# Patient Record
Sex: Female | Born: 1950 | Race: White | Hispanic: No | Smoking: Former smoker
Health system: Southern US, Community
[De-identification: ages and names within clinical notes are randomized; demographics above are authoritative.]

## PROBLEM LIST (undated history)

## (undated) DIAGNOSIS — E119 Type 2 diabetes mellitus without complications: Secondary | ICD-10-CM

## (undated) DIAGNOSIS — F419 Anxiety disorder, unspecified: Secondary | ICD-10-CM

## (undated) DIAGNOSIS — C140 Malignant neoplasm of pharynx, unspecified: Secondary | ICD-10-CM

## (undated) DIAGNOSIS — E7800 Pure hypercholesterolemia, unspecified: Secondary | ICD-10-CM

## (undated) DIAGNOSIS — M1711 Unilateral primary osteoarthritis, right knee: Secondary | ICD-10-CM

## (undated) DIAGNOSIS — I10 Essential (primary) hypertension: Secondary | ICD-10-CM

## (undated) DIAGNOSIS — E785 Hyperlipidemia, unspecified: Secondary | ICD-10-CM

## (undated) DIAGNOSIS — N200 Calculus of kidney: Secondary | ICD-10-CM

## (undated) DIAGNOSIS — M79643 Pain in unspecified hand: Secondary | ICD-10-CM

## (undated) DIAGNOSIS — K589 Irritable bowel syndrome without diarrhea: Secondary | ICD-10-CM

## (undated) DIAGNOSIS — F329 Major depressive disorder, single episode, unspecified: Secondary | ICD-10-CM

## (undated) DIAGNOSIS — B029 Zoster without complications: Secondary | ICD-10-CM

## (undated) DIAGNOSIS — F32A Depression, unspecified: Secondary | ICD-10-CM

## (undated) DIAGNOSIS — K5790 Diverticulosis of intestine, part unspecified, without perforation or abscess without bleeding: Secondary | ICD-10-CM

## (undated) DIAGNOSIS — J449 Chronic obstructive pulmonary disease, unspecified: Secondary | ICD-10-CM

## (undated) HISTORY — DX: Unilateral primary osteoarthritis, right knee: M17.11

## (undated) HISTORY — PX: CARPAL TUNNEL RELEASE: SHX101

## (undated) HISTORY — DX: Hyperlipidemia, unspecified: E78.5

## (undated) HISTORY — DX: Pain in unspecified hand: M79.643

## (undated) HISTORY — DX: Malignant neoplasm of pharynx, unspecified: C14.0

## (undated) HISTORY — PX: THROAT SURGERY: SHX803

## (undated) HISTORY — DX: Depression, unspecified: F32.A

## (undated) HISTORY — PX: JEJUNOSTOMY FEEDING TUBE: SUR737

## (undated) HISTORY — PX: TOTAL ABDOMINAL HYSTERECTOMY: SHX209

## (undated) HISTORY — PX: CHOLECYSTECTOMY: SHX55

## (undated) HISTORY — DX: Major depressive disorder, single episode, unspecified: F32.9

## (undated) HISTORY — DX: Type 2 diabetes mellitus without complications: E11.9

---

## 2001-12-22 DIAGNOSIS — C140 Malignant neoplasm of pharynx, unspecified: Secondary | ICD-10-CM

## 2001-12-22 HISTORY — DX: Malignant neoplasm of pharynx, unspecified: C14.0

## 2004-07-25 ENCOUNTER — Ambulatory Visit (HOSPITAL_COMMUNITY): Admission: RE | Admit: 2004-07-25 | Discharge: 2004-07-25 | Payer: Self-pay | Admitting: Family Medicine

## 2004-11-05 ENCOUNTER — Ambulatory Visit: Payer: Self-pay | Admitting: Family Medicine

## 2004-11-18 ENCOUNTER — Ambulatory Visit: Payer: Self-pay | Admitting: Family Medicine

## 2004-11-19 ENCOUNTER — Ambulatory Visit (HOSPITAL_COMMUNITY): Admission: RE | Admit: 2004-11-19 | Discharge: 2004-11-19 | Payer: Self-pay | Admitting: Family Medicine

## 2004-12-09 ENCOUNTER — Ambulatory Visit: Payer: Self-pay | Admitting: Family Medicine

## 2005-01-28 ENCOUNTER — Ambulatory Visit: Payer: Self-pay | Admitting: Family Medicine

## 2005-03-11 ENCOUNTER — Ambulatory Visit: Payer: Self-pay | Admitting: Family Medicine

## 2005-04-07 ENCOUNTER — Ambulatory Visit: Payer: Self-pay | Admitting: Family Medicine

## 2005-04-24 ENCOUNTER — Ambulatory Visit: Payer: Self-pay | Admitting: Family Medicine

## 2005-05-28 ENCOUNTER — Ambulatory Visit: Payer: Self-pay | Admitting: Internal Medicine

## 2005-05-29 ENCOUNTER — Ambulatory Visit: Payer: Self-pay | Admitting: Family Medicine

## 2005-06-18 ENCOUNTER — Ambulatory Visit (HOSPITAL_COMMUNITY): Admission: RE | Admit: 2005-06-18 | Discharge: 2005-06-18 | Payer: Self-pay | Admitting: Internal Medicine

## 2005-06-18 ENCOUNTER — Ambulatory Visit: Payer: Self-pay | Admitting: Internal Medicine

## 2005-06-18 ENCOUNTER — Encounter (INDEPENDENT_AMBULATORY_CARE_PROVIDER_SITE_OTHER): Payer: Self-pay | Admitting: Internal Medicine

## 2005-08-26 ENCOUNTER — Ambulatory Visit: Payer: Self-pay | Admitting: Family Medicine

## 2005-10-06 ENCOUNTER — Ambulatory Visit: Payer: Self-pay | Admitting: Family Medicine

## 2006-01-20 ENCOUNTER — Ambulatory Visit: Payer: Self-pay | Admitting: Family Medicine

## 2006-04-29 ENCOUNTER — Ambulatory Visit: Payer: Self-pay | Admitting: Family Medicine

## 2006-05-06 ENCOUNTER — Ambulatory Visit: Payer: Self-pay | Admitting: Family Medicine

## 2006-06-18 ENCOUNTER — Ambulatory Visit: Payer: Self-pay | Admitting: Family Medicine

## 2006-09-24 ENCOUNTER — Ambulatory Visit: Payer: Self-pay | Admitting: Family Medicine

## 2007-01-18 ENCOUNTER — Encounter: Payer: Self-pay | Admitting: Family Medicine

## 2007-01-18 LAB — CONVERTED CEMR LAB
ALT: 14 units/L (ref 0–35)
AST: 19 units/L (ref 0–37)
Albumin: 4.3 g/dL (ref 3.5–5.2)
Alkaline Phosphatase: 48 units/L (ref 39–117)
BUN: 14 mg/dL (ref 6–23)
Basophils Absolute: 0.1 10*3/uL (ref 0.0–0.1)
Basophils Relative: 1 % (ref 0–1)
Bilirubin, Direct: 0.1 mg/dL (ref 0.0–0.3)
CO2: 29 meq/L (ref 19–32)
Calcium: 9.4 mg/dL (ref 8.4–10.5)
Chloride: 104 meq/L (ref 96–112)
Cholesterol: 159 mg/dL (ref 0–200)
Creatinine, Ser: 0.69 mg/dL (ref 0.40–1.20)
Eosinophils Absolute: 0.6 10*3/uL (ref 0.0–0.7)
Eosinophils Relative: 11 % — ABNORMAL HIGH (ref 0–5)
Glucose, Bld: 86 mg/dL (ref 70–99)
HCT: 45.5 % (ref 36.0–46.0)
HDL: 51 mg/dL (ref >39)
Hemoglobin: 14.9 g/dL (ref 12.0–15.0)
Hgb A1c MFr Bld: 5.5 % (ref 4.6–6.1)
Indirect Bilirubin: 0.3 mg/dL (ref 0.0–0.9)
LDL Cholesterol: 50 mg/dL (ref 0–99)
Lymphocytes Relative: 22 % (ref 12–46)
Lymphs Abs: 1.2 10*3/uL (ref 0.7–3.3)
MCHC: 32.7 g/dL (ref 30.0–36.0)
MCV: 87.8 fL (ref 78.0–100.0)
Monocytes Absolute: 0.5 10*3/uL (ref 0.2–0.7)
Monocytes Relative: 9 % (ref 3–11)
Neutro Abs: 3.1 10*3/uL (ref 1.7–7.7)
Neutrophils Relative %: 57 % (ref 43–77)
Platelets: 240 10*3/uL (ref 150–400)
Potassium: 4.6 meq/L (ref 3.5–5.3)
RBC: 5.18 M/uL — ABNORMAL HIGH (ref 3.87–5.11)
RDW: 14.7 % — ABNORMAL HIGH (ref 11.5–14.0)
Sodium: 142 meq/L (ref 135–145)
Total Bilirubin: 0.4 mg/dL (ref 0.3–1.2)
Total CHOL/HDL Ratio: 3.1
Total Protein: 6.6 g/dL (ref 6.0–8.3)
Triglycerides: 291 mg/dL — ABNORMAL HIGH (ref <150)
VLDL: 58 mg/dL — ABNORMAL HIGH (ref 0–40)
WBC: 5.5 10*3/uL (ref 4.0–10.5)

## 2007-01-26 ENCOUNTER — Ambulatory Visit: Payer: Self-pay | Admitting: Family Medicine

## 2007-01-26 LAB — CONVERTED CEMR LAB: Microalb, Ur: 0.2 mg/dL (ref 0.00–1.89)

## 2007-02-01 ENCOUNTER — Ambulatory Visit (HOSPITAL_COMMUNITY): Admission: RE | Admit: 2007-02-01 | Discharge: 2007-02-01 | Payer: Self-pay | Admitting: Family Medicine

## 2007-02-04 ENCOUNTER — Encounter: Payer: Self-pay | Admitting: Family Medicine

## 2007-02-04 LAB — CONVERTED CEMR LAB: Creatinine, Ser: 0.72 mg/dL (ref 0.40–1.20)

## 2007-03-18 ENCOUNTER — Ambulatory Visit: Payer: Self-pay | Admitting: Family Medicine

## 2007-04-06 ENCOUNTER — Ambulatory Visit (HOSPITAL_COMMUNITY): Admission: RE | Admit: 2007-04-06 | Discharge: 2007-04-06 | Payer: Self-pay | Admitting: Family Medicine

## 2007-04-26 ENCOUNTER — Encounter: Payer: Self-pay | Admitting: Family Medicine

## 2007-04-26 LAB — CONVERTED CEMR LAB: Hgb A1c MFr Bld: 6 % (ref 4.6–6.1)

## 2007-06-10 ENCOUNTER — Encounter: Payer: Self-pay | Admitting: Family Medicine

## 2007-06-10 ENCOUNTER — Other Ambulatory Visit: Admission: RE | Admit: 2007-06-10 | Discharge: 2007-06-10 | Payer: Self-pay | Admitting: Family Medicine

## 2007-06-10 ENCOUNTER — Ambulatory Visit: Payer: Self-pay | Admitting: Family Medicine

## 2007-06-10 LAB — CONVERTED CEMR LAB: Pap Smear: NORMAL

## 2007-07-26 ENCOUNTER — Ambulatory Visit (HOSPITAL_COMMUNITY): Admission: RE | Admit: 2007-07-26 | Discharge: 2007-07-26 | Payer: Self-pay | Admitting: Family Medicine

## 2007-08-16 ENCOUNTER — Encounter: Payer: Self-pay | Admitting: Family Medicine

## 2007-08-16 LAB — CONVERTED CEMR LAB
ALT: 15 units/L (ref 0–35)
AST: 17 units/L (ref 0–37)
Albumin: 4.2 g/dL (ref 3.5–5.2)
Alkaline Phosphatase: 40 units/L (ref 39–117)
BUN: 10 mg/dL (ref 6–23)
Bilirubin, Direct: 0.1 mg/dL (ref 0.0–0.3)
CO2: 30 meq/L (ref 19–32)
Calcium: 9.7 mg/dL (ref 8.4–10.5)
Chloride: 104 meq/L (ref 96–112)
Cholesterol: 155 mg/dL (ref 0–200)
Creatinine, Ser: 0.76 mg/dL (ref 0.40–1.20)
Glucose, Bld: 98 mg/dL (ref 70–99)
HDL: 54 mg/dL (ref >39)
Indirect Bilirubin: 0.2 mg/dL (ref 0.0–0.9)
LDL Cholesterol: 48 mg/dL (ref 0–99)
Potassium: 3.9 meq/L (ref 3.5–5.3)
Sodium: 143 meq/L (ref 135–145)
Total Bilirubin: 0.3 mg/dL (ref 0.3–1.2)
Total CHOL/HDL Ratio: 2.9
Total Protein: 6.4 g/dL (ref 6.0–8.3)
Triglycerides: 266 mg/dL — ABNORMAL HIGH (ref <150)
VLDL: 53 mg/dL — ABNORMAL HIGH (ref 0–40)

## 2007-09-29 ENCOUNTER — Ambulatory Visit: Payer: Self-pay | Admitting: Family Medicine

## 2007-11-16 ENCOUNTER — Ambulatory Visit: Payer: Self-pay | Admitting: Family Medicine

## 2007-12-23 ENCOUNTER — Encounter: Payer: Self-pay | Admitting: Family Medicine

## 2008-01-07 ENCOUNTER — Ambulatory Visit: Payer: Self-pay | Admitting: Family Medicine

## 2008-01-07 LAB — CONVERTED CEMR LAB
ALT: 17 units/L (ref 0–35)
AST: 20 units/L (ref 0–37)
Albumin: 4.4 g/dL (ref 3.5–5.2)
Alkaline Phosphatase: 40 units/L (ref 39–117)
BUN: 11 mg/dL (ref 6–23)
Basophils Absolute: 0.1 10*3/uL (ref 0.0–0.1)
Basophils Relative: 1 % (ref 0–1)
Bilirubin, Direct: 0.1 mg/dL (ref 0.0–0.3)
CO2: 28 meq/L (ref 19–32)
Calcium: 9.6 mg/dL (ref 8.4–10.5)
Chloride: 99 meq/L (ref 96–112)
Cholesterol: 161 mg/dL (ref 0–200)
Creatinine, Ser: 0.82 mg/dL (ref 0.40–1.20)
Eosinophils Absolute: 0.8 10*3/uL — ABNORMAL HIGH (ref 0.0–0.7)
Eosinophils Relative: 14 % — ABNORMAL HIGH (ref 0–5)
Glucose, Bld: 100 mg/dL — ABNORMAL HIGH (ref 70–99)
HCT: 44.4 % (ref 36.0–46.0)
HDL: 55 mg/dL (ref >39)
Hemoglobin: 15.2 g/dL — ABNORMAL HIGH (ref 12.0–15.0)
Indirect Bilirubin: 0.2 mg/dL (ref 0.0–0.9)
LDL Cholesterol: 46 mg/dL (ref 0–99)
Lymphocytes Relative: 27 % (ref 12–46)
Lymphs Abs: 1.5 10*3/uL (ref 0.7–4.0)
MCHC: 34.2 g/dL (ref 30.0–36.0)
MCV: 88.6 fL (ref 78.0–100.0)
Monocytes Absolute: 0.6 10*3/uL (ref 0.1–1.0)
Monocytes Relative: 11 % (ref 3–12)
Neutro Abs: 2.6 10*3/uL (ref 1.7–7.7)
Neutrophils Relative %: 47 % (ref 43–77)
Platelets: 209 10*3/uL (ref 150–400)
Potassium: 4 meq/L (ref 3.5–5.3)
RBC: 5.01 M/uL (ref 3.87–5.11)
RDW: 13.9 % (ref 11.5–15.5)
Sodium: 139 meq/L (ref 135–145)
TSH: 5.038 microintl units/mL (ref 0.350–5.50)
Total Bilirubin: 0.3 mg/dL (ref 0.3–1.2)
Total CHOL/HDL Ratio: 2.9
Total Protein: 6.8 g/dL (ref 6.0–8.3)
Triglycerides: 301 mg/dL — ABNORMAL HIGH (ref <150)
VLDL: 60 mg/dL — ABNORMAL HIGH (ref 0–40)
WBC: 5.6 10*3/uL (ref 4.0–10.5)

## 2008-02-24 ENCOUNTER — Ambulatory Visit: Payer: Self-pay | Admitting: Family Medicine

## 2008-04-06 ENCOUNTER — Ambulatory Visit: Payer: Self-pay | Admitting: Family Medicine

## 2008-04-06 DIAGNOSIS — IMO0002 Reserved for concepts with insufficient information to code with codable children: Secondary | ICD-10-CM

## 2008-04-06 DIAGNOSIS — M79609 Pain in unspecified limb: Secondary | ICD-10-CM

## 2008-04-06 DIAGNOSIS — E785 Hyperlipidemia, unspecified: Secondary | ICD-10-CM

## 2008-04-06 DIAGNOSIS — M1710 Unilateral primary osteoarthritis, unspecified knee: Secondary | ICD-10-CM | POA: Insufficient documentation

## 2008-04-06 DIAGNOSIS — H9190 Unspecified hearing loss, unspecified ear: Secondary | ICD-10-CM | POA: Insufficient documentation

## 2008-04-06 DIAGNOSIS — K5190 Ulcerative colitis, unspecified, without complications: Secondary | ICD-10-CM | POA: Insufficient documentation

## 2008-04-06 DIAGNOSIS — C140 Malignant neoplasm of pharynx, unspecified: Secondary | ICD-10-CM

## 2008-04-06 DIAGNOSIS — F329 Major depressive disorder, single episode, unspecified: Secondary | ICD-10-CM

## 2008-04-06 DIAGNOSIS — I10 Essential (primary) hypertension: Secondary | ICD-10-CM | POA: Insufficient documentation

## 2008-04-06 DIAGNOSIS — E119 Type 2 diabetes mellitus without complications: Secondary | ICD-10-CM

## 2008-04-06 LAB — CONVERTED CEMR LAB
ALT: 16 units/L (ref 0–35)
AST: 20 units/L (ref 0–37)
Albumin: 4.4 g/dL (ref 3.5–5.2)
Alkaline Phosphatase: 39 units/L (ref 39–117)
BUN: 11 mg/dL (ref 6–23)
Bilirubin, Direct: 0.1 mg/dL (ref 0.0–0.3)
CO2: 27 meq/L (ref 19–32)
Calcium: 9.6 mg/dL (ref 8.4–10.5)
Chloride: 103 meq/L (ref 96–112)
Cholesterol: 160 mg/dL (ref 0–200)
Creatinine, Ser: 0.9 mg/dL (ref 0.40–1.20)
Glucose, Bld: 95 mg/dL (ref 70–99)
HDL: 64 mg/dL (ref >39)
Indirect Bilirubin: 0.2 mg/dL (ref 0.0–0.9)
LDL Cholesterol: 55 mg/dL (ref 0–99)
Potassium: 4.2 meq/L (ref 3.5–5.3)
Sodium: 143 meq/L (ref 135–145)
Total Bilirubin: 0.3 mg/dL (ref 0.3–1.2)
Total CHOL/HDL Ratio: 2.5
Total Protein: 7 g/dL (ref 6.0–8.3)
Triglycerides: 205 mg/dL — ABNORMAL HIGH (ref <150)
VLDL: 41 mg/dL — ABNORMAL HIGH (ref 0–40)

## 2008-04-14 ENCOUNTER — Ambulatory Visit (HOSPITAL_COMMUNITY): Admission: RE | Admit: 2008-04-14 | Discharge: 2008-04-14 | Payer: Self-pay | Admitting: Family Medicine

## 2008-04-14 ENCOUNTER — Encounter: Payer: Self-pay | Admitting: Family Medicine

## 2008-04-14 ENCOUNTER — Ambulatory Visit: Payer: Self-pay | Admitting: Family Medicine

## 2008-04-20 ENCOUNTER — Ambulatory Visit (HOSPITAL_COMMUNITY): Admission: RE | Admit: 2008-04-20 | Discharge: 2008-04-20 | Payer: Self-pay | Admitting: Family Medicine

## 2008-04-27 ENCOUNTER — Ambulatory Visit (HOSPITAL_COMMUNITY): Admission: RE | Admit: 2008-04-27 | Discharge: 2008-04-27 | Payer: Self-pay | Admitting: Family Medicine

## 2008-07-06 ENCOUNTER — Ambulatory Visit: Payer: Self-pay | Admitting: Family Medicine

## 2008-07-06 LAB — CONVERTED CEMR LAB
ALT: 18 units/L (ref 0–35)
AST: 19 units/L (ref 0–37)
Albumin: 4.7 g/dL (ref 3.5–5.2)
Alkaline Phosphatase: 41 units/L (ref 39–117)
Bilirubin, Direct: 0.1 mg/dL (ref 0.0–0.3)
Cholesterol: 193 mg/dL (ref 0–200)
HDL: 60 mg/dL (ref >39)
Indirect Bilirubin: 0.3 mg/dL (ref 0.0–0.9)
LDL Cholesterol: 82 mg/dL (ref 0–99)
Total Bilirubin: 0.4 mg/dL (ref 0.3–1.2)
Total CHOL/HDL Ratio: 3.2
Total Protein: 7.1 g/dL (ref 6.0–8.3)
Triglycerides: 253 mg/dL — ABNORMAL HIGH (ref <150)
VLDL: 51 mg/dL — ABNORMAL HIGH (ref 0–40)

## 2008-07-27 ENCOUNTER — Ambulatory Visit (HOSPITAL_COMMUNITY): Admission: RE | Admit: 2008-07-27 | Discharge: 2008-07-27 | Payer: Self-pay | Admitting: Family Medicine

## 2008-08-22 ENCOUNTER — Encounter: Payer: Self-pay | Admitting: Family Medicine

## 2008-08-22 LAB — HM COLONOSCOPY: HM Colonoscopy: ABNORMAL

## 2008-09-01 ENCOUNTER — Encounter: Payer: Self-pay | Admitting: Family Medicine

## 2008-09-05 ENCOUNTER — Telehealth: Payer: Self-pay | Admitting: Family Medicine

## 2008-09-05 ENCOUNTER — Encounter: Payer: Self-pay | Admitting: Family Medicine

## 2008-09-13 ENCOUNTER — Encounter: Payer: Self-pay | Admitting: Family Medicine

## 2008-10-04 ENCOUNTER — Encounter: Payer: Self-pay | Admitting: Family Medicine

## 2008-10-09 ENCOUNTER — Ambulatory Visit: Payer: Self-pay | Admitting: Family Medicine

## 2008-10-09 DIAGNOSIS — R5381 Other malaise: Secondary | ICD-10-CM | POA: Insufficient documentation

## 2008-10-09 DIAGNOSIS — M25569 Pain in unspecified knee: Secondary | ICD-10-CM

## 2008-10-09 DIAGNOSIS — R5383 Other fatigue: Secondary | ICD-10-CM

## 2008-10-09 LAB — CONVERTED CEMR LAB
Glucose, Bld: 96 mg/dL
Hgb A1c MFr Bld: 5.7 %

## 2008-10-24 ENCOUNTER — Encounter: Payer: Self-pay | Admitting: Family Medicine

## 2008-12-11 ENCOUNTER — Telehealth: Payer: Self-pay | Admitting: Family Medicine

## 2009-02-07 ENCOUNTER — Encounter: Payer: Self-pay | Admitting: Family Medicine

## 2009-02-07 LAB — CONVERTED CEMR LAB
AST: 24 units/L (ref 0–37)
Albumin: 4.3 g/dL (ref 3.5–5.2)
Alkaline Phosphatase: 37 units/L — ABNORMAL LOW (ref 39–117)
BUN: 16 mg/dL (ref 6–23)
Bilirubin, Direct: 0.1 mg/dL (ref 0.0–0.3)
CO2: 24 meq/L (ref 19–32)
Calcium: 9.6 mg/dL (ref 8.4–10.5)
Chloride: 105 meq/L (ref 96–112)
Creatinine, Ser: 0.83 mg/dL (ref 0.40–1.20)
Glucose, Bld: 99 mg/dL (ref 70–99)
Indirect Bilirubin: 0.2 mg/dL (ref 0.0–0.9)
LDL Cholesterol: 61 mg/dL (ref 0–99)
Total Bilirubin: 0.3 mg/dL (ref 0.3–1.2)

## 2009-02-12 ENCOUNTER — Ambulatory Visit: Payer: Self-pay | Admitting: Family Medicine

## 2009-03-01 ENCOUNTER — Telehealth: Payer: Self-pay | Admitting: Family Medicine

## 2009-03-05 ENCOUNTER — Telehealth: Payer: Self-pay | Admitting: Family Medicine

## 2009-04-23 ENCOUNTER — Telehealth: Payer: Self-pay | Admitting: Family Medicine

## 2009-06-01 ENCOUNTER — Telehealth: Payer: Self-pay | Admitting: Family Medicine

## 2009-06-04 ENCOUNTER — Telehealth: Payer: Self-pay | Admitting: Family Medicine

## 2009-06-05 ENCOUNTER — Encounter: Payer: Self-pay | Admitting: Family Medicine

## 2009-06-06 ENCOUNTER — Telehealth: Payer: Self-pay | Admitting: Family Medicine

## 2009-06-12 ENCOUNTER — Ambulatory Visit: Payer: Self-pay | Admitting: Family Medicine

## 2009-06-12 LAB — CONVERTED CEMR LAB
Blood Glucose, Fasting: 124 mg/dL
Hgb A1c MFr Bld: 6 %

## 2009-06-13 ENCOUNTER — Telehealth: Payer: Self-pay | Admitting: Family Medicine

## 2009-06-13 LAB — CONVERTED CEMR LAB
ALT: 14 units/L (ref 0–35)
Basophils Absolute: 0.1 10*3/uL (ref 0.0–0.1)
Bilirubin, Direct: 0.1 mg/dL (ref 0.0–0.3)
Chloride: 103 meq/L (ref 96–112)
Cholesterol: 131 mg/dL (ref 0–200)
Glucose, Bld: 116 mg/dL — ABNORMAL HIGH (ref 70–99)
Hemoglobin: 13.5 g/dL (ref 12.0–15.0)
Lymphocytes Relative: 22 % (ref 12–46)
Lymphs Abs: 1.3 10*3/uL (ref 0.7–4.0)
Monocytes Absolute: 0.7 10*3/uL (ref 0.1–1.0)
Neutro Abs: 3.1 10*3/uL (ref 1.7–7.7)
Potassium: 4.3 meq/L (ref 3.5–5.3)
RBC: 4.62 M/uL (ref 3.87–5.11)
RDW: 14.2 % (ref 11.5–15.5)
Sodium: 142 meq/L (ref 135–145)
TSH: 4.894 microintl units/mL — ABNORMAL HIGH (ref 0.350–4.500)
Total CHOL/HDL Ratio: 2.5
Total Protein: 6.9 g/dL (ref 6.0–8.3)
Triglycerides: 197 mg/dL — ABNORMAL HIGH (ref <150)
VLDL: 39 mg/dL (ref 0–40)
WBC: 5.8 10*3/uL (ref 4.0–10.5)

## 2009-06-14 ENCOUNTER — Telehealth: Payer: Self-pay | Admitting: Family Medicine

## 2009-06-19 ENCOUNTER — Encounter (HOSPITAL_COMMUNITY): Admission: RE | Admit: 2009-06-19 | Discharge: 2009-07-19 | Payer: Self-pay | Admitting: Family Medicine

## 2009-06-20 ENCOUNTER — Telehealth: Payer: Self-pay | Admitting: Family Medicine

## 2009-07-04 ENCOUNTER — Telehealth: Payer: Self-pay | Admitting: Family Medicine

## 2009-07-13 ENCOUNTER — Ambulatory Visit: Payer: Self-pay | Admitting: Family Medicine

## 2009-07-13 DIAGNOSIS — R42 Dizziness and giddiness: Secondary | ICD-10-CM

## 2009-07-13 DIAGNOSIS — R259 Unspecified abnormal involuntary movements: Secondary | ICD-10-CM | POA: Insufficient documentation

## 2009-07-13 DIAGNOSIS — R0989 Other specified symptoms and signs involving the circulatory and respiratory systems: Secondary | ICD-10-CM

## 2009-07-26 ENCOUNTER — Telehealth: Payer: Self-pay | Admitting: Family Medicine

## 2009-08-20 ENCOUNTER — Ambulatory Visit (HOSPITAL_COMMUNITY): Admission: RE | Admit: 2009-08-20 | Discharge: 2009-08-20 | Payer: Self-pay | Admitting: Family Medicine

## 2009-08-22 ENCOUNTER — Encounter: Payer: Self-pay | Admitting: Family Medicine

## 2009-08-28 ENCOUNTER — Telehealth: Payer: Self-pay | Admitting: Family Medicine

## 2009-09-06 ENCOUNTER — Encounter: Payer: Self-pay | Admitting: Family Medicine

## 2009-10-04 ENCOUNTER — Ambulatory Visit: Payer: Self-pay | Admitting: Family Medicine

## 2009-10-04 DIAGNOSIS — H60339 Swimmer's ear, unspecified ear: Secondary | ICD-10-CM | POA: Insufficient documentation

## 2009-10-04 DIAGNOSIS — E049 Nontoxic goiter, unspecified: Secondary | ICD-10-CM | POA: Insufficient documentation

## 2009-10-04 LAB — CONVERTED CEMR LAB: Hgb A1c MFr Bld: 5.8 %

## 2009-10-05 ENCOUNTER — Telehealth: Payer: Self-pay | Admitting: Family Medicine

## 2009-10-05 LAB — CONVERTED CEMR LAB
ALT: 17 units/L (ref 0–35)
Alkaline Phosphatase: 37 units/L — ABNORMAL LOW (ref 39–117)
BUN: 10 mg/dL (ref 6–23)
Calcium: 9.5 mg/dL (ref 8.4–10.5)
Cholesterol: 148 mg/dL (ref 0–200)
Creatinine, Ser: 0.87 mg/dL (ref 0.40–1.20)
Indirect Bilirubin: 0.2 mg/dL (ref 0.0–0.9)
Total Protein: 6.5 g/dL (ref 6.0–8.3)
Triglycerides: 173 mg/dL — ABNORMAL HIGH (ref <150)

## 2009-10-10 ENCOUNTER — Ambulatory Visit (HOSPITAL_COMMUNITY): Admission: RE | Admit: 2009-10-10 | Discharge: 2009-10-10 | Payer: Self-pay | Admitting: Family Medicine

## 2009-11-02 ENCOUNTER — Encounter: Payer: Self-pay | Admitting: Family Medicine

## 2010-02-04 ENCOUNTER — Ambulatory Visit: Payer: Self-pay | Admitting: Family Medicine

## 2010-02-04 LAB — CONVERTED CEMR LAB: Blood Glucose, Fasting: 109 mg/dL

## 2010-02-05 ENCOUNTER — Encounter: Payer: Self-pay | Admitting: Family Medicine

## 2010-04-30 ENCOUNTER — Telehealth: Payer: Self-pay | Admitting: Physician Assistant

## 2010-05-28 ENCOUNTER — Encounter: Payer: Self-pay | Admitting: Family Medicine

## 2010-05-28 LAB — CONVERTED CEMR LAB
Albumin: 4.3 g/dL (ref 3.5–5.2)
BUN: 10 mg/dL (ref 6–23)
Chloride: 104 meq/L (ref 96–112)
Eosinophils Relative: 13 % — ABNORMAL HIGH (ref 0–5)
HCT: 44.5 % (ref 36.0–46.0)
HDL: 65 mg/dL (ref >39)
Hemoglobin: 14.3 g/dL (ref 12.0–15.0)
Hgb A1c MFr Bld: 6.1 % — ABNORMAL HIGH (ref <5.7)
Indirect Bilirubin: 0.2 mg/dL (ref 0.0–0.9)
LDL Cholesterol: 55 mg/dL (ref 0–99)
Lymphocytes Relative: 28 % (ref 12–46)
Lymphs Abs: 1.6 10*3/uL (ref 0.7–4.0)
Microalb Creat Ratio: 4.5 mg/g (ref 0.0–30.0)
Platelets: 224 10*3/uL (ref 150–400)
Potassium: 4.2 meq/L (ref 3.5–5.3)
Total CHOL/HDL Ratio: 2.3
Total Protein: 6.4 g/dL (ref 6.0–8.3)
Triglycerides: 160 mg/dL — ABNORMAL HIGH (ref <150)
VLDL: 32 mg/dL (ref 0–40)
WBC: 5.5 10*3/uL (ref 4.0–10.5)

## 2010-06-18 ENCOUNTER — Ambulatory Visit: Payer: Self-pay | Admitting: Family Medicine

## 2010-06-18 DIAGNOSIS — R209 Unspecified disturbances of skin sensation: Secondary | ICD-10-CM

## 2010-08-16 ENCOUNTER — Telehealth: Payer: Self-pay | Admitting: Family Medicine

## 2010-08-20 ENCOUNTER — Ambulatory Visit: Payer: Self-pay | Admitting: Family Medicine

## 2010-08-20 ENCOUNTER — Telehealth: Payer: Self-pay | Admitting: Family Medicine

## 2010-08-20 DIAGNOSIS — M62838 Other muscle spasm: Secondary | ICD-10-CM

## 2010-08-21 ENCOUNTER — Telehealth: Payer: Self-pay | Admitting: Family Medicine

## 2010-08-22 ENCOUNTER — Ambulatory Visit (HOSPITAL_COMMUNITY): Admission: RE | Admit: 2010-08-22 | Discharge: 2010-08-22 | Payer: Self-pay | Admitting: Family Medicine

## 2010-09-04 ENCOUNTER — Telehealth: Payer: Self-pay | Admitting: Family Medicine

## 2010-09-06 ENCOUNTER — Encounter: Payer: Self-pay | Admitting: Family Medicine

## 2010-09-10 ENCOUNTER — Encounter: Payer: Self-pay | Admitting: Family Medicine

## 2010-09-30 ENCOUNTER — Encounter: Payer: Self-pay | Admitting: Family Medicine

## 2010-10-01 LAB — CONVERTED CEMR LAB
Hgb A1c MFr Bld: 5.8 % — ABNORMAL HIGH (ref <5.7)
Potassium: 4.4 meq/L (ref 3.5–5.3)
Sodium: 142 meq/L (ref 135–145)

## 2010-10-14 ENCOUNTER — Ambulatory Visit: Payer: Self-pay | Admitting: Family Medicine

## 2010-10-14 DIAGNOSIS — R110 Nausea: Secondary | ICD-10-CM | POA: Insufficient documentation

## 2010-10-15 LAB — CONVERTED CEMR LAB
ALT: 19 units/L (ref 0–35)
AST: 21 units/L (ref 0–37)
Albumin: 4.5 g/dL (ref 3.5–5.2)
Alkaline Phosphatase: 41 units/L (ref 39–117)
Cholesterol: 169 mg/dL (ref 0–200)
HDL: 65 mg/dL (ref >39)
TSH: 4.05 microintl units/mL (ref 0.350–4.500)
Total CHOL/HDL Ratio: 2.6

## 2010-11-21 HISTORY — PX: OTHER SURGICAL HISTORY: SHX169

## 2010-12-18 ENCOUNTER — Telehealth: Payer: Self-pay | Admitting: Family Medicine

## 2010-12-19 ENCOUNTER — Telehealth: Payer: Self-pay | Admitting: Family Medicine

## 2011-01-02 LAB — CONVERTED CEMR LAB
BUN: 13 mg/dL (ref 6–23)
Creatinine, Ser: 0.86 mg/dL (ref 0.40–1.20)
Hgb A1c MFr Bld: 6 % — ABNORMAL HIGH (ref <5.7)

## 2011-01-09 ENCOUNTER — Ambulatory Visit
Admission: RE | Admit: 2011-01-09 | Discharge: 2011-01-09 | Payer: Self-pay | Source: Home / Self Care | Attending: Family Medicine | Admitting: Family Medicine

## 2011-01-21 NOTE — Miscellaneous (Signed)
  Clinical Lists Changes  Medications: Removed medication of METFORMIN HCL 500 MG  TABS (METFORMIN HCL) Take 1 tablet by mouth once a day Removed medication of SIMVASTATIN 80 MG  TABS (SIMVASTATIN) Take 1 tab by mouth at bedtime Added new medication of SIMVASTATIN 40 MG TABS (SIMVASTATIN) Take 1 tab by mouth at bedtime - Signed Rx of SIMVASTATIN 40 MG TABS (SIMVASTATIN) Take 1 tab by mouth at bedtime;  #30 x 3;  Signed;  Entered by: Syliva Overman MD;  Authorized by: Syliva Overman MD;  Method used: Historical    Prescriptions: SIMVASTATIN 40 MG TABS (SIMVASTATIN) Take 1 tab by mouth at bedtime  #30 x 3   Entered and Authorized by:   Syliva Overman MD   Signed by:   Syliva Overman MD on 09/30/2010   Method used:   Historical   RxID:   [id number]

## 2011-01-21 NOTE — Letter (Signed)
Summary: xray  xray   Imported By: Curtis Sites 06/12/2010 12:01:59  _____________________________________________________________________  External Attachment:    Type:   Image     Comment:   External Document

## 2011-01-21 NOTE — Letter (Signed)
Summary: phone notes  phone notes   Imported By: Curtis Sites 06/12/2010 12:01:18  _____________________________________________________________________  External Attachment:    Type:   Image     Comment:   External Document

## 2011-01-21 NOTE — Assessment & Plan Note (Signed)
Summary: office visit   Vital Signs:  Patient profile:   60 year old female Menstrual status:  hysterectomy Height:      58.5 inches Weight:      211.75 pounds BMI:     43.66 O2 Sat:      95 % on Room air Pulse rate:   80 / minute Pulse rhythm:   regular Resp:     16 per minute BP sitting:   140 / 90  (left arm)  Vitals Entered By: Adella Hare LPN (August 20, 2010 8:40 AM)  Nutrition Counseling: Patient's BMI is greater than 25 and therefore counseled on weight management options.  O2 Flow:  Room air CC: follow up  Is Patient Diabetic? Yes Pain Assessment Patient in pain? no        CC:  follow up .  History of Present Illness: Reports  that she has been dpoing fairly well, except for increased nocturnal leg cramps. Denies recent fever or chills. Denies sinus pressure, nasal congestion , ear pain or sore throat. Denies chest congestion, or cough productive of sputum. Denies chest pain, palpitations, PND, orthopnea or leg swelling. Denies abdominal pain, nausea, vomitting,  or constipation. Denies change in bowel movements she has chronic diarreah Denies dysuria , frequency, incontinence or hesitancy. Denies  joint pain, swelling, or reduced mobility. Denies headaches, vertigo, seizures. Denies depression, anxiety or insomnia.Controlled on meds Denies  rash, lesions, or itch.     Current Medications (verified): 1)  Clonidine Hcl 0.1 Mg  Tabs (Clonidine Hcl) .... Take 1 Tablet By Mouth Once A Day 2)  Lasix 20 Mg  Tabs (Furosemide) .... Take 1 Tablet By Mouth Once A Day 3)  Klor-Con M20 20 Meq  Tbcr (Potassium Chloride Crys Cr) .... Take 1 Tablet By Mouth Two Times A Day 4)  Trazodone Hcl 100 Mg  Tabs (Trazodone Hcl) .... Take Three Tablets By Mouth At Bedtime 5)  Simvastatin 80 Mg  Tabs (Simvastatin) .... Take 1 Tab By Mouth At Bedtime 6)  Metformin Hcl 500 Mg  Tabs (Metformin Hcl) .... Take 1 Tablet By Mouth Once A Day 7)  Multi-Vitamin   Tabs (Multiple Vitamin)  .... Take 1 Tablet By Mouth Once A Day 8)  Calcium 600/vitamin D 600-400 Mg-Unit  Chew (Calcium Carbonate-Vitamin D) .... Take 1 Tablet By Mouth Three Times A Day 9)  Aspirin 81 Mg  Tbec (Aspirin) .... Take 1 Tablet By Mouth Once A Day 10)  Bupropion Hcl 150 Mg  Tb12 (Bupropion Hcl) .... Two Tabs By Mouth Qd 11)  Fluoxetine Hcl 20 Mg Caps (Fluoxetine Hcl) .... Two Caps By Mouth Qd 12)  Vicodin Hp 10-660 Mg Tabs (Hydrocodone-Acetaminophen) .... Take 1 Tablet By Mouth Two Times A Day As Needed 13)  Fish Oil 1000 Mg Caps (Omega-3 Fatty Acids) .... Take 1 Tablet By Mouth Three Times A Day 14)  Vitamin B-12 1000 Mcg Tabs (Cyanocobalamin) .... Take 1 Tablet By Mouth Three Times A Day 15)  Lisinopril 20 Mg Tabs (Lisinopril) .... Take 1 Tablet By Mouth Once A Day 16)  Leg Cramps With Quinine .... Two Tabs By Mouth At Bedtime  Allergies (verified): 1)  ! Penicillin  Review of Systems      See HPI General:  Complains of fatigue. Eyes:  Denies discharge, eye pain, and red eye. MS:  Complains of joint pain, low back pain, mid back pain, and stiffness. Neuro:  Complains of numbness; bilateral carpal tunnel syndrome. Psych:  Complains of anxiety and  depression; denies easily tearful, irritability, mental problems, suicidal thoughts/plans, thoughts of violence, and unusual visions or sounds. Endo:  Denies excessive thirst and excessive urination; tests once daily, fastings seldom over 120. Heme:  Denies abnormal bruising and bleeding. Allergy:  Complains of seasonal allergies.  Physical Exam  General:  alert, well-hydrated, and overweight-appearing.  HEENT: No facial asymmetry,  EOMI, No sinus tenderness, TM's Clear, oropharynx  pink and moist.t   Chest: Clear to auscultation bilaterally.  CVS: S1, S2, No murmurs, No S3.   Abd: Soft, Nontender.  MS: decreased ROM spine,right hip and right  knee.  Ext: No edema.   CNS: CN 2-12 intact, power tone and sensation normal throughout.   Skin: Intact, no  visible lesions or rashes.  Psych: Good eye contact, normal affect.  Memory intact, not anxious or depressed appearing.    Diabetes Management Exam:    Foot Exam (with socks and/or shoes not present):       Sensory-Monofilament:          Left foot: normal          Right foot: normal       Inspection:          Left foot: normal          Right foot: normal       Nails:          Left foot: normal          Right foot: normal   Impression & Recommendations:  Problem # 1:  TREMOR (ICD-781.0) Assessment Deteriorated followed by neurology  Problem # 2:  DEPRESSION (ICD-311) Assessment: Improved  Her updated medication list for this problem includes:    Trazodone Hcl 100 Mg Tabs (Trazodone hcl) .Marland Kitchen... Take three tablets by mouth at bedtime    Bupropion Hcl 150 Mg Tb12 (Bupropion hcl) .Marland Kitchen..Marland Kitchen Two tabs by mouth qd    Fluoxetine Hcl 20 Mg Caps (Fluoxetine hcl) .Marland Kitchen..Marland Kitchen Two caps by mouth qd  Problem # 3:  HYPERTENSION (ICD-401.9) Assessment: Improved  The following medications were removed from the medication list:    Lisinopril 20 Mg Tabs (Lisinopril) .Marland Kitchen... Take 1 tablet by mouth once a day Her updated medication list for this problem includes:    Clonidine Hcl 0.1 Mg Tabs (Clonidine hcl) .Marland Kitchen... Take 1 tablet by mouth once a day    Lasix 20 Mg Tabs (Furosemide) .Marland Kitchen... Take 1 tablet by mouth once a day    Lotensin 40 Mg Tabs (Benazepril hcl) .Marland Kitchen... 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 Take 1 tablet by mouth once a day Her  updated medication list for this problem includes:    Metformin Hcl 500 Mg Tabs (Metformin hcl) .Marland Kitchen... Take 1 tablet by mouth once a day    Aspirin 81 Mg Tbec (Aspirin) .Marland Kitchen... Take 1 tablet by mouth once a day    Lotensin 40 Mg Tabs (Benazepril hcl) .Marland Kitchen... Take 1 tablet by mouth once a day  Orders: T- Hemoglobin A1C ([id number])  Labs Reviewed: Creat: 0.80 (05/23/2010)  Reviewed HgBA1c results: 6.1 (05/28/2010)  6.0 (02/04/2010)  Problem # 5:  SPASM OF MUSCLE (ICD-728.85) Assessment: Deteriorated trial of cyclobenzaprine  Complete Medication List: 1)  Clonidine Hcl 0.1 Mg Tabs (Clonidine hcl) .... Take 1 tablet by mouth once a day 2)  Lasix 20 Mg Tabs (Furosemide) .... Take 1 tablet by mouth once a day 3)  Klor-con M20 20 Meq Tbcr (Potassium chloride crys cr) .... Take 1 tablet by mouth two times a day 4)  Trazodone Hcl 100 Mg Tabs (Trazodone hcl) .... Take three tablets by mouth at bedtime 5)  Simvastatin 80 Mg Tabs (Simvastatin) .... Take 1 tab by mouth at bedtime 6)  Metformin Hcl 500 Mg Tabs (Metformin hcl) .... Take 1 tablet by mouth once a day 7)  Multi-vitamin Tabs (Multiple vitamin) .... Take 1 tablet by mouth once a day 8)  Calcium 600/vitamin D 600-400 Mg-unit Chew (Calcium carbonate-vitamin d) .... Take 1 tablet by mouth three times a day 9)  Aspirin 81 Mg Tbec (Aspirin) .... Take 1 tablet by mouth once a day 10)  Bupropion Hcl 150 Mg Tb12 (Bupropion hcl) .... Two tabs by mouth qd 11)  Fluoxetine Hcl 20 Mg Caps (Fluoxetine hcl) .... Two caps by mouth qd 12)  Vicodin Hp 10-660 Mg Tabs (Hydrocodone-acetaminophen) .... Take 1 tablet by mouth two times a day as needed 13)  Fish Oil 1000 Mg Caps (Omega-3 fatty acids) .... Take 1 tablet by mouth three times a day 14)  Vitamin B-12 1000 Mcg Tabs (Cyanocobalamin) .... Take 1 tablet by mouth three times a day 15)  Lotensin 40 Mg Tabs (Benazepril hcl) .... Take 1 tablet by mouth once a day 16)  Cyclobenzaprine Hcl 10 Mg Tabs  (Cyclobenzaprine hcl) .... Take 1 tab by mouth at bedtime  Patient Instructions: 1)  F/U early October 2)  HbgA1C prior to visit, ICD-9:  chem 7, day of next visit. 3)  Your bp is still high , dose inc to lotensin 40mg  , pls take TWO 20mg  tabs till done, new tab is 40mg . 4)  One new med at bedrtime for muscle cramps. 5)  Pls stop the quinine Prescriptions: VICODIN HP 10-660 MG TABS (HYDROCODONE-ACETAMINOPHEN) Take 1 tablet by mouth two times a day as needed  #60 x 3   Entered by:   Adella Hare LPN   Authorized by:   Syliva Overman MD   Signed by:   Adella Hare LPN on 29/56/2130   Method used:   Printed then faxed to ...       [facility] (retail)       [address]       [place], [place]  [address]       Ph: [phone]       Fax: [phone]   RxID:   [id number] BUPROPION HCL 150 MG  TB12 (BUPROPION HCL) two tabs by mouth qd  #60 Each x 3   Entered by:   Adella Hare LPN   Authorized by:   Syliva Overman MD   Signed by:   Adella Hare LPN on 63/87/5643   Method used:   Electronically to        [facility] (retail)       [address]       [place], [place]  [address]       Ph: [phone]  Fax: [phone]   RxID:   [id number] FLUOXETINE HCL 20 MG CAPS (FLUOXETINE HCL) two caps by mouth qd  #60 Each x 3   Entered by:   Adella Hare LPN   Authorized by:   Syliva Overman MD   Signed by:   Adella Hare LPN on 01/60/1093   Method used:   Electronically to        [facility] (retail)       [address]       [place], [place]  [address]       Ph: [phone]       Fax: [phone]   RxID:   [id number] TRAZODONE HCL 100 MG  TABS (TRAZODONE HCL) Take three tablets by mouth at bedtime  #90 Each x 3   Entered by:   Adella Hare LPN   Authorized by:   Syliva Overman MD   Signed by:   Adella Hare LPN on 85/46/2703   Method used:   Electronically to        [facility] (retail)       [address]       [place], [place]  [address]       Ph: [phone]       Fax: [phone]   RxID:   [id number] KLOR-CON M20 20 MEQ  TBCR (POTASSIUM CHLORIDE CRYS CR) Take 1 tablet by mouth two times a day  #60 Each x 3   Entered by:   Adella Hare LPN   Authorized by:   Syliva Overman MD   Signed by:   Adella Hare LPN on 53/61/4431   Method used:   Electronically to        [facility] (retail)       [address]       [place], [place]  [address]       Ph: [phone]       Fax: [phone]   RxID:   [id number] LASIX 20 MG  TABS (FUROSEMIDE) Take 1 tablet by mouth once a day  #30 Each x 3   Entered by:   Adella Hare LPN   Authorized by:   Syliva Overman MD   Signed by:   Adella Hare LPN on 93/79/0240   Method used:   Electronically to        [facility] (retail)       [address]       [place], [place]  [address]       Ph: [phone]       Fax: [phone]   RxID:   [id number] CLONIDINE HCL 0.1 MG  TABS (CLONIDINE HCL) Take 1 tablet by mouth once a day  #30 Each x 3   Entered by:   Adella Hare LPN   Authorized by:   Syliva Overman MD   Signed by:   Adella Hare LPN on 85/63/1497   Method used:   Electronically to        [facility] (retail)       [address]       [place], [place]  [address]       Ph: [phone]  Fax: [phone]   RxID:   [id number] CARISOPRODOL 350 MG TABS (CARISOPRODOL) Take 1 tab by mouth at bedtime  #30 x 3   Entered and Authorized by:   Syliva Overman MD   Signed by:   Syliva Overman MD on 08/20/2010   Method used:   Electronically to        [facility] (retail)       [address]       [place], [place]  [address]       Ph: [phone]       Fax: [phone]   RxID:   [id number] LOTENSIN 40 MG TABS (BENAZEPRIL HCL) Take 1 tablet by mouth  once a day  #30 x 3   Entered and Authorized by:   Syliva Overman MD   Signed by:   Syliva Overman MD on 08/20/2010   Method used:   Printed then faxed to ...       [facility] (retail)       [address]       [place], [place]  [address]       Ph: [phone]       Fax: [phone]   RxID:   [id number]

## 2011-01-21 NOTE — Letter (Signed)
Summary: history and physical  history and physical   Imported By: Curtis Sites 06/12/2010 12:00:28  _____________________________________________________________________  External Attachment:    Type:   Image     Comment:   External Document

## 2011-01-21 NOTE — Letter (Signed)
Summary: consult  consult   Imported By: Curtis Sites 06/12/2010 11:59:55  _____________________________________________________________________  External Attachment:    Type:   Image     Comment:   External Document

## 2011-01-21 NOTE — Letter (Signed)
Summary: lab  lab   Imported By: Curtis Sites 06/12/2010 12:00:44  _____________________________________________________________________  External Attachment:    Type:   Image     Comment:   External Document

## 2011-01-21 NOTE — Letter (Signed)
Summary: progress notes  progress notes   Imported By: Curtis Sites 06/12/2010 12:01:43  _____________________________________________________________________  External Attachment:    Type:   Image     Comment:   External Document

## 2011-01-21 NOTE — Progress Notes (Signed)
Summary: speak with nurse  Phone Note Call from Patient   Summary of Call: needs to speak with nurse about dibetic supplies. [phone] Initial call taken by: Rudene Anda,  September 04, 2010 10:09 AM  Follow-up for Phone Call        advised patient form will be completed once recieved Follow-up by: Adella Hare LPN,  September 05, 2010 4:15 PM

## 2011-01-21 NOTE — Letter (Signed)
Summary: demographic  demographic   Imported By: Curtis Sites 06/12/2010 12:00:10  _____________________________________________________________________  External Attachment:    Type:   Image     Comment:   External Document

## 2011-01-21 NOTE — Letter (Signed)
Summary: handicap card  handicap card   Imported By: Lind Guest 02/05/2010 14:13:45  _____________________________________________________________________  External Attachment:    Type:   Image     Comment:   External Document

## 2011-01-21 NOTE — Assessment & Plan Note (Signed)
Summary: office visit   Vital Signs:  Patient profile:   60 year old female Menstrual status:  hysterectomy Height:      58.5 inches Weight:      228 pounds BMI:     47.01 O2 Sat:      99 % Pulse rate:   91 / minute Pulse rhythm:   regular Resp:     16 per minute BP sitting:   130 / 90  (right arm)  Vitals Entered By: Everitt Amber (February 04, 2010 8:07 AM)  Nutrition Counseling: Patient's BMI is greater than 25 and therefore counseled on weight management options. CC: Follow up chronic problems   CC:  Follow up chronic problems.  History of Present Illness: Reports  that they are doing well. Denies recent fever or chills. Denies sinus pressure, nasal congestion ,  or sore throat.She does c/o right ear pain with abnormal whooshing sound for months Denies chest congestion, or cough productive of sputum. Denies chest pain, palpitations, PND, orthopnea or leg swelling.  Denies change in bowel movements or bloody stool. Denies dysuria , frequency, incontinence or hesitancy.  Denies headaches, vertigo, seizures. Denies depression, anxiety or insomnia. Denies  rash, lesions, or itch.     Current Medications (verified): 1)  Clonidine Hcl 0.1 Mg  Tabs (Clonidine Hcl) .... Take 1 Tablet By Mouth Once A Day 2)  Lasix 20 Mg  Tabs (Furosemide) .... Take 1 Tablet By Mouth Once A Day 3)  Klor-Con M20 20 Meq  Tbcr (Potassium Chloride Crys Cr) .... Take 1 Tablet By Mouth Two Times A Day 4)  Trazodone Hcl 100 Mg  Tabs (Trazodone Hcl) .... Take Three Tablets By Mouth At Bedtime 5)  Simvastatin 80 Mg  Tabs (Simvastatin) .... Take 1 Tab By Mouth At Bedtime 6)  Metformin Hcl 500 Mg  Tabs (Metformin Hcl) .... Take 1 Tablet By Mouth Once A Day 7)  Multi-Vitamin   Tabs (Multiple Vitamin) .... Take 1 Tablet By Mouth Once A Day 8)  Calcium 600/vitamin D 600-400 Mg-Unit  Chew (Calcium Carbonate-Vitamin D) .... Take 1 Tablet By Mouth Three Times A Day 9)  Aspirin 81 Mg  Tbec (Aspirin) .... Take  1 Tablet By Mouth Once A Day 10)  Bupropion Hcl 150 Mg  Tb12 (Bupropion Hcl) .... Two Tabs By Mouth Qd 11)  Fluoxetine Hcl 20 Mg Caps (Fluoxetine Hcl) .... Two Caps By Mouth Qd 12)  Vicodin Hp 10-660 Mg Tabs (Hydrocodone-Acetaminophen) .... Take 1 Tablet By Mouth Two Times A Day As Needed 13)  Fish Oil 1000 Mg Caps (Omega-3 Fatty Acids) .... Take 1 Tablet By Mouth Three Times A Day 14)  Lisinopril 10 Mg Tabs (Lisinopril) .... One Tab By Mouth Qd  Allergies (verified): 1)  ! Penicillin  Review of Systems      See HPI ENT:  Complains of earache; whooshing sound in the right esar, getting worse duration approx 3 months, also intolerant of right hearing aid, the canal gets wet. GI:  Complains of abdominal pain, bloody stools, and diarrhea; denies loss of appetite, nausea, and vomiting. MS:  Complains of joint pain, low back pain, mid back pain, and stiffness; right knee pain with instability, bilateral hip pain, back pain. Neuro:  Complains of tremors; upper ext and upper body tremor progressing over the past yr, saw neuro and had to come off the  topamax she had been put on. Endo:  Denies cold intolerance, excessive hunger, excessive thirst, excessive urination, heat intolerance, polyuria, and weight  change; in december blood sugars were elevated, had been placed on topamax which she did not tolerate for her tremors . Heme:  Denies abnormal bruising and bleeding. Allergy:  Denies hives or rash and itching eyes.  Physical Exam  General:  alert, well-hydrated, and overweight-appearing.  HEENT: No facial asymmetry,  EOMI, No sinus tenderness, TM's Clear, oropharynx  pink and moist.t   Chest: Clear to auscultation bilaterally.  CVS: S1, S2, No murmurs, No S3.   Abd: Soft, Nontender.  MS: decreased ROM spine, hips, shoulders and knees.  Ext: No edema.   CNS: CN 2-12 intact, power tone and sensation normal throughout.   Skin: Intact, no visible lesions or rashes.  Psych: Good eye contact,  normal affect.  Memory intact, not anxious or depressed appearing.     Impression & Recommendations:  Problem # 1:  HEARING LOSS (ICD-389.9) Assessment Unchanged reportedly intolerant of the hearing aid, will have eNT look at it  Problem # 2:  TREMOR (ICD-781.0) Assessment: Unchanged reportedly intolerNT OF TOPAMAX WHICH NEURO HAD PRESCRIBED, CURRENTLY ON NO MEDS FOR THIS  Problem # 3:  OBESITY (ICD-278.00) Assessment: Deteriorated  Ht: 58.5 (02/04/2010)   Wt: 228 (02/04/2010)   BMI: 47.01 (02/04/2010)  Problem # 4:  DIABETES MELLITUS, TYPE II, WITHOUT COMPLICATIONS (ICD-250.00) Assessment: Unchanged  Her updated medication list for this problem includes:    Metformin Hcl 500 Mg Tabs (Metformin hcl) .Marland Kitchen... Take 1 tablet by mouth once a day    Aspirin 81 Mg Tbec (Aspirin) .Marland Kitchen... Take 1 tablet by mouth once a day    Lisinopril 10 Mg Tabs (Lisinopril) ..... One tab by mouth qd  Orders: T-Urine Microalbumin w/creat. ratio [phone]) Glucose, (CBG) (82962) Hemoglobin A1C (83036)  Labs Reviewed: Creat: 0.87 (10/04/2009)    Reviewed HgBA1c results: 6.0 (02/04/2010)  5.8 (10/04/2009)  Problem # 5:  HYPERTENSION (ICD-401.9) Assessment: Deteriorated  Her updated medication list for this problem includes:    Clonidine Hcl 0.1 Mg Tabs (Clonidine hcl) .Marland Kitchen... Take 1 tablet by mouth once a day    Lasix 20 Mg Tabs (Furosemide) .Marland Kitchen... Take 1 tablet by mouth once a day    Lisinopril 10 Mg Tabs (Lisinopril) ..... One tab by mouth qd  Orders: T-Basic Metabolic Panel [phone])  BP today: 130/90 Prior BP: 130/80 (10/04/2009)  Labs Reviewed: K+: 4.5 (10/04/2009) Creat: : 0.87 (10/04/2009)   Chol: 148 (10/04/2009)   HDL: 69 (10/04/2009)   LDL: 44 (10/04/2009)   TG: 173 (10/04/2009)  Complete Medication List: 1)  Clonidine Hcl 0.1 Mg Tabs (Clonidine hcl) .... Take 1 tablet by mouth once a day 2)  Lasix 20 Mg Tabs (Furosemide) .... Take 1 tablet by mouth once a day 3)   Klor-con M20 20 Meq Tbcr (Potassium chloride crys cr) .... Take 1 tablet by mouth two times a day 4)  Trazodone Hcl 100 Mg Tabs (Trazodone hcl) .... Take three tablets by mouth at bedtime 5)  Simvastatin 80 Mg Tabs (Simvastatin) .... Take 1 tab by mouth at bedtime 6)  Metformin Hcl 500 Mg Tabs (Metformin hcl) .... Take 1 tablet by mouth once a day 7)  Multi-vitamin Tabs (Multiple vitamin) .... Take 1 tablet by mouth once a day 8)  Calcium 600/vitamin D 600-400 Mg-unit Chew (Calcium carbonate-vitamin d) .... Take 1 tablet by mouth three times a day 9)  Aspirin 81 Mg Tbec (Aspirin) .... Take 1 tablet by mouth once a day 10)  Bupropion Hcl 150 Mg Tb12 (Bupropion hcl) .... Two tabs by mouth  qd 11)  Fluoxetine Hcl 20 Mg Caps (Fluoxetine hcl) .... Two caps by mouth qd 12)  Vicodin Hp 10-660 Mg Tabs (Hydrocodone-acetaminophen) .... Take 1 tablet by mouth two times a day as needed 13)  Fish Oil 1000 Mg Caps (Omega-3 fatty acids) .... Take 1 tablet by mouth three times a day 14)  Lisinopril 10 Mg Tabs (Lisinopril) .... One tab by mouth qd  Other Orders: T-Hepatic Function [phone]) T-Lipid Profile [phone]) T-CBC w/Diff [phone]) Ketorolac-Toradol 15mg  [phone]) Admin of Therapeutic Inj  intramuscular or subcutaneous (96295)  Patient Instructions: 1)  Please schedule a follow-up appointment  2)  end june or early July. 3)  It is important that you exercise regularly at least 20 minutes 5 times a week. If you develop chest pain, have severe difficulty breathing, or feel very tired , stop exercising immediately and seek medical attention. 4)  You need to lose weight. Consider a lower calorie diet and regular exercise.  5)  Check your blood sugars regularly. If your readings are usually above : or below 70 you should contact our office. 6)  pls call the  eNT doc for an earlier appt about your ear symptoms 7)  BMP prior to visit, ICD-9: 8)  Hepatic Panel prior to visit, ICD-9: 9)  Lipid  Panel prior to visit, ICD-9:   fasying in 4 months 10)  CBC w/ Diff prior to visit, ICD-9: 11)  Urine Microalbumin prior to visit, ICD-9: Prescriptions: VICODIN HP 10-660 MG TABS (HYDROCODONE-ACETAMINOPHEN) Take 1 tablet by mouth two times a day as needed  #60 x 2   Entered by:   Everitt Amber   Authorized by:   Syliva Overman MD   Signed by:   Everitt Amber on 02/04/2010   Method used:   Printed then faxed to ...       [facility] (retail)       [address]       [place], [place]  [address]       Ph: [phone]       Fax: [phone]   RxID:   [id number] LISINOPRIL 10 MG TABS (LISINOPRIL) one tab by mouth qd  #30 x 4   Entered by:   Everitt Amber   Authorized by:   Syliva Overman MD   Signed by:   Everitt Amber on 02/04/2010   Method used:   Electronically to        [facility] (retail)       [address]       [place], [place]  [address]       Ph: [phone]       Fax: [phone]   RxID:   [id number] KLOR-CON M20 20 MEQ  TBCR (POTASSIUM CHLORIDE CRYS CR) Take 1 tablet by mouth two times a day  #60 Each x 4   Entered by:   Everitt Amber   Authorized by:   Syliva Overman MD   Signed by:   Everitt Amber on 02/04/2010   Method used:   Electronically to        [facility] (retail)       [address]       [place], [place]  [address]       Ph: [phone]       Fax: [phone]  RxID:   [id number] CLONIDINE HCL 0.1 MG  TABS (CLONIDINE HCL) Take 1 tablet by mouth once a day  #30 Each x 4   Entered by:   Everitt Amber   Authorized by:   Syliva Overman MD   Signed by:   Everitt Amber on 02/04/2010   Method used:   Electronically to        [facility] (retail)       [address]       [place], [place]  [address]       Ph: [phone]       Fax: [phone]   RxID:   [id number]   Laboratory Results   Blood Tests   Date/Time Received:  February 04, 2010  Date/Time Reported: February 04, 2010   Glucose (fasting): 109 mg/dL   (Normal Range: 88-416) HGBA1C: 6.0%   (Normal Range: Non-Diabetic - 3-6%   Control Diabetic - 6-8%)       Medication Administration  Injection # 1:    Medication: Ketorolac-Toradol 15mg     Diagnosis: KNEE PAIN (ICD-719.46)    Route: IM    Site: RUOQ gluteus    Exp Date: 07/2011    Lot #: [id number]    Mfr: novaplus    Comments: 60 mg given     Patient tolerated injection without complications    Given by: Everitt Amber (February 04, 2010 9:08 AM)  Orders Added: 1)  Est. Patient Level IV [99214] 2)  T-Basic Metabolic Panel [80048-22910] 3)  T-Hepatic Function [80076-22960] 4)  T-Lipid Profile [80061-22930] 5)  T-CBC w/Diff [60630-16010] 6)  T-Urine Microalbumin w/creat. ratio [82043-[address]] 7)  Glucose, (CBG) [82962] 8)  Hemoglobin A1C [83036] 9)  Ketorolac-Toradol 15mg  [J1885] 10)  Admin of Therapeutic Inj  intramuscular or subcutaneous [93235]

## 2011-01-21 NOTE — Consult Note (Signed)
Summary: [facility]   Imported By: Lind Guest 09/06/2010 14:14:56  _____________________________________________________________________  External Attachment:    Type:   Image     Comment:   External Document

## 2011-01-21 NOTE — Progress Notes (Signed)
  Phone Note Call from Patient   Summary of Call: Patient's lisinopril 20mg  was supposed to be increased to 40mg  but instead lotensin 40 was sent in. Patient didn't recognize the drug so she didn't get it. Dr. Lodema Hong wants it changed back to Lisinopril 40mg . WIll fax to pharmacy and make them aware Initial call taken by: Everitt Amber LPN,  August 21, 2010 8:34 AM    New/Updated Medications: LISINOPRIL 40 MG TABS (LISINOPRIL) Take 1 tablet by mouth once a day Prescriptions: LISINOPRIL 40 MG TABS (LISINOPRIL) Take 1 tablet by mouth once a day  #30 x 3   Entered by:   Everitt Amber LPN   Authorized by:   Syliva Overman MD   Signed by:   Everitt Amber LPN on 16/09/9603   Method used:   Printed then faxed to ...       [facility] (retail)       [address]       [place], [place]  [address]       Ph: [phone]       Fax: [phone]   RxID:   [id number]

## 2011-01-21 NOTE — Letter (Signed)
Summary: misc  misc   Imported By: Curtis Sites 06/12/2010 12:01:03  _____________________________________________________________________  External Attachment:    Type:   Image     Comment:   External Document

## 2011-01-21 NOTE — Progress Notes (Signed)
  Phone Note Call from Patient   Summary of Call: [facility] in [place] called and said that insurance was denying the [facility] for this patient because they were saying that it wasn't indicated for elderly patients. She wanted you to know this incase you wanted to change it. If you don't she can run it without insurance and its only 13 dollars.  Initial call taken by: Everitt Amber LPN,  August 20, 2010 10:15 AM  Follow-up for Phone Call        let pt know ichanged to cylcobenz which i hoe is ciovered, but is cheaper, pls fax and write the change Follow-up by: Syliva Overman MD,  August 20, 2010 12:34 PM  Additional Follow-up for Phone Call Additional follow up Details #1::        patient aware Additional Follow-up by: Adella Hare LPN,  August 20, 2010 2:21 PM    New/Updated Medications: CYCLOBENZAPRINE HCL 10 MG TABS (CYCLOBENZAPRINE HCL) Take 1 tab by mouth at bedtime Prescriptions: CYCLOBENZAPRINE HCL 10 MG TABS (CYCLOBENZAPRINE HCL) Take 1 tab by mouth at bedtime  #30 x 4   Entered and Authorized by:   Syliva Overman MD   Signed by:   Syliva Overman MD on 08/20/2010   Method used:   Printed then faxed to ...       [facility] (retail)       [address]       [place], [place]  [address]       Ph: [phone]       Fax: [phone]   RxID:   [id number]

## 2011-01-21 NOTE — Assessment & Plan Note (Signed)
Summary: OFFICE VISIT   Vital Signs:  Patient profile:   60 year old female Menstrual status:  hysterectomy Height:      58.5 inches Weight:      213.75 pounds BMI:     44.07 O2 Sat:      92 % on Room air Pulse rate:   91 / minute Pulse rhythm:   regular Resp:     16 per minute BP sitting:   148 / 102  (left arm)  Vitals Entered By: Mauricia Area CMA (October 14, 2010 9:12 AM)  Nutrition Counseling: Patient's BMI is greater than 25 and therefore counseled on weight management options.  O2 Flow:  Room air 148CC: follow up   CC:  follow up.  History of Present Illness: Reports  that she is doing well. Denies recent fever or chills. Denies sinus pressure, nasal congestion , ear pain or sore throat. Denies chest congestion, or cough productive of sputum. Denies chest pain, palpitations, PND, orthopnea or leg swelling. Denies abdominal pain, nausea, vomitting, diarrhea or constipation.  Denies change in bowel movements or bloody stool. Denies dysuria , frequency, incontinence or hesitancy. Denies  joint pain, swelling, or reduced mobility. Denies headaches, vertigo, seizures. Denies depression, anxiety or insomnia. Denies  rash, lesions, or itch.     Current Medications (verified): 1)  Clonidine Hcl 0.1 Mg  Tabs (Clonidine Hcl) .... Take 1 Tablet By Mouth Once A Day 2)  Lasix 20 Mg  Tabs (Furosemide) .... Take 1 Tablet By Mouth Once A Day 3)  Klor-Con M20 20 Meq  Tbcr (Potassium Chloride Crys Cr) .... Take 1 Tablet By Mouth Two Times A Day 4)  Trazodone Hcl 100 Mg  Tabs (Trazodone Hcl) .... Take Three Tablets By Mouth At Bedtime 5)  Multi-Vitamin   Tabs (Multiple Vitamin) .... Take 1 Tablet By Mouth Once A Day 6)  Calcium 600/vitamin D 600-400 Mg-Unit  Chew (Calcium Carbonate-Vitamin D) .... Take 1 Tablet By Mouth Three Times A Day 7)  Aspirin 81 Mg  Tbec (Aspirin) .... Take 1 Tablet By Mouth Once A Day 8)  Bupropion Hcl 150 Mg  Tb12 (Bupropion Hcl) .... Two Tabs By  Mouth Once Daily. 9)  Fluoxetine Hcl 20 Mg Caps (Fluoxetine Hcl) .... Two Caps By Mouth Once Daily. 10)  Vicodin Hp 10-660 Mg Tabs (Hydrocodone-Acetaminophen) .... Take 1 Tablet By Mouth Two Times A Day As Needed 11)  Lisinopril 40 Mg Tabs (Lisinopril) .... Take 1 Tablet By Mouth Once A Day 12)  Cyclobenzaprine Hcl 10 Mg Tabs (Cyclobenzaprine Hcl) .... Take 1 Tab By Mouth At Bedtime 13)  Simvastatin 40 Mg Tabs (Simvastatin) .... Take 1 Tab By Mouth At Bedtime  Allergies (verified): 1)  ! Penicillin  Review of Systems      See HPI General:  Complains of fatigue. Eyes:  Denies blurring and eye pain. GI:  Complains of diarrhea and nausea; iBS diarreah predominanat. MS:  Complains of joint pain and stiffness; right hip and knee. Endo:  Denies excessive thirst and excessive urination. Heme:  Denies abnormal bruising and bleeding. Allergy:  Denies hives or rash and itching eyes.  Physical Exam  General:  Well-developed,well-nourished,in no acute distress; alert,appropriate and cooperative throughout examination HEENT: No facial asymmetry,  EOMI, No sinus tenderness, TM's Clear, oropharynx  pink and moist.   Chest: Clear to auscultation bilaterally.  CVS: S1, S2, No murmurs, No S3.   Abd: Soft, Nontender.  MS: decreased ROM spine, hips, shoulders and knees.  Ext: No edema.  CNS: CN 2-12 intact, power tone and sensation normal throughout.   Skin: Intact, no visible lesions or rashes.  Psych: Good eye contact, normal affect.  Memory intact, not anxious or depressed appearing.    Impression & Recommendations:  Problem # 1:  NAUSEA (ICD-787.02) Assessment Deteriorated  Orders: Medicare Electronic Prescription [phone])  Problem # 2:  DEPRESSION (ICD-311) Assessment: Unchanged  Her updated medication list for this problem includes:    Trazodone Hcl 100 Mg Tabs (Trazodone hcl) .Marland Kitchen... Take three tablets by mouth at bedtime    Bupropion Hcl 150 Mg Tb12 (Bupropion hcl) .Marland Kitchen..Marland Kitchen Two tabs  by mouth once daily.    Fluoxetine Hcl 20 Mg Caps (Fluoxetine hcl) .Marland Kitchen..Marland Kitchen Two caps by mouth once daily.  Problem # 3:  DIABETES MELLITUS, TYPE II, WITHOUT COMPLICATIONS (ICD-250.00) Assessment: Improved  Her updated medication list for this problem includes:    Aspirin 81 Mg Tbec (Aspirin) .Marland Kitchen... Take 1 tablet by mouth once a day    Lisinopril 40 Mg Tabs (Lisinopril) .Marland Kitchen... Take 1 tablet by mouth once a day  Orders: T- Hemoglobin A1C ([id number])  Labs Reviewed: Creat: 0.80 (09/30/2010)    Reviewed HgBA1c results: 5.8 (09/30/2010)  6.1 (05/28/2010)  Problem # 4:  HYPERTENSION (ICD-401.9) Assessment: Deteriorated  The following medications were removed from the medication list:    Clonidine Hcl 0.1 Mg Tabs (Clonidine hcl) .Marland Kitchen... Take 1 tablet by mouth once a day Her updated medication list for this problem includes:    Lasix 20 Mg Tabs (Furosemide) .Marland Kitchen... Take 1 tablet by mouth once a day    Lisinopril 40 Mg Tabs (Lisinopril) .Marland Kitchen... Take 1 tablet by mouth once a day    Clonidine Hcl 0.2 Mg Tabs (Clonidine hcl) .Marland Kitchen... Take 1 tab by mouth at bedtime  Orders: Medicare Electronic Prescription [phone]) T-Basic Metabolic Panel [phone])  BP today: 148/102 Prior BP: 140/90 (08/20/2010)  Labs Reviewed: K+: 4.4 (09/30/2010) Creat: : 0.80 (09/30/2010)   Chol: 152 (05/23/2010)   HDL: 65 (05/23/2010)   LDL: 55 (05/23/2010)   TG: 160 (05/23/2010)  Complete Medication List: 1)  Lasix 20 Mg Tabs (Furosemide) .... Take 1 tablet by mouth once a day 2)  Klor-con M20 20 Meq Tbcr (Potassium chloride crys cr) .... Take 1 tablet by mouth two times a day 3)  Trazodone Hcl 100 Mg Tabs (Trazodone hcl) .... Take three tablets by mouth at bedtime 4)  Multi-vitamin Tabs (Multiple vitamin) .... Take 1 tablet by mouth once a day 5)  Calcium 600/vitamin D 600-400 Mg-unit Chew (Calcium carbonate-vitamin d) .... Take 1 tablet by mouth three times a day 6)  Aspirin 81 Mg Tbec (Aspirin) .... Take 1 tablet by  mouth once a day 7)  Bupropion Hcl 150 Mg Tb12 (Bupropion hcl) .... Two tabs by mouth once daily. 8)  Fluoxetine Hcl 20 Mg Caps (Fluoxetine hcl) .... Two caps by mouth once daily. 9)  Vicodin Hp 10-660 Mg Tabs (Hydrocodone-acetaminophen) .... Take 1 tablet by mouth two times a day as needed 10)  Lisinopril 40 Mg Tabs (Lisinopril) .... Take 1 tablet by mouth once a day 11)  Cyclobenzaprine Hcl 10 Mg Tabs (Cyclobenzaprine hcl) .... Take 1 tab by mouth at bedtime 12)  Simvastatin 40 Mg Tabs (Simvastatin) .... Take 1 tab by mouth at bedtime 13)  Clonidine Hcl 0.2 Mg Tabs (Clonidine hcl) .... Take 1 tab by mouth at bedtime 14)  Promethazine Hcl 12.5 Mg Tabs (Promethazine hcl) .... Take 1 tablet by mouth two times a day as  needed for uncontrolled nausea  Other Orders: T-Lipid Profile [phone]) T-Hepatic Function [id number]) T-TSH [phone]) T-Vitamin D (25-Hydroxy) [phone])  Patient Instructions: 1)  F/u in mid to end January. 2)  TSH   today 3)  Hepatic Panel prior to visit, ICD-9: 4)  Lipid Panel prior to visit, ICD-9: and Vit D 5)  It is important that you exercise regularly at least 20 minutes 5 times a week. If you develop chest pain, have severe difficulty breathing, or feel very tired , stop exercising immediately and seek medical attention. 6)  You need to lose weight. Consider a lower calorie diet and regular exercise.  7)  Your BP is too high. 8)  DOSE INCREASE on clonidine to 0.2mg  ONE daily. 9)  You may take TWO 0.1mg  clonidine tablets every day at the same time until you finish those that you have 10)  New med for nausea , use only if needed 11)  BMP prior to visit, ICD-9:  non fasting 12)  HbgA1C prior to visit, ICD-9: Prescriptions: PROMETHAZINE HCL 12.5 MG TABS (PROMETHAZINE HCL) Take 1 tablet by mouth two times a day as needed for uncontrolled nausea  #60 x 3   Entered and Authorized by:   Syliva Overman MD   Signed by:   Syliva Overman MD on 10/14/2010    Method used:   Electronically to        [facility] ([facility])       [address]       [place], [place]  [address]       Ph: [phone]       Fax: [phone]   RxID:   [id number] CLONIDINE HCL 0.2 MG TABS (CLONIDINE HCL) Take 1 tab by mouth at bedtime  #30 x 3   Entered and Authorized by:   Syliva Overman MD   Signed by:   Syliva Overman MD on 10/14/2010   Method used:   Printed then faxed to ...       [facility] ([facility]ail)       [address]       [place], [place]  [address]       Ph: [phone]       Fax: [phone]   RxID:   [id number]    Orders Added: 1)  Est. Patient Level IV [50093] 2)  Medicare Electronic Prescription [G8553] 3)  T-Lipid Profile [80061-22930] 4)  T-Hepatic Function [80076-22960] 5)  T-TSH [81829-93716] 6)  T-Vitamin D (25-Hydroxy) [phone] 7)  T-Basic Metabolic Panel [80048-22910] 8)  T- Hemoglobin A1C [83036-23375]

## 2011-01-21 NOTE — Letter (Signed)
Summary: DIABETIC TESTING SUPPLIES  DIABETIC TESTING SUPPLIES   Imported By: Lind Guest 09/10/2010 11:29:01  _____________________________________________________________________  External Attachment:    Type:   Image     Comment:   External Document

## 2011-01-21 NOTE — Progress Notes (Signed)
Summary: ?  Phone Note Call from Patient   Summary of Call: WANTS YOU TO CALL HER SHE HAS A ? ABOUT HER HANDS AND BEING DIABETIC [phone] Initial call taken by: Lind Guest,  Apr 30, 2010 1:16 PM  Follow-up for Phone Call        2 months ago at 4:30am her thumb on right hand went completely numb and been numb ever since. The ortho said it could be the carpal tunnel returning but she has had surgery for that. She has appointment in June with dr. Lodema Hong but she's scared its related to the diabetes. Wants to know if it can wait until her next OV in June or if she needs to be seen earlier? Follow-up by: Everitt Amber LPN,  Apr 30, 2010 2:11 PM  Additional Follow-up for Phone Call Additional follow up Details #1::        Diabetic nerve problems don't come on suddenly like that, and usually affect the feet first before the hands.  If it's really bothering her & she doesn't want to wait til June we could see her sooner.  Otherwise it can wait til then. Additional Follow-up by: Esperanza Sheets PA,  Apr 30, 2010 3:09 PM    Additional Follow-up for Phone Call Additional follow up Details #2::    Patient aware Follow-up by: Everitt Amber LPN,  Apr 30, 2010 3:13 PM

## 2011-01-21 NOTE — Assessment & Plan Note (Signed)
Summary: office visit   Vital Signs:  Patient profile:   60 year old female Menstrual status:  hysterectomy Height:      58.5 inches Weight:      216.75 pounds BMI:     44.69 O2 Sat:      92 % Pulse rate:   84 / minute Pulse rhythm:   regular Resp:     16 per minute BP sitting:   150 / 94 Cuff size:   large  Vitals Entered By: Everitt Amber LPN (June 18, 2010 8:01 AM)  Nutrition Counseling: Patient's BMI is greater than 25 and therefore counseled on weight management options. CC: Follow up chronic problems   CC:  Follow up chronic problems.  History of Present Illness: Reports  that she has been doing fairly well.  Denies recent fever or chills. Denies sinus pressure, nasal congestion , ear pain or sore throat. Denies chest congestion, or cough productive of sputum. Denies chest pain, palpitations, PND, orthopnea or leg swelling. chronic abdominal pain and diarreah due to IBS  Denies dysuria , frequency, incontinence or hesitancy.  Denies headaches, vertigo, seizures. c/o depression and  anxiety, controlled on meds Denies  rash, lesions, or itch.     Current Medications (verified): 1)  Clonidine Hcl 0.1 Mg  Tabs (Clonidine Hcl) .... Take 1 Tablet By Mouth Once A Day 2)  Lasix 20 Mg  Tabs (Furosemide) .... Take 1 Tablet By Mouth Once A Day 3)  Klor-Con M20 20 Meq  Tbcr (Potassium Chloride Crys Cr) .... Take 1 Tablet By Mouth Two Times A Day 4)  Trazodone Hcl 100 Mg  Tabs (Trazodone Hcl) .... Take Three Tablets By Mouth At Bedtime 5)  Simvastatin 80 Mg  Tabs (Simvastatin) .... Take 1 Tab By Mouth At Bedtime 6)  Metformin Hcl 500 Mg  Tabs (Metformin Hcl) .... Take 1 Tablet By Mouth Once A Day 7)  Multi-Vitamin   Tabs (Multiple Vitamin) .... Take 1 Tablet By Mouth Once A Day 8)  Calcium 600/vitamin D 600-400 Mg-Unit  Chew (Calcium Carbonate-Vitamin D) .... Take 1 Tablet By Mouth Three Times A Day 9)  Aspirin 81 Mg  Tbec (Aspirin) .... Take 1 Tablet By Mouth Once A Day 10)   Bupropion Hcl 150 Mg  Tb12 (Bupropion Hcl) .... Two Tabs By Mouth Qd 11)  Fluoxetine Hcl 20 Mg Caps (Fluoxetine Hcl) .... Two Caps By Mouth Qd 12)  Vicodin Hp 10-660 Mg Tabs (Hydrocodone-Acetaminophen) .... Take 1 Tablet By Mouth Two Times A Day As Needed 13)  Fish Oil 1000 Mg Caps (Omega-3 Fatty Acids) .... Take 1 Tablet By Mouth Three Times A Day 14)  Lisinopril 10 Mg Tabs (Lisinopril) .... One Tab By Mouth Qd 15)  Vitamin B-12 1000 Mcg Tabs (Cyanocobalamin) .... Take 1 Tablet By Mouth Three Times A Day  Allergies (verified): 1)  ! Penicillin  Review of Systems      See HPI General:  Complains of fatigue, sleep disorder, and sweats; drenched in sweat all the time from ?? menopausal symptoms affecting every aspect of life. Eyes:  Denies discharge and red eye. GI:  Complains of abdominal pain; central abdomminal pain from chronic diarreah. MS:  Complains of joint pain and stiffness; right hip, right knee, right hand pain uncontrolled, and mid back, . Neuro:  Complains of numbness; right thumb numb for 3.5 monthsds every day and all day, ortho has injected, taken x rays and alluded to carpal tunnel , she has upcoming appt withneurology re tremor  miod July  . Psych:  Complains of anxiety and depression; denies suicidal thoughts/plans, thoughts of violence, and unusual visions or sounds. Endo:  Denies excessive thirst and excessive urination. Heme:  Denies abnormal bruising and bleeding. Allergy:  Denies hives or rash and itching eyes.  Physical Exam  General:  alert, well-hydrated, and overweight-appearing.  HEENT: No facial asymmetry,  EOMI, No sinus tenderness, TM's Clear, oropharynx  pink and moist.t   Chest: Clear to auscultation bilaterally.  CVS: S1, S2, No murmurs, No S3.   Abd: Soft, Nontender.  MS: decreased ROM spine,right hip and right  knee.  Ext: No edema.   CNS: CN 2-12 intact, power tone and sensation normal throughout.   Skin: Intact, no visible lesions or rashes.    Psych: Good eye contact, normal affect.  Memory intact, not anxious or depressed appearing.     Impression & Recommendations:  Problem # 1:  NUMBNESS (ICD-782.0) Assessment New  Orders: Neurology Referral (Neuro)  Problem # 2:  OBESITY (ICD-278.00) Assessment: Improved  Ht: 58.5 (06/18/2010)   Wt: 216.75 (06/18/2010)   BMI: 44.69 (06/18/2010)  Problem # 3:  HYPERTENSION (ICD-401.9) Assessment: Deteriorated  The following medications were removed from the medication list:    Lisinopril 10 Mg Tabs (Lisinopril) ..... One tab by mouth qd Her updated medication list for this problem includes:    Clonidine Hcl 0.1 Mg Tabs (Clonidine hcl) .Marland Kitchen... Take 1 tablet by mouth once a day    Lasix 20 Mg Tabs (Furosemide) .Marland Kitchen... Take 1 tablet by mouth once a day    Lisinopril 20 Mg Tabs (Lisinopril) .Marland Kitchen... 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 One tab by mouth qd Her updated medication list for this problem includes:    Metformin Hcl 500 Mg Tabs (Metformin hcl) .Marland Kitchen... Take 1 tablet by mouth once a day    Aspirin 81 Mg Tbec (Aspirin) .Marland Kitchen... Take 1 tablet by mouth once a day    Lisinopril 20 Mg Tabs (Lisinopril) .Marland Kitchen... Take 1 tablet by mouth once a day  Labs Reviewed: Creat: 0.80 (05/23/2010)    Reviewed HgBA1c results: 6.1 (05/28/2010)  6.0 (02/04/2010)  Problem # 5:  HYPERLIPIDEMIA (ICD-272.4) Assessment: Comment Only  Labs Reviewed: SGOT: 22 (05/23/2010)   SGPT: 21 (05/23/2010)   HDL:65 (05/23/2010), 69 (10/04/2009)  LDL:55 (05/23/2010), 44 (10/04/2009)  Chol:152 (05/23/2010),  148 (10/04/2009)  Trig:160 (05/23/2010), 173 (10/04/2009)  Problem # 6:  DEPRESSION (ICD-311) Assessment: Improved  Her updated medication list for this problem includes:    Trazodone Hcl 100 Mg Tabs (Trazodone hcl) .Marland Kitchen... Take three tablets by mouth at bedtime    Bupropion Hcl 150 Mg Tb12 (Bupropion hcl) .Marland Kitchen..Marland Kitchen Two tabs by mouth qd    Fluoxetine Hcl 20 Mg Caps (Fluoxetine hcl) .Marland Kitchen..Marland Kitchen Two caps by mouth qd  Problem # 7:  KNEE PAIN (MWN-027.25) Assessment: Deteriorated  Her updated medication list for this problem includes:    Aspirin 81 Mg Tbec (Aspirin) .Marland Kitchen... Take 1 tablet by mouth once a day    Vicodin Hp 10-660 Mg Tabs (Hydrocodone-acetaminophen) .Marland Kitchen... Take 1 tablet by mouth two  times a day as needed  Complete Medication List: 1)  Clonidine Hcl 0.1 Mg Tabs (Clonidine hcl) .... Take 1 tablet by mouth once a day 2)  Lasix 20 Mg Tabs (Furosemide) .... Take 1 tablet by mouth once a day 3)  Klor-con M20 20 Meq Tbcr (Potassium chloride crys cr) .... Take 1 tablet by mouth two times a day 4)  Trazodone Hcl 100 Mg Tabs (Trazodone hcl) .... Take three tablets by mouth at bedtime 5)  Simvastatin 80 Mg Tabs (Simvastatin) .... Take 1 tab by mouth at bedtime 6)  Metformin Hcl 500 Mg Tabs (Metformin hcl) .... Take 1 tablet by mouth once a day 7)  Multi-vitamin Tabs (Multiple vitamin) .... Take 1 tablet by mouth once a day 8)  Calcium 600/vitamin D 600-400 Mg-unit Chew (Calcium carbonate-vitamin d) .... Take 1 tablet by mouth three times a day 9)  Aspirin 81 Mg Tbec (Aspirin) .... Take 1 tablet by mouth once a day 10)  Bupropion Hcl 150 Mg Tb12 (Bupropion hcl) .... Two tabs by mouth qd 11)  Fluoxetine Hcl 20 Mg Caps (Fluoxetine hcl) .... Two caps by mouth qd 12)  Vicodin Hp 10-660 Mg Tabs (Hydrocodone-acetaminophen) .... Take 1 tablet by mouth two times a day as needed 13)  Fish Oil 1000 Mg Caps (Omega-3 fatty acids) .... Take 1 tablet by mouth three times a day 14)  Vitamin B-12 1000 Mcg Tabs  (Cyanocobalamin) .... Take 1 tablet by mouth three times a day 15)  Gabapentin 100 Mg Caps (Gabapentin) .... 3 capsules at bedtime, increase every 3 days as tolerated, starting at 1 capsule 16)  Lisinopril 20 Mg Tabs (Lisinopril) .... Take 1 tablet by mouth once a day  Other Orders: Radiology Referral (Radiology)  Patient Instructions: 1)  Please schedule a follow-up appointment in 2 months. 2)  It is important that you exercise regularly at least 20 minutes 5 times a week. If you develop chest pain, have severe difficulty breathing, or feel very tired , stop exercising immediately and seek medical attention. 3)  You need to lose weight. Consider a lower calorie diet and regular exercise. CONGRATS on your 12 pound weight loss. 4)  Your BP is too high , new med dose. 5)  New med for pain Prescriptions: LISINOPRIL 20 MG TABS (LISINOPRIL) Take 1 tablet by mouth once a day  #30 x 1   Entered and Authorized by:   Syliva Overman MD   Signed by:   Syliva Overman MD on 06/18/2010   Method used:   Printed then faxed to ...       [facility] (retail)       [address]       [place], [place]  [address]       Ph: [phone]       Fax: [phone]   RxID:   [id number] GABAPENTIN 100 MG CAPS (GABAPENTIN) 3 capsules at bedtime, increase every 3 days as tolerated, starting at 1 capsule  #90 x 3   Entered and Authorized by:   Syliva Overman MD   Signed by:   Syliva Overman MD on 06/18/2010   Method used:   Electronically to        [facility] (retail)       [address]       [place], [place]  [address]  Ph: [phone]       Fax: [phone]   RxID:   [id number]

## 2011-01-21 NOTE — Progress Notes (Signed)
Summary: [facility]  Phone Note Call from Patient   Summary of Call: she does use [facility] so please fill out the paper and send it back Initial call taken by: Lind Guest,  August 16, 2010 10:27 AM  Follow-up for Phone Call        form to be completed and faxed today Follow-up by: Adella Hare LPN,  August 19, 2010 10:15 AM

## 2011-01-23 NOTE — Progress Notes (Signed)
Summary: MEDICINE  Phone Note Call from Patient   Summary of Call: [facility] HAS NOT RECEIVED HER LISINOPRIL PLEASE SEND AGAIN Initial call taken by: Lind Guest,  December 19, 2010 9:28 AM    Prescriptions: LISINOPRIL 40 MG TABS (LISINOPRIL) Take 1 tablet by mouth once a day  #30 Each x 0   Entered by:   Adella Hare LPN   Authorized by:   Syliva Overman MD   Signed by:   Adella Hare LPN on 16/09/9603   Method used:   Electronically to        [facility] (retail)       [address]       [place], [place]  [address]       Ph: [phone]       Fax: [phone]   RxID:   [id number]

## 2011-01-23 NOTE — Progress Notes (Signed)
Summary: MEDICINE  Phone Note Call from Patient   Summary of Call: [facility] DID NOT RECIEVE HER MEDICINES THAT WAS SENT HER WILL U PLEASE SEND AGAIN BECAUSE SOMEONE FILLS HER TRAY ON THURSDAY Initial call taken by: Lind Guest,  December 18, 2010 11:16 AM    Prescriptions: TRAZODONE HCL 100 MG  TABS (TRAZODONE HCL) Take three tablets by mouth at bedtime  #90 Each x 0   Entered by:   Adella Hare LPN   Authorized by:   Syliva Overman MD   Signed by:   Adella Hare LPN on 16/09/9603   Method used:   Electronically to        [facility] (retail)       [address]       [place], [place]  [address]       Ph: [phone]       Fax: [phone]   RxID:   [id number] KLOR-CON M20 20 MEQ  TBCR (POTASSIUM CHLORIDE CRYS CR) Take 1 tablet by mouth two times a day  #60 Each x 0   Entered by:   Adella Hare LPN   Authorized by:   Syliva Overman MD   Signed by:   Adella Hare LPN on 10/18/2535   Method used:   Electronically to        [facility] (retail)       [address]       [place], [place]  [address]       Ph: [phone]       Fax: [phone]   RxID:   [id number] LASIX 20 MG  TABS (FUROSEMIDE) Take 1 tablet by mouth once a day  #30 Each x 0   Entered by:   Adella Hare LPN   Authorized by:   Syliva Overman MD   Signed by:   Adella Hare LPN on 32/35/5732   Method used:   Electronically to        [facility] (retail)       [address]       [place], [place]  [address]       Ph: [phone]       Fax: [phone]   RxID:   [id number]

## 2011-02-06 NOTE — Assessment & Plan Note (Signed)
Summary: follow up from ov 10-11   Vital Signs:  Patient profile:   60 year old female Menstrual status:  hysterectomy Height:      58.5 inches Weight:      213.75 pounds BMI:     44.07 O2 Sat:      95 % Pulse rate:   76 / minute Pulse rhythm:   regular Resp:     16 per minute BP sitting:   140 / 92  (left arm) Cuff size:   large  Vitals Entered By: Everitt Amber LPN (January 09, 2011 8:04 AM)  Nutrition Counseling: Patient's BMI is greater than 25 and therefore counseled on weight management options. CC: Follow up chronic problems   CC:  Follow up chronic problems.  History of Present Illness: Reports  that she has not been doing veryb well. Denies recent fever or chills. Denies sinus pressure, nasal congestion , ear pain or sore throat. Denies chest congestion, or cough productive of sputum. Denies chest pain, palpitations, PND, orthopnea or leg swelling. chronic  abdominal pain,with  diarrhea Denies change in bowel movements or bloody stool. Denies dysuria , frequency, incontinence or hesitancy.  Denies headaches, vertigo, seizures. Denies depression, anxiety or insomnia. Denies  rash, lesions, or itch.     Current Medications (verified): 1)  Lasix 20 Mg  Tabs (Furosemide) .... Take 1 Tablet By Mouth Once A Day 2)  Klor-Con M20 20 Meq  Tbcr (Potassium Chloride Crys Cr) .... Take 1 Tablet By Mouth Two Times A Day 3)  Trazodone Hcl 100 Mg  Tabs (Trazodone Hcl) .... Take Three Tablets By Mouth At Bedtime 4)  Multi-Vitamin   Tabs (Multiple Vitamin) .... Take 1 Tablet By Mouth Once A Day 5)  Calcium 600/vitamin D 600-400 Mg-Unit  Chew (Calcium Carbonate-Vitamin D) .... Take 1 Tablet By Mouth Three Times A Day 6)  Aspirin 81 Mg  Tbec (Aspirin) .... Take 1 Tablet By Mouth Once A Day 7)  Bupropion Hcl 150 Mg  Tb12 (Bupropion Hcl) .... Take 1 Tablet By Mouth Two Times A Day 8)  Fluoxetine Hcl 20 Mg Caps (Fluoxetine Hcl) .... Two Caps By Mouth Once Daily. 9)  Vicodin Hp  10-660 Mg Tabs (Hydrocodone-Acetaminophen) .... Take 1 Tablet By Mouth Two Times A Day As Needed 10)  Lisinopril 40 Mg Tabs (Lisinopril) .... Take 1 Tablet By Mouth Once A Day 11)  Cyclobenzaprine Hcl 10 Mg Tabs (Cyclobenzaprine Hcl) .... Take 1 Tab By Mouth At Bedtime 12)  Simvastatin 40 Mg Tabs (Simvastatin) .... Take 1 Tab By Mouth At Bedtime 13)  Clonidine Hcl 0.2 Mg Tabs (Clonidine Hcl) .... Take 1 Tab By Mouth At Bedtime 14)  Promethazine Hcl 12.5 Mg Tabs (Promethazine Hcl) .... Take 1 Tablet By Mouth Two Times A Day As Needed For Uncontrolled Nausea  Allergies (verified): 1)  ! Penicillin  Past History:  Past Surgical History: Caesarean section x2 Carpal tunnel release bilateral Cholecystectomy Hysterectomy total Left trigger finger surgery-Dec 2011  Review of Systems      See HPI General:  Complains of fatigue. Eyes:  Denies blurring, discharge, double vision, and eye pain. ENT:  Complains of decreased hearing. MS:  Complains of joint pain, low back pain, mid back pain, and stiffness; increased bilaterla hand pain, chronic back and hip pain and stiffness. Endo:  Denies excessive hunger, excessive thirst, excessive urination, and heat intolerance. Heme:  Denies abnormal bruising and enlarge lymph nodes. Allergy:  Denies hives or rash and itching eyes.  Physical Exam  General:  Well-developed,well-nourished,in no acute distress; alert,appropriate and cooperative throughout examination HEENT: No facial asymmetry,  EOMI, No sinus tenderness, TM's Clear, oropharynx  pink and moist.   Chest: Clear to auscultation bilaterally.  CVS: S1, S2, No murmurs, No S3.   Abd: Soft, Nontender.  MS: decreased ROM spine, hips, shoulders and knees.  Ext: No edema.   CNS: CN 2-12 intact, power tone and sensation normal throughout.   Skin: Intact, no visible lesions or rashes.  Psych: Good eye contact, normal affect.  Memory intact, not anxious or depressed appearing.    Impression &  Recommendations:  Problem # 1:  TREMOR (ICD-781.0) Assessment Improved  Problem # 2:  DIABETES MELLITUS, TYPE II, WITHOUT COMPLICATIONS (ICD-250.00) Assessment: Unchanged  Her updated medication list for this problem includes:    Aspirin 81 Mg Tbec (Aspirin) .Marland Kitchen... Take 1 tablet by mouth once a day    Lisinopril 40 Mg Tabs (Lisinopril) .Marland Kitchen... Take 1 tablet by mouth once a day    Metformin Hcl 500 Mg Tabs (Metformin hcl) .Marland Kitchen... Take 1 tablet by mouth once a day for fasting blood sugars over 120  Orders: T- Hemoglobin A1C ([address]093-23557)  Labs Reviewed: Creat: 0.86 (01/02/2011)    Reviewed HgBA1c results: 6.0 (01/02/2011)  5.8 (09/30/2010)  Problem # 3:  HYPERTENSION (ICD-401.9) Assessment: Improved  Her updated medication list for this problem includes:    Lasix 20 Mg Tabs (Furosemide) .Marland Kitchen... Take 1 tablet by mouth once a day    Lisinopril 40 Mg Tabs (Lisinopril) .Marland Kitchen... Take 1 tablet by mouth once a day    Clonidine Hcl 0.2 Mg Tabs (Clonidine hcl) .Marland Kitchen... Take 1 tab by mouth at bedtime  Orders: T-CMP with estimated GFR ([address])  BP today: 140/92 Prior BP: 148/102 (10/14/2010)  Labs Reviewed: K+: 4.3 (01/02/2011) Creat: : 0.86 (01/02/2011)   Chol: 169 (10/14/2010)   HDL: 65 (10/14/2010)   LDL: 58 (10/14/2010)   TG: 228 (10/14/2010)  Problem # 4:  HYPERLIPIDEMIA (ICD-272.4) Assessment: Comment Only  Her updated medication list for this problem includes:    Simvastatin 40 Mg Tabs (Simvastatin) .Marland Kitchen... Take 1 tab by mouth at bedtime Low fat dietdiscussed and encouraged  Orders: T-Lipid Profile [id number])  Labs Reviewed: SGOT: 21 (10/14/2010)   SGPT: 19 (10/14/2010)   HDL:65 (10/14/2010), 65 (05/23/2010)  LDL:58 (10/14/2010), 55 (05/23/2010)  Chol:169 (10/14/2010), 152 (05/23/2010)  Trig:228 (10/14/2010), 160 (05/23/2010)  Problem # 5:  HAND PAIN, RIGHT (ICD-729.5) Assessment: Deteriorated  Orders: Medicare Electronic Prescription (D1761) Depo- Medrol 80mg   (J1040)  Complete Medication List: 1)  Lasix 20 Mg Tabs (Furosemide) .... Take 1 tablet by mouth once a day 2)  Klor-con M20 20 Meq Tbcr (Potassium chloride crys cr) .... Take 1 tablet by mouth two times a day 3)  Trazodone Hcl 100 Mg Tabs (Trazodone hcl) .... Take three tablets by mouth at bedtime 4)  Multi-vitamin Tabs (Multiple vitamin) .... Take 1 tablet by mouth once a day 5)  Calcium 600/vitamin D 600-400 Mg-unit Chew (Calcium carbonate-vitamin d) .... Take 1 tablet by mouth three times a day 6)  Aspirin 81 Mg Tbec (Aspirin) .... Take 1 tablet by mouth once a day 7)  Bupropion Hcl 150 Mg Tb12 (Bupropion hcl) .... Take 1 tablet by mouth two times a day 8)  Fluoxetine Hcl 20 Mg Caps (Fluoxetine hcl) .... Two caps by mouth once daily. 9)  Vicodin Hp 10-660 Mg Tabs (Hydrocodone-acetaminophen) .... Take 1 tablet by mouth two times a day as needed 10)  Lisinopril 40  Mg Tabs (Lisinopril) .... Take 1 tablet by mouth once a day 11)  Cyclobenzaprine Hcl 10 Mg Tabs (Cyclobenzaprine hcl) .... Take 1 tab by mouth at bedtime 12)  Simvastatin 40 Mg Tabs (Simvastatin) .... Take 1 tab by mouth at bedtime 13)  Clonidine Hcl 0.2 Mg Tabs (Clonidine hcl) .... Take 1 tab by mouth at bedtime 14)  Promethazine Hcl 12.5 Mg Tabs (Promethazine hcl) .... Take 1 tablet by mouth two times a day as needed for uncontrolled nausea 15)  Mobic 15 Mg Tabs (Meloxicam) .... Take 1 tablet by mouth once a day 16)  Pantoprazole Sodium 40 Mg Tbec (Pantoprazole sodium) .... Take 1 tablet by mouth once a day 17)  Metformin Hcl 500 Mg Tabs (Metformin hcl) .... Take 1 tablet by mouth once a day for fasting blood sugars over 120  Other Orders: Ketorolac-Toradol 15mg  (Z6109) Admin of Therapeutic Inj  intramuscular or subcutaneous (60454)  Patient Instructions: 1)  Please schedule a follow-up appointment in 3 months. 2)  It is important that you exercise regularly at least 20 minutes 5 times a week. If you develop chest pain, have  severe difficulty breathing, or feel very tired , stop exercising immediately and seek medical attention. 3)  You need to lose weight. Consider a lower calorie diet and regular exercise.  4)  Check your blood sugars regularly. If your readings are usually above 250 or below 70 you should contact our office. 5)  You will be prescribed med for the severe pain in your hands, I trust that it helps. 6)  You will use metformin only if your  fasting sugar is over 120. 7)  New med to protect your stomach , since you are  on chronic anti-inflammatories. 8)  Two injections in the office today, Yoradol 60mg  , and depomedrol 80mg  IM 9)  We will provide number to call re weight loss surgery. 10)  Lipid Panel prior to visit, ICD-9: 11)  HbgA1C prior to visit, ICD-9: 12)  CMP and egfr   in 3 months Prescriptions: LISINOPRIL 40 MG TABS (LISINOPRIL) Take 1 tablet by mouth once a day  #30 Each x 3   Entered by:   Adella Hare LPN   Authorized by:   Syliva Overman MD   Signed by:   Adella Hare LPN on 09/81/1914   Method used:   Electronically to        [facility] (retail)       [address]       [place], [place]  [address]       Ph: [phone]       Fax: [phone]   RxID:   [id number] TRAZODONE HCL 100 MG  TABS (TRAZODONE HCL) Take three tablets by mouth at bedtime  #90 Each x 3   Entered by:   Adella Hare LPN   Authorized by:   Syliva Overman MD   Signed by:   Adella Hare LPN on 40/34/7425   Method used:   Electronically to        [facility] (retail)       [address]       [place], [place]  [address]       Ph: [phone]       Fax: [phone]   RxID:   [id number] KLOR-CON M20 20 MEQ  TBCR (POTASSIUM CHLORIDE CRYS CR) Take  1 tablet by mouth two times a day  #60 Each x 3   Entered by:   Adella Hare LPN   Authorized by:   Syliva Overman MD   Signed by:   Adella Hare LPN on 04/54/0981   Method used:    Electronically to        [facility] (retail)       [address]       [place], [place]  [address]       Ph: [phone]       Fax: [phone]   RxID:   [id number] METFORMIN HCL 500 MG TABS (METFORMIN HCL) Take 1 tablet by mouth once a day for fasting blood sugars over 120  #30 x 0   Entered and Authorized by:   Syliva Overman MD   Signed by:   Syliva Overman MD on 01/09/2011   Method used:   Electronically to        [facility] (retail)       [address]       [place], [place]  [address]       Ph: [phone]       Fax: [phone]   RxID:   [id number] PANTOPRAZOLE SODIUM 40 MG TBEC (PANTOPRAZOLE SODIUM) Take 1 tablet by mouth once a day  #30 x 3   Entered and Authorized by:   Syliva Overman MD   Signed by:   Syliva Overman MD on 01/09/2011   Method used:   Electronically to        [facility] (retail)       [address]       [place], [place]  [address]       Ph: [phone]       Fax: [phone]   RxID:   [id number] MOBIC 15 MG TABS (MELOXICAM) Take 1 tablet by mouth once a day  #30 x 3   Entered and Authorized by:   Syliva Overman MD   Signed by:   Syliva Overman MD on 01/09/2011   Method used:   Electronically to        [facility] (retail)       [address]       [place], [place]  [address]       Ph: [phone]       Fax: [phone]   RxID:   [id number] PREDNISONE (PAK) 5 MG TABS (PREDNISONE) Use as directed  #21 x 0   Entered and Authorized by:   Syliva Overman MD   Signed by:   Syliva Overman MD on 01/09/2011   Method used:   Electronically to        [facility] (retail)       [address]       [place], [place]  [address]       Ph: [phone]       Fax: [phone]   RxID:   [id number]    Medication Administration  Injection # 1:    Medication:  Depo- Medrol 80mg     Diagnosis: HAND PAIN, BILATERAL (ICD-729.5)    Route: IM    Site: RUOQ gluteus    Exp  Date: 07/12    Lot #: Gunnar [id number]    Mfr: Pharmacia    Patient tolerated injection without complications    Given by: Adella Hare LPN (January 09, 2011 9:44 AM)  Injection # 2:    Medication: Ketorolac-Toradol 15mg     Diagnosis: HAND PAIN, BILATERAL (ICD-729.5)    Route: IM    Site: LUOQ gluteus    Exp Date: 05/22/2012    Lot #: [id number]    Mfr: NOVAPLUS    Comments: TORADOL 60MG  GIVEN    Patient tolerated injection without complications    Given by: Adella Hare LPN (January 09, 2011 9:45 AM)  Orders Added: 1)  Est. Patient Level IV [45409] 2)  Medicare Electronic Prescription [G8553] 3)  T-CMP with estimated GFR [[address]] 4)  T-Lipid Profile [80061-22930] 5)  T- Hemoglobin A1C [83036-23375] 6)  Depo- Medrol 80mg  [J1040] 7)  Ketorolac-Toradol 15mg  [J1885] 8)  Admin of Therapeutic Inj  intramuscular or subcutaneous [96372]     Medication Administration  Injection # 1:    Medication: Depo- Medrol 80mg     Diagnosis: HAND PAIN, BILATERAL (ICD-729.5)    Route: IM    Site: RUOQ gluteus    Exp Date: 07/12    Lot #: Gunnar [id number]    Mfr: Pharmacia    Patient tolerated injection without complications    Given by: Adella Hare LPN (January 09, 2011 9:44 AM)  Injection # 2:    Medication: Ketorolac-Toradol 15mg     Diagnosis: HAND PAIN, BILATERAL (ICD-729.5)    Route: IM    Site: LUOQ gluteus    Exp Date: 05/22/2012    Lot #: [id number]    Mfr: NOVAPLUS    Comments: TORADOL 60MG  GIVEN    Patient tolerated injection without complications    Given by: Adella Hare LPN (January 09, 2011 9:45 AM)  Orders Added: 1)  Est. Patient Level IV [82956] 2)  Medicare Electronic Prescription [G8553] 3)  T-CMP with estimated GFR [[address]] 4)  T-Lipid Profile [80061-22930] 5)  T- Hemoglobin A1C [83036-23375] 6)  Depo- Medrol 80mg  [J1040] 7)  Ketorolac-Toradol 15mg  [J1885] 8)  Admin  of Therapeutic Inj  intramuscular or subcutaneous [21308]

## 2011-03-25 ENCOUNTER — Other Ambulatory Visit: Payer: Self-pay | Admitting: Family Medicine

## 2011-03-26 ENCOUNTER — Other Ambulatory Visit: Payer: Self-pay

## 2011-03-26 MED ORDER — HYDROCODONE-ACETAMINOPHEN 10-660 MG PO TABS: 1.0000 | ORAL_TABLET | Freq: Two times a day (BID) | ORAL | Status: AC | PRN

## 2011-03-27 ENCOUNTER — Other Ambulatory Visit: Payer: Self-pay | Admitting: Family Medicine

## 2011-03-27 LAB — LIPID PANEL
HDL: 62 mg/dL (ref >39)
Total CHOL/HDL Ratio: 3 Ratio

## 2011-03-27 LAB — HEMOGLOBIN A1C: Hgb A1c MFr Bld: 5.8 % — ABNORMAL HIGH (ref <5.7)

## 2011-03-28 LAB — COMPLETE METABOLIC PANEL WITH GFR
ALT: 17 U/L (ref 0–35)
Alkaline Phosphatase: 43 U/L (ref 39–117)
GFR, Est Non African American: >60 mL/min (ref >60)
Glucose, Bld: 97 mg/dL (ref 70–99)
Sodium: 139 mEq/L (ref 135–145)
Total Bilirubin: 0.2 mg/dL — ABNORMAL LOW (ref 0.3–1.2)
Total Protein: 5.9 g/dL — ABNORMAL LOW (ref 6.0–8.3)

## 2011-04-14 ENCOUNTER — Encounter: Payer: Self-pay | Admitting: Family Medicine

## 2011-04-15 ENCOUNTER — Encounter: Payer: Self-pay | Admitting: Family Medicine

## 2011-04-16 ENCOUNTER — Encounter: Payer: Self-pay | Admitting: Family Medicine

## 2011-04-17 ENCOUNTER — Encounter: Payer: Self-pay | Admitting: Family Medicine

## 2011-04-17 ENCOUNTER — Ambulatory Visit (INDEPENDENT_AMBULATORY_CARE_PROVIDER_SITE_OTHER): Payer: MEDICARE | Admitting: Family Medicine

## 2011-04-17 VITALS — BP 154/94 | HR 74 | Resp 16 | Ht 58.25 in | Wt 217.4 lb

## 2011-04-17 DIAGNOSIS — F3289 Other specified depressive episodes: Secondary | ICD-10-CM

## 2011-04-17 DIAGNOSIS — K219 Gastro-esophageal reflux disease without esophagitis: Secondary | ICD-10-CM

## 2011-04-17 DIAGNOSIS — E785 Hyperlipidemia, unspecified: Secondary | ICD-10-CM

## 2011-04-17 DIAGNOSIS — R5381 Other malaise: Secondary | ICD-10-CM

## 2011-04-17 DIAGNOSIS — I10 Essential (primary) hypertension: Secondary | ICD-10-CM

## 2011-04-17 DIAGNOSIS — R5383 Other fatigue: Secondary | ICD-10-CM

## 2011-04-17 DIAGNOSIS — F329 Major depressive disorder, single episode, unspecified: Secondary | ICD-10-CM

## 2011-04-17 DIAGNOSIS — E119 Type 2 diabetes mellitus without complications: Secondary | ICD-10-CM

## 2011-04-17 MED ORDER — MELOXICAM 15 MG PO TABS: 15.0000 mg | ORAL_TABLET | Freq: Every day | ORAL | Status: DC

## 2011-04-17 MED ORDER — PANTOPRAZOLE SODIUM 40 MG PO TBEC: 40.0000 mg | DELAYED_RELEASE_TABLET | Freq: Every day | ORAL | Status: DC

## 2011-04-17 MED ORDER — CLONIDINE HCL 0.2 MG PO TABS: 0.2000 mg | ORAL_TABLET | Freq: Every day | ORAL | Status: DC

## 2011-04-17 MED ORDER — POTASSIUM CHLORIDE CRYS ER 20 MEQ PO TBCR: 20.0000 meq | EXTENDED_RELEASE_TABLET | Freq: Two times a day (BID) | ORAL | Status: DC

## 2011-04-17 MED ORDER — TRAZODONE HCL 100 MG PO TABS: 100.0000 mg | ORAL_TABLET | Freq: Every day | ORAL | Status: DC

## 2011-04-17 MED ORDER — METFORMIN HCL 500 MG PO TABS: 500.0000 mg | ORAL_TABLET | Freq: Every day | ORAL | Status: DC

## 2011-04-17 MED ORDER — SIMVASTATIN 40 MG PO TABS: 40.0000 mg | ORAL_TABLET | Freq: Every day | ORAL | Status: DC

## 2011-04-17 MED ORDER — FLUOXETINE HCL 20 MG PO CAPS: 20.0000 mg | ORAL_CAPSULE | Freq: Two times a day (BID) | ORAL | Status: DC

## 2011-04-17 MED ORDER — LISINOPRIL 40 MG PO TABS: 40.0000 mg | ORAL_TABLET | Freq: Every day | ORAL | Status: DC

## 2011-04-17 MED ORDER — FUROSEMIDE 20 MG PO TABS: 20.0000 mg | ORAL_TABLET | Freq: Every day | ORAL | Status: DC

## 2011-04-17 MED ORDER — BUPROPION HCL ER (SR) 150 MG PO TB12: 150.0000 mg | ORAL_TABLET | Freq: Two times a day (BID) | ORAL | Status: DC

## 2011-04-17 NOTE — Progress Notes (Signed)
Addended by: Everitt Amber on: 04/17/2011 08:55 AM   Modules accepted: Orders

## 2011-04-17 NOTE — Patient Instructions (Addendum)
F/u in 5 months.  It is important that you exercise regularly at least 30 minutes 5 times a week. If you develop chest pain, have severe difficulty breathing, or feel very tired, stop exercising immediately and seek medical attention   No changes in medication, your labs have all improved. HBA1C in 5 months and fasting lipid hepatic , chem 7, CBC in 5 months.  All the best with your colonoscopy.  Pls schedule  an eye exam and your mammogram

## 2011-04-17 NOTE — Progress Notes (Signed)
  Subjective:    Patient ID: Adrienne Nelson, female    DOB: 25-Jun-1951, 60 y.o.   MRN: [id number]  HPI The PT is here for follow up and re-evaluation of chronic medical conditions, medication management and review of recent lab and radiology data.  Preventive health is updated, specifically  Cancer screening, Osteoporosis screening and Immunization.   Questions or concerns regarding consultations or procedures which the PT has had in the interim are  addressed. The PT denies any adverse reactions to current medications since the last visit.  There are no new concerns. States the meloxicam is a miracle drug for her hand pain There are no specific complaints She tests her sugars on avg 4 times per week, seldom over 104, often not taking metformin as a result       Review of Systems Denies recent fever or chills. Denies sinus pressure, nasal congestion, ear pain or sore throat. Denies chest congestion, productive cough or wheezing. Denies chest pains, palpitations, paroxysmal nocturnal dyspnea, orthopnea and leg swelling Denies abdominal pain, nausea, vomiting, constipation.  Denies rectal bleeding or change in bowel movement.Her colitis is much worse, diareah all the time, a possible s/e of her med for tremor Denies dysuria, frequency, hesitancy or incontinence. Chronic  joint pain, swelling and limitation in mobility. Denies headaches, seizure, numbness, or tingling. Denies depression, anxiety or insomnia. Denies skin break down or rash.        Objective:   Physical Exam    Patient alert and oriented and in no Cardiopulmonary distress.  HEENT: No facial asymmetry, EOMI, no sinus tenderness, TM's clear, Oropharynx pink and moist.  Neck supple no adenopathy.  Chest: Clear to auscultation bilaterally.  CVS: S1, S2 no murmurs, no S3.  ABD: Soft non tender. Bowel sounds normal.  Ext: No edema  MS: Decreased  ROM spine, shoulders, hips and knees.  Skin: Intact, no ulcerations  or rash noted.  Psych: Good eye contact, normal affect. Memory intact not anxious or depressed appearing.  CNS: CN 2-12 intact Severe hearing losss, power, tone and sensation normal throughout.  Diabetic Foot Check:  Appearance - no lesions, ulcers or calluses Skin - no unusual pallor or redness Sensation - grossly intact to light touch Monofilament testing -  Right - Great toe, medial, central, lateral ball and posterior foot  normal Left - Great toe, medial, central, lateral ball and posterior foot normal Pulses Left - Dorsalis Pedis and Posterior Tibia normal Right - Dorsalis Pedis and Posterior Tibia normal    Assessment & Plan:  1.Hypertension:Controlled, no changes in medication.  2. Diabetes: improved , pt maintains her blood sugars off medication 3.Obesity: Unchanged, encouraged reduce intake and increased activity 4.Nicotine : smokes about 3/week, encouraged to quit 5.Depression and anxiety: stable. Hyperlipidemia: improved

## 2011-04-29 ENCOUNTER — Telehealth: Payer: Self-pay | Admitting: Family Medicine

## 2011-04-29 NOTE — Telephone Encounter (Signed)
Advised urgent care, patient agrees

## 2011-05-05 ENCOUNTER — Telehealth: Payer: Self-pay | Admitting: Family Medicine

## 2011-05-05 ENCOUNTER — Other Ambulatory Visit (INDEPENDENT_AMBULATORY_CARE_PROVIDER_SITE_OTHER): Payer: MEDICARE | Admitting: *Deleted

## 2011-05-05 DIAGNOSIS — F519 Sleep disorder not due to a substance or known physiological condition, unspecified: Secondary | ICD-10-CM

## 2011-05-05 MED ORDER — TRAZODONE HCL 100 MG PO TABS: ORAL_TABLET | ORAL | Status: DC

## 2011-05-05 NOTE — Telephone Encounter (Signed)
Med resent 

## 2011-05-09 NOTE — Consult Note (Signed)
NAMEWAVA, KILDOW               ACCOUNT NO.:  [id number]   MEDICAL RECORD NO.:  [id number]            PATIENT TYPE:   LOCATION:                                 FACILITY:   PHYSICIAN:  Lionel December, M.D.    DATE OF BIRTH:  [birth year]   DATE OF CONSULTATION:  05/28/2005  DATE OF DISCHARGE:                                   CONSULTATION   REQUESTING PHYSICIAN:  Milus Mallick. Lodema Hong, M.D.   REASON FOR CONSULTATION:  Bloody diarrhea, history of  ulcerative colitis.   HISTORY OF PRESENT ILLNESS:  Adrienne Nelson is a 60 year old Caucasian female  patient of Dr. Lodema Hong.  She reports interesting history.  About 27 years  ago, she was diagnosed with spastic colon.  She states in the 1990s she  was seen at [facility] and felt to have ulcerative colitis.  She has never been on  any maintenance therapy.  She continues to have daily diarrhea.  About two  and one-half months ago she had significant bloody diarrhea in which she  noted bright blood on the toilet paper and in the water and stool.  She  usually has 30+ bloody stools per day per her report.  Some of the bleeding  has decreased frequency recently.  She also complains of crampy bilateral  lower quadrant abdominal pain, left greater than right, as well as some  nausea.  Denies any vomiting, heartburn, indigestion, dysphagia,  odynophagia.  Last colonoscopy was in 1998.  She notes she had polyps,  unsure if these were adenomatous.  She had a father diagnosed with colon  cancer in his late 68s, deceased at age 59.   PAST MEDICAL HISTORY:  1.  Chronic diarrhea, possible ulcerative colitis. See HPI.  2.  Right arm tendinitis.  3.  Depression.  4.  Hypertension.  5.  Hyperlipidemia.  6.  Squamous cell throat cancer diagnosed in 2003, stage I, treated with      radiation, treated in [place], [place].  She is followed by Dr. Katrinka Blazing      in [place] for this.  7.  Bilateral carpal tunnel repair.  8.  C section x 2.  9.  Renal lithiasis removal.  10. Cholecystectomy in 1980 secondary to cholelithiasis.   CURRENT MEDICATIONS:  1.  Effexor XR 75 mg daily.  2.  Lisinopril 20 mg 2 tablets daily.  3.  Hydrochlorothiazide 25 mg daily.  4.  Lasix 25 mg daily.  5.  Aspirin 325 mg daily.  6.  Hydrocodone/APAP 10/500 mg daily.  7.  Zocor 40 mg daily.  8.  Trazodone 100 mg 2 tablets daily.  9.  Nifediac 30 mg daily.  10. Vitorin 10/20 mg daily.  11. Klor-Con 20 mEq 2 tablets daily.  12. Multivitamin daily.  13. Calcium 1300 mg daily.   ALLERGIES:  PENICILLIN.   FAMILY HISTORY:  Positive for father diagnosed with colon cancer in late  50s, deceased at age 68.  Mother deceased at age 65 secondary to lung  carcinoma.  She has one sister with hypertension and now one brother in  generally poor  health.   SOCIAL HISTORY:  [name] is currently a widow.  She lives alone.  She has  two grown and healthy children.  She is currently disabled.  She reports a  five-year tobacco use, history of about two packs a day; however, she has  recently quit.  Denies alcohol or drug use.   REVIEW OF SYSTEMS:  CONSTITUTIONAL:  Weight: She has gained 60 pounds in the  last two years.  She is complaining of some fatigue.  Denies anorexia or  early satiety.  CARDIOVASCULAR;  Denies chest pain or palpitations.  PULMONARY:  Denies shortness of breath, dyspnea, cough, hemoptysis. GI: See  HPI.  GU: Denies dysuria, hematuria, urgency, frequency.   PHYSICAL EXAMINATION:  VITAL SIGNS:  Weight 240 pounds.  Height 59 inches.  Temperature 98, blood pressure 136/70, pulse 74.  GENERAL:  Adrienne Nelson is a 60 year old, obese, Caucasian female who is alert,  pleasant, cognitive, no acute distress.  HEENT:  Sclerae clear.  Conjunctivae pink.  Oropharynx pink and moist.  Upper and lower dentures intact without lesions.  NECK:  Supple without mass or thyromegaly.  HEART:  Regular rate and rhythm with normal S1 and S2. Without murmurs,  rubs, or gallops.  LUNGS:  Clear  to auscultation bilaterally.  ABDOMEN:  Obese.  Positive bowel sounds x 4, no bruits auscultated.  Nontender, nondistended.  No mass or hepatosplenomegaly.  No rebound  tenderness or guarding.  RECTAL:  Declined by patient preference.  EXTREMITIES:  1+ bilateral lower extremity edema.   LABORATORY DATA:  Apparently she has had a recent CBC, CMP, LFTs, and some  other studies done through Dr. Anthony Sar office. We will request these  records.   CT of abdomen and pelvis without contrast from August 2005 was normal.   IMPRESSION:  Adrienne Nelson is a 60 year old Caucasian female with chronic large-  volume bloody diarrhea on a daily basis.  She describes history of  ulcerative colitis diagnosed in the 1990s.  She has never been on therapy,  however, for ulcerative colitis other than antidiarrheals or antispasmodics.  She notes she has tried multiple regimens in the past to control diarrhea  including Imodium and antispasmodics with minimal relief.  She notes recent  laboratory studies through Dr. Anthony Sar office have been normal.  She has  had a multitude of stools studies in the past.  Ultimately, this lady is  going to need a colonoscopy, given her family history of colon cancer,  personal history of polyps, and possible history of ulcerative colitis, for  further evaluation and biopsies to determine the next step in the best  treatment for Ms.  Goyal's chronic bloody diarrhea.   RECOMMENDATIONS:  1.  Will schedule colonoscopy with Dr. Karilyn Cota in the near future.  I have      discussed procedure including risks and benefits to include but not      limited to bleeding, infection, perforation, drug reaction.  She appears      to understand.  2.  I have asked her to add Imodium 2 mg each morning.  3.  She is given 12 NuLev samples to be taken p.r.n.  4.  Further recommendations pending colonoscopy.  5.  We have requested CBC, LFTs, metabolic panel from Dr. Anthony Sar office.  We would like  to thank Dr. Lodema Hong for allowing [place] to participate in the  care of [name].       KC/MEDQ  D:  05/28/2005  T:  05/28/2005  Job:  [id number]   cc:   Milus Mallick. Lodema Hong, M.D.  [address]  [place], [place] [address]  Fax: [phone]

## 2011-05-09 NOTE — Op Note (Signed)
NAMERAYETTE, MOGG               ACCOUNT NO.:  [id number]   MEDICAL RECORD NO.:  [id number]          PATIENT TYPE:  AMB   LOCATION:  DAY                           FACILITY:  [facility]   PHYSICIAN:  Lionel December, M.D.    DATE OF BIRTH:  Apr 12, 1951   DATE OF PROCEDURE:  06/18/2005  DATE OF DISCHARGE:                                 OPERATIVE REPORT   PROCEDURE:  Colonoscopy with multiple biopsies.   INDICATIONS:  Adrienne Nelson is a 60 year old Caucasian female with a several-year  history of ulcerative colitis, who is not on any maintenance, whose last  colonoscopy was at [facility] eight or 10 years ago.  She has chronic diarrhea  with intermittent bleeding.  She is undergoing diagnostic/surveillance  colonoscopy.  She reminds me that she had polyps her last exam and,  furthermore, she states that her father died of colon carcinoma in his early  78s but it was diagnosed in his 11s.  The procedure risks were reviewed the  patient, informed consent was obtained.   PREMEDICATION:  Demerol 25 mg IV, Versed 6 mg IV.   FINDINGS:  Procedure performed in endoscopy suite.  The patient's vital  signs and O2 saturation were monitored during procedure and remained stable.  The patient was placed in the left lateral recumbent position and a rectal  examination performed.  No abnormality noted on external or digital exam.  The Olympus video scope was placed in the rectum and advanced under vision  into sigmoid colon and beyond.  Preparation was satisfactory.  She had  liquid stool in some places that had to be washed away to examine the  underlying mucosa.  The scope was passed to the cecum, which was identified  by ileocecal valve and appendiceal orifice.  Pictures taken for the record.  As the scope was withdrawn, colonic mucosa was carefully examined and the  mucosal pattern was normal throughout.  There was a single small polyp of  the rectum, which was ablated via cold biopsy.  Random biopsies taken from  right colon, transverse colon, descending and sigmoid colon and finally from  the rectum.  While in the rectum, the scope was retroflexed to examine  anorectal junction and small hemorrhoids were noted below the dentate line.  Endoscope was straightened and withdrawn.  The patient tolerated the  procedure well.   FINAL DIAGNOSES:  1.  Normal colonoscopy.  Random biopsies taken from different areas looking      for dysplasia, given history of ulcerative colitis of greater than 10      years.  2.  Tiny rectal polyp, which was ablated via cold biopsy.  3.  Small external hemorrhoids.  4.  As far the as the chronic diarrhea is concerned, she could also have      irritable bowel syndrome.  She may also have a collagenous colitis, and      histology should help.   RECOMMENDATIONS:  1.  She will resume her usual medications.  2.  Dicyclomine 10 mg before each meal and Fiber Choice two tablets to be  chewed q.d.  3.  I will be contacting the patient with biopsy results and further      recommendations.       NR/MEDQ  D:  06/18/2005  T:  06/18/2005  Job:  [id number]   cc:   Milus Mallick. Lodema Hong, M.D.  [address]  [place], [place] [address]  Fax: [phone]

## 2011-05-20 ENCOUNTER — Telehealth: Payer: Self-pay | Admitting: Family Medicine

## 2011-05-20 NOTE — Telephone Encounter (Signed)
Will complete as requested.

## 2011-07-09 ENCOUNTER — Other Ambulatory Visit: Payer: Self-pay | Admitting: Family Medicine

## 2011-07-09 DIAGNOSIS — Z139 Encounter for screening, unspecified: Secondary | ICD-10-CM

## 2011-08-07 ENCOUNTER — Other Ambulatory Visit: Payer: Self-pay | Admitting: Family Medicine

## 2011-08-17 ENCOUNTER — Other Ambulatory Visit: Payer: Self-pay | Admitting: Family Medicine

## 2011-08-19 ENCOUNTER — Telehealth: Payer: Self-pay | Admitting: Family Medicine

## 2011-08-19 MED ORDER — TRAZODONE HCL 100 MG PO TABS: ORAL_TABLET | ORAL | Status: DC

## 2011-08-19 NOTE — Telephone Encounter (Signed)
Sent in per request 

## 2011-08-25 ENCOUNTER — Other Ambulatory Visit: Payer: Self-pay | Admitting: Family Medicine

## 2011-08-26 ENCOUNTER — Ambulatory Visit (HOSPITAL_COMMUNITY)
Admission: RE | Admit: 2011-08-26 | Discharge: 2011-08-26 | Disposition: A | Discharge status: Home / Self Care | Payer: MEDICARE | Source: Ambulatory Visit | Attending: Family Medicine | Admitting: Family Medicine

## 2011-08-26 DIAGNOSIS — Z139 Encounter for screening, unspecified: Secondary | ICD-10-CM

## 2011-08-26 DIAGNOSIS — Z1231 Encounter for screening mammogram for malignant neoplasm of breast: Secondary | ICD-10-CM | POA: Insufficient documentation

## 2011-08-27 LAB — HEPATIC FUNCTION PANEL
ALT: 13 U/L (ref 0–35)
AST: 17 U/L (ref 0–37)
Alkaline Phosphatase: 43 U/L (ref 39–117)
Bilirubin, Direct: <0.1 mg/dL (ref 0.0–0.3)

## 2011-08-27 LAB — CBC WITH DIFFERENTIAL/PLATELET
Basophils Absolute: 0.1 10*3/uL (ref 0.0–0.1)
Lymphocytes Relative: 23 % (ref 12–46)
Neutro Abs: 2.6 10*3/uL (ref 1.7–7.7)
Neutrophils Relative %: 50 % (ref 43–77)
Platelets: 247 10*3/uL (ref 150–400)
RDW: 14.5 % (ref 11.5–15.5)
WBC: 5.2 10*3/uL (ref 4.0–10.5)

## 2011-08-27 LAB — LIPID PANEL
Cholesterol: 185 mg/dL (ref 0–200)
LDL Cholesterol: 79 mg/dL (ref 0–99)
VLDL: 50 mg/dL — ABNORMAL HIGH (ref 0–40)

## 2011-08-27 LAB — HEMOGLOBIN A1C
Hgb A1c MFr Bld: 6 % — ABNORMAL HIGH (ref <5.7)
Mean Plasma Glucose: 126 mg/dL — ABNORMAL HIGH (ref <117)

## 2011-08-27 LAB — BASIC METABOLIC PANEL
BUN: 10 mg/dL (ref 6–23)
Chloride: 102 mEq/L (ref 96–112)
Potassium: 4.6 mEq/L (ref 3.5–5.3)

## 2011-08-28 ENCOUNTER — Telehealth: Payer: Self-pay | Admitting: Family Medicine

## 2011-08-28 NOTE — Telephone Encounter (Signed)
Primidone 50mg  two tabs at bedtime, was taking for essential tremor, med is giving terrible nausea for last three weeks, states it is not helping tremor either. Wants to stop med but wants to know if it is ok. Dr Ninetta Lights prescribed and is no longer in the area. Wants to know if it is safe to stop med.

## 2011-08-28 NOTE — Telephone Encounter (Signed)
I would advise a wean off of the med start one at night for 2 weeks, then one evry other night , for 2 weeks then stop

## 2011-08-29 NOTE — Telephone Encounter (Signed)
Patient aware.

## 2011-09-16 ENCOUNTER — Encounter: Payer: Self-pay | Admitting: Family Medicine

## 2011-09-17 ENCOUNTER — Ambulatory Visit (INDEPENDENT_AMBULATORY_CARE_PROVIDER_SITE_OTHER): Payer: MEDICARE | Admitting: Family Medicine

## 2011-09-17 ENCOUNTER — Encounter: Payer: Self-pay | Admitting: Family Medicine

## 2011-09-17 VITALS — BP 130/84 | HR 83 | Resp 16 | Ht 59.0 in | Wt 210.0 lb

## 2011-09-17 DIAGNOSIS — E049 Nontoxic goiter, unspecified: Secondary | ICD-10-CM

## 2011-09-17 DIAGNOSIS — E785 Hyperlipidemia, unspecified: Secondary | ICD-10-CM

## 2011-09-17 DIAGNOSIS — F3289 Other specified depressive episodes: Secondary | ICD-10-CM

## 2011-09-17 DIAGNOSIS — E119 Type 2 diabetes mellitus without complications: Secondary | ICD-10-CM

## 2011-09-17 DIAGNOSIS — F329 Major depressive disorder, single episode, unspecified: Secondary | ICD-10-CM

## 2011-09-17 DIAGNOSIS — M1710 Unilateral primary osteoarthritis, unspecified knee: Secondary | ICD-10-CM

## 2011-09-17 DIAGNOSIS — E669 Obesity, unspecified: Secondary | ICD-10-CM

## 2011-09-17 DIAGNOSIS — I10 Essential (primary) hypertension: Secondary | ICD-10-CM

## 2011-09-17 MED ORDER — LISINOPRIL 40 MG PO TABS: 40.0000 mg | ORAL_TABLET | Freq: Every day | ORAL | Status: DC

## 2011-09-17 MED ORDER — SIMVASTATIN 40 MG PO TABS: 40.0000 mg | ORAL_TABLET | Freq: Every day | ORAL | Status: DC

## 2011-09-17 MED ORDER — HYDROCODONE-ACETAMINOPHEN 10-660 MG PO TABS: 1.0000 | ORAL_TABLET | Freq: Two times a day (BID) | ORAL | Status: DC | PRN

## 2011-09-17 MED ORDER — FUROSEMIDE 20 MG PO TABS: 20.0000 mg | ORAL_TABLET | Freq: Every day | ORAL | Status: DC

## 2011-09-17 MED ORDER — FLUOXETINE HCL 20 MG PO CAPS: 20.0000 mg | ORAL_CAPSULE | Freq: Two times a day (BID) | ORAL | Status: DC

## 2011-09-17 MED ORDER — BUPROPION HCL ER (SR) 150 MG PO TB12: 150.0000 mg | ORAL_TABLET | Freq: Two times a day (BID) | ORAL | Status: DC

## 2011-09-17 MED ORDER — CLONIDINE HCL 0.2 MG PO TABS: 0.2000 mg | ORAL_TABLET | Freq: Every day | ORAL | Status: DC

## 2011-09-17 NOTE — Assessment & Plan Note (Signed)
Triglycerides elevated, labs are otherwise excellent, no med change , just dietary change

## 2011-09-17 NOTE — Assessment & Plan Note (Signed)
Deteriorating with instabillty, mangaged by ortho, needs surgery reportedly

## 2011-09-17 NOTE — Assessment & Plan Note (Signed)
Controlled, no change in medication  

## 2011-09-17 NOTE — Assessment & Plan Note (Signed)
Improved. Pt applauded on succesful weight loss through lifestyle change, and encouraged to continue same. Weight loss goal set for the next several months.  

## 2011-09-17 NOTE — Progress Notes (Signed)
  Subjective:    Patient ID: Adrienne Nelson, female    DOB: 1951-11-24, 60 y.o.   MRN: [id number]  HPI  The PT is here for follow up and re-evaluation of chronic medical conditions, medication management and review of any available recent lab and radiology data.  Preventive health is updated, specifically  Cancer screening and Immunization.   Questions or concerns regarding consultations or procedures which the PT has had in the interim are  addressed. Meloxicam no longer helps with hand pain, has had injections in her right knee to no avail as far as pain  is concerned ,has instability, uses a cane most of the time, has been told surgery is the next option.states she has trigger finger issues but no interest in pursuing this now. Protonix does not seem benefecial, deny gERD symptoms, but states that nausea has worsened, no interest in gI eval Tests once daily and fasting sugars are between 90 to 110. No podiatry visit.  Eye exam past due , last was 18 months ago     Review of Systems See HPI Denies recent fever or chills. Denies sinus pressure, nasal congestion, ear pain or sore throat. Denies chest congestion, productive cough or wheezing. Denies chest pains, palpitations and leg swelling Denies abdominal pain, vomiting, or constipation.   Denies dysuria, frequency, hesitancy or incontinence.  Denies headaches, seizures, numbness, or tingling. Denies depression, anxiety or insomnia. Denies skin break down or rash.        Objective:   Physical Exam Patient alert and oriented and in no cardiopulmonary distress.  HEENT: No facial asymmetry, EOMI, no sinus tenderness,  oropharynx pink and moist.  Neck supple no adenopathy.  Chest: Clear to auscultation bilaterally.  CVS: S1, S2 no murmurs, no S3.  ABD: Soft non tender. Bowel sounds normal.  Ext: No edema  MS: Adequate ROM spine, shoulders, hips and reduced in right  knee.  Skin: Intact, no ulcerations or rash  noted.  Psych: Good eye contact, normal affect. Memory intact not anxious or depressed appearing.  CNS: CN 2-12 intact,marked hearing loss, power, tone and sensation normal throughout.  Diabetic Foot Check:  Appearance - no lesions, ulcers or calluses Skin - no unusual pallor or redness Sensation - grossly intact to light touch Monofilament testing -  Right - Great toe, medial, central, lateral ball and posterior foot intact Left - Great toe, medial, central, lateral ball and posterior foot intact Pulses Left - Dorsalis Pedis and Posterior Tibia normal Right - Dorsalis Pedis and Posterior Tibia normal       Assessment & Plan:

## 2011-09-17 NOTE — Patient Instructions (Addendum)
F/u in 4 months   Stop meloxicam and pantoprazole as discussed.  HBA1c, cmp and eGFR, tSH and lipids fasting  LABWORK  NEEDS TO BE DONE BETWEEN 3 TO 7 DAYS BEFORE YOUR NEXT SCEDULED  VISIT.  THIS WILL IMPROVE THE QUALITY OF YOUR CARE.   pls schedule your eye exam before the end of the year  pls reduce fried and fatty foods

## 2011-09-18 LAB — MICROALBUMIN / CREATININE URINE RATIO

## 2011-10-07 NOTE — Progress Notes (Signed)
Attempted to call patient today. No answer and no recording machine

## 2011-10-14 ENCOUNTER — Telehealth: Payer: Self-pay | Admitting: *Deleted

## 2011-10-14 NOTE — Telephone Encounter (Signed)
Patient states she will come by office and submit urine when she is in the area again

## 2011-10-14 NOTE — Telephone Encounter (Signed)
Message copied by Diamantina Monks on Tue Oct 14, 2011  3:06 PM ------      Message from: Syliva Overman MD E      Created: Sun Oct 05, 2011 10:35 PM       Advise pt of need to resubmit urine , specimen leaked in transit, test is microalb

## 2011-10-21 ENCOUNTER — Other Ambulatory Visit: Payer: Self-pay | Admitting: Family Medicine

## 2011-10-22 ENCOUNTER — Telehealth: Payer: Self-pay | Admitting: Family Medicine

## 2011-10-22 MED ORDER — PROMETHAZINE HCL 12.5 MG PO TABS: 25.0000 mg | ORAL_TABLET | Freq: Two times a day (BID) | ORAL | Status: DC | PRN

## 2011-10-22 NOTE — Telephone Encounter (Signed)
Med change made and patient aware

## 2011-10-22 NOTE — Telephone Encounter (Signed)
Increase dose to promethazine 25mg  one twice daily as neeeded #60 refill 2 let her know and trhe pharmacy and send in pls. If that does not work she should see GI she has bad IBS

## 2011-10-22 NOTE — Telephone Encounter (Signed)
Patient is still having nausea, promethazine 12.5mg  bid was prescribed but does not help and nausea is constant

## 2012-01-06 ENCOUNTER — Other Ambulatory Visit: Payer: Self-pay | Admitting: Family Medicine

## 2012-01-13 DIAGNOSIS — E785 Hyperlipidemia, unspecified: Secondary | ICD-10-CM | POA: Diagnosis not present

## 2012-01-13 DIAGNOSIS — E119 Type 2 diabetes mellitus without complications: Secondary | ICD-10-CM | POA: Diagnosis not present

## 2012-01-13 DIAGNOSIS — E049 Nontoxic goiter, unspecified: Secondary | ICD-10-CM | POA: Diagnosis not present

## 2012-01-14 ENCOUNTER — Encounter: Payer: Self-pay | Admitting: Family Medicine

## 2012-01-14 LAB — HEMOGLOBIN A1C: Mean Plasma Glucose: 120 mg/dL — ABNORMAL HIGH (ref <117)

## 2012-01-14 LAB — COMPLETE METABOLIC PANEL WITH GFR
Alkaline Phosphatase: 38 U/L — ABNORMAL LOW (ref 39–117)
GFR, Est Non African American: 87 mL/min (ref >60)
Glucose, Bld: 105 mg/dL — ABNORMAL HIGH (ref 70–99)
Sodium: 140 mEq/L (ref 135–145)
Total Bilirubin: 0.3 mg/dL (ref 0.3–1.2)
Total Protein: 6.1 g/dL (ref 6.0–8.3)

## 2012-01-14 LAB — LIPID PANEL
Cholesterol: 160 mg/dL (ref 0–200)
Triglycerides: 212 mg/dL — ABNORMAL HIGH (ref <150)
VLDL: 42 mg/dL — ABNORMAL HIGH (ref 0–40)

## 2012-01-16 ENCOUNTER — Ambulatory Visit (INDEPENDENT_AMBULATORY_CARE_PROVIDER_SITE_OTHER): Payer: MEDICARE | Admitting: Family Medicine

## 2012-01-16 ENCOUNTER — Encounter: Payer: Self-pay | Admitting: Family Medicine

## 2012-01-16 VITALS — BP 132/76 | HR 76 | Resp 18 | Ht 59.0 in | Wt 201.0 lb

## 2012-01-16 DIAGNOSIS — H9190 Unspecified hearing loss, unspecified ear: Secondary | ICD-10-CM

## 2012-01-16 DIAGNOSIS — K5190 Ulcerative colitis, unspecified, without complications: Secondary | ICD-10-CM

## 2012-01-16 DIAGNOSIS — I10 Essential (primary) hypertension: Secondary | ICD-10-CM

## 2012-01-16 DIAGNOSIS — E785 Hyperlipidemia, unspecified: Secondary | ICD-10-CM | POA: Diagnosis not present

## 2012-01-16 DIAGNOSIS — R5381 Other malaise: Secondary | ICD-10-CM

## 2012-01-16 DIAGNOSIS — R7309 Other abnormal glucose: Secondary | ICD-10-CM

## 2012-01-16 DIAGNOSIS — R7303 Prediabetes: Secondary | ICD-10-CM

## 2012-01-16 DIAGNOSIS — E1140 Type 2 diabetes mellitus with diabetic neuropathy, unspecified: Secondary | ICD-10-CM | POA: Insufficient documentation

## 2012-01-16 DIAGNOSIS — E669 Obesity, unspecified: Secondary | ICD-10-CM

## 2012-01-16 DIAGNOSIS — M6530 Trigger finger, unspecified finger: Secondary | ICD-10-CM

## 2012-01-16 NOTE — Assessment & Plan Note (Signed)
Improved. Pt applauded on succesful weight loss through lifestyle change, and encouraged to continue same. Weight loss goal set for the next several months.  

## 2012-01-16 NOTE — Assessment & Plan Note (Signed)
Chronic diiarrheah

## 2012-01-16 NOTE — Assessment & Plan Note (Signed)
Pt planning to obtain new hearing aids in the near  future
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
## 2012-01-16 NOTE — Assessment & Plan Note (Signed)
Improved, pt to reduce cheese intake

## 2012-01-16 NOTE — Assessment & Plan Note (Signed)
Bilateral triggers , left middle and right ring, for upcoming surgery

## 2012-01-16 NOTE — Patient Instructions (Signed)
F/U in 5.5 month.  Call if you need to see me before I am very proud and happy about your weight loss. Keep it up.   You are no longer diabetic.  All the best with your operations on hands and right knee  Stop metformin, and wellbutrin  Fasting lipid , chem 7 and hepatic in 5.5 month

## 2012-01-16 NOTE — Assessment & Plan Note (Signed)
Derteriorated, for surgery

## 2012-01-16 NOTE — Assessment & Plan Note (Addendum)
Improved with lifestyle change and weight loss, d/c metformin

## 2012-01-16 NOTE — Assessment & Plan Note (Signed)
Controlled, no change in medication  

## 2012-02-02 ENCOUNTER — Other Ambulatory Visit: Payer: Self-pay | Admitting: Family Medicine

## 2012-02-24 ENCOUNTER — Other Ambulatory Visit: Payer: Self-pay | Admitting: Family Medicine

## 2012-03-07 ENCOUNTER — Other Ambulatory Visit: Payer: Self-pay | Admitting: Family Medicine

## 2012-03-23 ENCOUNTER — Other Ambulatory Visit: Payer: Self-pay | Admitting: Family Medicine

## 2012-03-26 ENCOUNTER — Telehealth: Payer: Self-pay | Admitting: Family Medicine

## 2012-03-26 DIAGNOSIS — I10 Essential (primary) hypertension: Secondary | ICD-10-CM

## 2012-03-26 MED ORDER — FUROSEMIDE 20 MG PO TABS: 20.0000 mg | ORAL_TABLET | Freq: Every day | ORAL | Status: DC

## 2012-03-26 NOTE — Telephone Encounter (Signed)
Med sent.

## 2012-04-13 ENCOUNTER — Ambulatory Visit (INDEPENDENT_AMBULATORY_CARE_PROVIDER_SITE_OTHER): Payer: MEDICARE | Admitting: Family Medicine

## 2012-04-13 ENCOUNTER — Encounter: Payer: Self-pay | Admitting: Family Medicine

## 2012-04-13 VITALS — BP 148/96 | HR 91 | Resp 18 | Ht 59.0 in | Wt 195.1 lb

## 2012-04-13 DIAGNOSIS — L0291 Cutaneous abscess, unspecified: Secondary | ICD-10-CM

## 2012-04-13 DIAGNOSIS — W19XXXA Unspecified fall, initial encounter: Secondary | ICD-10-CM | POA: Diagnosis not present

## 2012-04-13 DIAGNOSIS — L0390 Cellulitis, unspecified: Secondary | ICD-10-CM

## 2012-04-13 MED ORDER — CEPHALEXIN 500 MG PO CAPS: 500.0000 mg | ORAL_CAPSULE | Freq: Two times a day (BID) | ORAL | Status: AC

## 2012-04-13 NOTE — Assessment & Plan Note (Signed)
Concern for cellulitis based on exam though no entry wound seen, will cover with keflex, I will call pt on Friday to recheck, no swelling to suggest DVT or superficial phleblitis, no evidence of fracture of bone as more superficial erythema and pain and pt able to ambulate. She did diabetic but well controlled

## 2012-04-13 NOTE — Patient Instructions (Signed)
Continue to watch the area on your leg Take the antibiotics as prescribed  I will call you on Friday to see how things are going If you have increased swelling or redness please call.

## 2012-04-13 NOTE — Progress Notes (Signed)
  Subjective:    Patient ID: Adrienne Nelson, female    DOB: May 10, 1951, 61 y.o.   MRN: [id number]  HPI  Fall - s/p fall 2 weeks ago, she stumbled and fell hitting a rail and her leg and knee on the ground. She had bruising over her face but this has now resolved. The fall was 2 weeks ago, she is concerned about warmth and redness to her left shin that has not resolved.+Pain in shin with touch or when her clothes rub against it.No pain with walking, does not feel like she broke anything.    Review of Systems  GEN- denies fatigue, fever, weight loss,weakness, recent illness HEENT- denies eye drainage, change in vision, nasal discharge, CVS- denies chest pain, palpitations RESP- denies SOB, cough, wheeze MSK- + joint pain, muscle aches, injury Neuro- no HA, no dizziness, no syncopy       Objective:   Physical Exam   GEN-NAD, alert and oriented x 3  HEENT-EOMI, non injected sclera, no battle sign, no raccoon eye  Skin- small healing abrasion beneath right eye, moderate area of erythema extending below left knee over shin, +warmth, TTP, no induration,no open lesion,no pustules  Left knee- normal ROM, no effusion noted,  Ankle- left- normal ROM  Pulse- DP 2+  Ext- trace pedal edema       Assessment & Plan:

## 2012-04-13 NOTE — Assessment & Plan Note (Signed)
Accidental fall, other bruising noted healing well, pt has no concerns.

## 2012-04-16 ENCOUNTER — Telehealth: Payer: Self-pay | Admitting: Family Medicine

## 2012-04-16 DIAGNOSIS — M7989 Other specified soft tissue disorders: Secondary | ICD-10-CM

## 2012-04-16 NOTE — Telephone Encounter (Signed)
Patient is aware 

## 2012-04-16 NOTE — Telephone Encounter (Signed)
Should I advise her to complete abt.

## 2012-04-16 NOTE — Telephone Encounter (Signed)
Please have her complete antibiotics I will also set her up for an ultrasound of her leg

## 2012-04-16 NOTE — Telephone Encounter (Signed)
Pt aware.

## 2012-04-19 ENCOUNTER — Ambulatory Visit (HOSPITAL_COMMUNITY)
Admission: RE | Admit: 2012-04-19 | Discharge: 2012-04-19 | Disposition: A | Discharge status: Home / Self Care | Payer: MEDICARE | Source: Ambulatory Visit | Attending: Family Medicine | Admitting: Family Medicine

## 2012-04-19 DIAGNOSIS — M7989 Other specified soft tissue disorders: Secondary | ICD-10-CM

## 2012-04-19 DIAGNOSIS — M79609 Pain in unspecified limb: Secondary | ICD-10-CM | POA: Insufficient documentation

## 2012-04-20 ENCOUNTER — Other Ambulatory Visit: Payer: Self-pay | Admitting: Family Medicine

## 2012-04-26 ENCOUNTER — Other Ambulatory Visit: Payer: Self-pay | Admitting: Family Medicine

## 2012-06-11 DIAGNOSIS — R499 Unspecified voice and resonance disorder: Secondary | ICD-10-CM | POA: Diagnosis not present

## 2012-06-11 DIAGNOSIS — J309 Allergic rhinitis, unspecified: Secondary | ICD-10-CM | POA: Diagnosis not present

## 2012-06-11 DIAGNOSIS — J383 Other diseases of vocal cords: Secondary | ICD-10-CM | POA: Diagnosis not present

## 2012-06-11 DIAGNOSIS — Z85819 Personal history of malignant neoplasm of unspecified site of lip, oral cavity, and pharynx: Secondary | ICD-10-CM | POA: Diagnosis not present

## 2012-06-14 DIAGNOSIS — J309 Allergic rhinitis, unspecified: Secondary | ICD-10-CM | POA: Diagnosis not present

## 2012-06-23 ENCOUNTER — Other Ambulatory Visit: Payer: Self-pay | Admitting: Family Medicine

## 2012-07-09 ENCOUNTER — Encounter: Payer: Self-pay | Admitting: Family Medicine

## 2012-07-09 ENCOUNTER — Ambulatory Visit (INDEPENDENT_AMBULATORY_CARE_PROVIDER_SITE_OTHER): Payer: MEDICARE | Admitting: Family Medicine

## 2012-07-09 VITALS — BP 124/84 | HR 72 | Resp 16 | Ht 59.0 in | Wt 189.0 lb

## 2012-07-09 DIAGNOSIS — E785 Hyperlipidemia, unspecified: Secondary | ICD-10-CM

## 2012-07-09 DIAGNOSIS — R7309 Other abnormal glucose: Secondary | ICD-10-CM

## 2012-07-09 DIAGNOSIS — K5190 Ulcerative colitis, unspecified, without complications: Secondary | ICD-10-CM

## 2012-07-09 DIAGNOSIS — I10 Essential (primary) hypertension: Secondary | ICD-10-CM | POA: Diagnosis not present

## 2012-07-09 DIAGNOSIS — M6530 Trigger finger, unspecified finger: Secondary | ICD-10-CM

## 2012-07-09 DIAGNOSIS — M79609 Pain in unspecified limb: Secondary | ICD-10-CM | POA: Diagnosis not present

## 2012-07-09 DIAGNOSIS — R7303 Prediabetes: Secondary | ICD-10-CM

## 2012-07-09 DIAGNOSIS — E669 Obesity, unspecified: Secondary | ICD-10-CM

## 2012-07-09 DIAGNOSIS — M79643 Pain in unspecified hand: Secondary | ICD-10-CM

## 2012-07-09 MED ORDER — PREDNISONE (PAK) 5 MG PO TABS: 5.0000 mg | ORAL_TABLET | ORAL | Status: DC

## 2012-07-09 MED ORDER — METHYLPREDNISOLONE ACETATE 80 MG/ML IJ SUSP
80.0000 mg | Freq: Once | INTRAMUSCULAR | Status: AC
  Administered 2012-07-09: 80 mg via INTRAMUSCULAR

## 2012-07-09 MED ORDER — FUROSEMIDE 20 MG PO TABS: 20.0000 mg | ORAL_TABLET | Freq: Every day | ORAL | Status: DC

## 2012-07-09 MED ORDER — KETOROLAC TROMETHAMINE 60 MG/2ML IJ SOLN
60.0000 mg | Freq: Once | INTRAMUSCULAR | Status: AC
  Administered 2012-07-09: 60 mg via INTRAMUSCULAR

## 2012-07-09 MED ORDER — SIMVASTATIN 40 MG PO TABS: 40.0000 mg | ORAL_TABLET | Freq: Every day | ORAL | Status: DC

## 2012-07-09 MED ORDER — HYDROCODONE-ACETAMINOPHEN 10-660 MG PO TABS: 1.0000 | ORAL_TABLET | Freq: Every day | ORAL | Status: DC

## 2012-07-09 MED ORDER — LISINOPRIL 40 MG PO TABS: 40.0000 mg | ORAL_TABLET | Freq: Every day | ORAL | Status: DC

## 2012-07-09 NOTE — Progress Notes (Signed)
  Subjective:    Patient ID: Adrienne Nelson, female    DOB: 11/17/1951, 61 y.o.   MRN: [id number]  HPI The PT is here for follow up and re-evaluation of chronic medical conditions, medication management and review of any available recent lab and radiology data.  Preventive health is updated, specifically  Cancer screening and Immunization.   Questions or concerns regarding consultations or procedures which the PT has had in the interim are  addressed. The PT denies any adverse reactions to current medications since the last visit.   Bilateral hand pain with weakness, has had injections in the hand in the past which have helped, no interest in this at this time due to finances  Recently had allergy testing done, no specific culprit noted , states she has severe allergic reaction to reading glasses   Review of Systems See HPI Denies recent fever or chills. Denies sinus pressure, nasal congestion, ear pain or sore throat. Denies chest congestion, productive cough or wheezing. Denies chest pains, palpitations and leg swelling Denies abdominal pain, nausea, vomiting,diarrhea or constipation.   Denies dysuria, frequency, hesitancy or incontinence.   Denies headaches, seizures, numbness, or tingling. Denies unncontrolled  depression, anxiety or insomnia.Feels better now that her sister is back in town, goes out more often Denies skin break down or rash.        Objective:   Physical Exam  Patient alert and oriented and in no cardiopulmonary distress.  HEENT: No facial asymmetry, EOMI, no sinus tenderness,  oropharynx pink and moist.  Neck supple no adenopathy.  Chest: Clear to auscultation bilaterally.  CVS: S1, S2 no murmurs, no S3.  ABD: Soft non tender. Bowel sounds normal.  Ext: No edema  MS: Adequate ROM spine, shoulders, hips and knees.  Skin: Intact, no ulcerations or rash noted.  Psych: Good eye contact, normal affect. Memory intact not anxious or depressed  appearing.  CNS: CN 2-12 intact, tone and sensation normal throughout.Grad 2 to 3 power in hands, grip is weak       Assessment & Plan:

## 2012-07-09 NOTE — Patient Instructions (Addendum)
Annual wellness in 4 month, please call if you need me before  Toradol 60mg  and depo medrol 80mg  iM today for hamd pain and prednisone dose pack prescribed also.  congrats on weight loss.   Labs today microalb, hBA1C, cmp and EGFR, lipid   Reduced dose on pain med to one at bedtime  Please plan to stop smoking, you need to , so that your reduce your risk of cancer , heart disease and stroke  Please think about quitting smoking.  This is very important for your health.  Consider setting a quit date, then cutting back or switching brands to prepare to stop.  Also think of the money you will save every day by not smoking.  Quick Tips to Quit Smoking: Fix a date i.e. keep a date in mind from when you would not touch a tobacco product to smoke  Keep yourself busy and block your mind with work loads or reading books or watching movies in malls where smoking is not allowed  Vanish off the things which reminds you about smoking for example match box, or your favorite lighter, or the pipe you used for smoking, or your favorite jeans and shirt with which you used to enjoy smoking, or the club where you used to do smoking  Try to avoid certain people places and incidences where and with whom smoking is a common factor to add on  Praise yourself with some token gifts from the money you saved by stopping smoking  Anti Smoking teams are there to help you. Join their programs  Anti-smoking Gums are there in many medical shops. Try them to quit smoking   Side-effects of Smoking: Disease caused by smoking cigarettes are emphysema, bronchitis, heart failures  Premature death  Cancer is the major side effect of smoking  Heart attacks and strokes are the quick effects of smoking causing sudden death  Some smokers lives end up with limbs amputated  Breathing problem or fast breathing is another side effect of smoking  Due to more intakes of smokes, carbon mono-oxide goes into your brain and other muscles  of the body which leads to swelling of the veins and blockage to the air passage to lungs  Carbon monoxide blocks blood vessels which leads to blockage in the flow of blood to different major body organs like heart lungs and thus leads to attacks and deaths  During pregnancy smoking is very harmful and leads to premature birth of the infant, spontaneous abortions, low weight of the infant during birth  Fat depositions to narrow and blocked blood vessels causing heart attacks  In many cases cigarette smoking caused infertility in men

## 2012-07-10 LAB — MICROALBUMIN / CREATININE URINE RATIO
Creatinine, Urine: 189.8 mg/dL
Microalb, Ur: 1.05 mg/dL (ref 0.00–1.89)

## 2012-07-10 NOTE — Assessment & Plan Note (Signed)
Excellent control with dietary management only

## 2012-07-10 NOTE — Assessment & Plan Note (Addendum)
Uncontrolled and disabling, weak grip, toradol and depo medrol administered in office and prednisone dose pack prescribed

## 2012-07-10 NOTE — Assessment & Plan Note (Signed)
Increasingly disabling, wants to hold on intervention

## 2012-07-10 NOTE — Assessment & Plan Note (Signed)
Improved. Pt applauded on succesful weight loss through lifestyle change, and encouraged to continue same. Weight loss goal set for the next several months.  

## 2012-07-10 NOTE — Assessment & Plan Note (Signed)
2 month h/o recent flare, ended 1 month ago

## 2012-07-10 NOTE — Assessment & Plan Note (Signed)
Controlled, no change in medication  

## 2012-07-10 NOTE — Assessment & Plan Note (Signed)
Hyperlipidemia:Low fat diet discussed and encouraged.  Updated labs today 

## 2012-07-12 DIAGNOSIS — R7309 Other abnormal glucose: Secondary | ICD-10-CM | POA: Diagnosis not present

## 2012-07-12 DIAGNOSIS — E785 Hyperlipidemia, unspecified: Secondary | ICD-10-CM | POA: Diagnosis not present

## 2012-07-12 LAB — HEMOGLOBIN A1C
Hgb A1c MFr Bld: 5.8 % — ABNORMAL HIGH (ref <5.7)
Mean Plasma Glucose: 120 mg/dL — ABNORMAL HIGH (ref <117)

## 2012-07-13 LAB — COMPLETE METABOLIC PANEL WITH GFR
AST: 16 U/L (ref 0–37)
BUN: 10 mg/dL (ref 6–23)
CO2: 30 mEq/L (ref 19–32)
Calcium: 9.9 mg/dL (ref 8.4–10.5)
Chloride: 107 mEq/L (ref 96–112)
Creat: 0.68 mg/dL (ref 0.50–1.10)
GFR, Est African American: >89 mL/min
Total Bilirubin: 0.3 mg/dL (ref 0.3–1.2)

## 2012-07-13 LAB — LIPID PANEL: Cholesterol: 159 mg/dL (ref 0–200)

## 2012-07-23 ENCOUNTER — Other Ambulatory Visit: Payer: Self-pay | Admitting: Family Medicine

## 2012-07-23 DIAGNOSIS — Z139 Encounter for screening, unspecified: Secondary | ICD-10-CM

## 2012-08-17 DIAGNOSIS — M76899 Other specified enthesopathies of unspecified lower limb, excluding foot: Secondary | ICD-10-CM | POA: Diagnosis not present

## 2012-08-24 ENCOUNTER — Encounter (HOSPITAL_COMMUNITY): Payer: Self-pay | Admitting: *Deleted

## 2012-08-24 ENCOUNTER — Emergency Department (HOSPITAL_COMMUNITY): Payer: MEDICARE

## 2012-08-24 ENCOUNTER — Ambulatory Visit (INDEPENDENT_AMBULATORY_CARE_PROVIDER_SITE_OTHER): Payer: MEDICARE | Admitting: Family Medicine

## 2012-08-24 ENCOUNTER — Encounter: Payer: Self-pay | Admitting: Family Medicine

## 2012-08-24 ENCOUNTER — Emergency Department (HOSPITAL_COMMUNITY)
Admission: EM | Admit: 2012-08-24 | Discharge: 2012-08-24 | Disposition: A | Discharge status: Home / Self Care | Payer: MEDICARE | Attending: Physician Assistant | Admitting: Physician Assistant

## 2012-08-24 VITALS — BP 136/80 | HR 72 | Resp 18 | Ht 59.0 in | Wt 179.0 lb

## 2012-08-24 DIAGNOSIS — F17200 Nicotine dependence, unspecified, uncomplicated: Secondary | ICD-10-CM | POA: Insufficient documentation

## 2012-08-24 DIAGNOSIS — K5190 Ulcerative colitis, unspecified, without complications: Secondary | ICD-10-CM | POA: Diagnosis not present

## 2012-08-24 DIAGNOSIS — B029 Zoster without complications: Secondary | ICD-10-CM | POA: Diagnosis not present

## 2012-08-24 DIAGNOSIS — E119 Type 2 diabetes mellitus without complications: Secondary | ICD-10-CM | POA: Insufficient documentation

## 2012-08-24 DIAGNOSIS — M79609 Pain in unspecified limb: Secondary | ICD-10-CM

## 2012-08-24 DIAGNOSIS — M5030 Other cervical disc degeneration, unspecified cervical region: Secondary | ICD-10-CM | POA: Diagnosis not present

## 2012-08-24 DIAGNOSIS — M79643 Pain in unspecified hand: Secondary | ICD-10-CM

## 2012-08-24 DIAGNOSIS — Z85819 Personal history of malignant neoplasm of unspecified site of lip, oral cavity, and pharynx: Secondary | ICD-10-CM | POA: Diagnosis not present

## 2012-08-24 DIAGNOSIS — E785 Hyperlipidemia, unspecified: Secondary | ICD-10-CM | POA: Insufficient documentation

## 2012-08-24 MED ORDER — FENTANYL CITRATE 0.05 MG/ML IJ SOLN
50.0000 ug | Freq: Once | INTRAMUSCULAR | Status: AC
  Administered 2012-08-24: 50 ug via INTRAMUSCULAR
  Filled 2012-08-24: qty 2

## 2012-08-24 MED ORDER — ACYCLOVIR 800 MG PO TABS
800.0000 mg | ORAL_TABLET | Freq: Once | ORAL | Status: AC
  Administered 2012-08-24: 800 mg via ORAL
  Filled 2012-08-24: qty 1

## 2012-08-24 MED ORDER — ACYCLOVIR 400 MG PO TABS: 400.0000 mg | ORAL_TABLET | Freq: Three times a day (TID) | ORAL | Status: AC

## 2012-08-24 MED ORDER — HYDROCODONE-ACETAMINOPHEN 7.5-500 MG PO TABS: 1.0000 | ORAL_TABLET | Freq: Four times a day (QID) | ORAL | Status: AC | PRN

## 2012-08-24 MED ORDER — PROMETHAZINE HCL 12.5 MG PO TABS
12.5000 mg | ORAL_TABLET | Freq: Once | ORAL | Status: AC
  Administered 2012-08-24: 12.5 mg via ORAL
  Filled 2012-08-24: qty 1

## 2012-08-24 NOTE — Progress Notes (Signed)
  Subjective:    Patient ID: Adrienne Nelson, female    DOB: [birth year], 61 y.o.   MRN: [id number]  HPI Last week Thursday pt developed acute right arm pain which has priogressively worsened, tried doubling upon pain meds on Sunday, no relief. Two days ago developed painful blisters like fire ,in the palm of her right hand otherwise no sensation in the right hand, no h/o trauma to hand   Review of Systems See HPI Denies recent fever or chills. Denies sinus pressure, nasal congestion, ear pain or sore throat. Denies chest congestion, productive cough or wheezing.  Denies headaches, seizures, numbness, or tingling. Denies depression,has increased  anxiety and  insomnia with onset of current problem.       Objective:   Physical Exam Patient alert and oriented and in no cardiopulmonary distress.Pt in severe pain and anxious  HEENT: No facial asymmetry, EOMI, no sinus tenderness,  oropharynx pink and moist.  Neck decreased ROM, no adenopathy.  Chest: Clear to auscultation bilaterally.  CVS: S1, S2 no murmurs, no S3.  ABD: Soft non tender. Bowel sounds normal.  Ext: No edema  Skin: vesicular erythematous blisters in crops on palm of righthand Psych: Good eye contact, normal affect. Memory intact extremely  anxious and tearful  CNS: CN 2-12 intact,decreased  sensation in right hand        Assessment & Plan:

## 2012-08-24 NOTE — ED Provider Notes (Signed)
Medical screening examination/treatment/procedure(s) were performed by non-physician practitioner and as supervising physician I was immediately available for consultation/collaboration.  Hero Kulish L Messina Kosinski, MD 08/24/12 2103 

## 2012-08-24 NOTE — Patient Instructions (Addendum)
F/u in 3 weeks.  You need to be evaluated in the ED based on severity of symptoms , and I have spoken to the ED doctor  No new medications from this office today

## 2012-08-24 NOTE — ED Provider Notes (Signed)
History     CSN: [id number]  Arrival date & time 08/24/12  1103   None     Chief Complaint  Patient presents with  . Blister  . Hand Pain  . Shoulder Pain    (Consider location/radiation/quality/duration/timing/severity/associated sxs/prior treatment) HPI Comments: Patient is a 61 year old patient of Dr. Lodema Hong who presents to the emergency department at Dr. Anthony Sar request because of shoulder arm and right hand pain. The patient states that this problem started on August 29 when she noted a shooting electric shock type pain accompanied by a burning from the shoe right shoulder to the right hand and in particular the right index and third finger. On Sunday, September 1 the patient developed blisters at the palm of the hand and at the base of the right index finger. The patient also noticed a sore red area on the right shoulder. The patient was seen by her primary care physician and was sent to the emergency department for additional evaluation. The patient also describes an occasional weakness in the grip of the right arm or when lifting certain objects. There's been no recent injury or trauma.   Patient is a 61 y.o. female presenting with hand pain and shoulder pain. The history is provided by the patient. History Limited By: The patient has a problem with hearing particularly in the right year. History was able to be obtained however.  Hand Pain Associated symptoms include abdominal pain, arthralgias and a rash. Pertinent negatives include no chest pain, coughing or neck pain.  Shoulder Pain Associated symptoms include abdominal pain, arthralgias and a rash. Pertinent negatives include no chest pain, coughing or neck pain.    Past Medical History  Diagnosis Date  . Hand pain     bilateral    . Arthritis of knee, right   . Throat cancer   . Depression   . Hearing loss   . Ulcerative colitis   . Hyperlipidemia   . Type 2 diabetes mellitus without complications     Past  Surgical History  Procedure Date  . Cesarean section   . Cesarean section   . Carpal tunnel release   . Cholecystectomy   . Total abdominal hysterectomy   . Left trigger finger surgery Dec. 2011    Family History  Problem Relation Age of Onset  . Lung cancer Mother   . Colon cancer Father     History  Substance Use Topics  . Smoking status: Current Some Day Smoker -- 0.2 packs/day  . Smokeless tobacco: Not on file  . Alcohol Use: No    OB History    Grav Para Term Preterm Abortions TAB SAB Ect Mult Living                  Review of Systems  Constitutional: Negative for activity change.       All ROS Neg except as noted in HPI  HENT: Negative for nosebleeds and neck pain.   Eyes: Negative for photophobia and discharge.  Respiratory: Negative for cough, shortness of breath and wheezing.   Cardiovascular: Negative for chest pain and palpitations.  Gastrointestinal: Positive for abdominal pain. Negative for blood in stool.  Genitourinary: Negative for dysuria, frequency and hematuria.  Musculoskeletal: Positive for arthralgias. Negative for back pain.  Skin: Positive for rash.  Neurological: Negative for dizziness, seizures and speech difficulty.  Psychiatric/Behavioral: Negative for hallucinations and confusion.       Depression    Allergies  Penicillins  Home Medications  Current Outpatient Rx  Name Route Sig Dispense Refill  . ACYCLOVIR 400 MG PO TABS Oral Take 1 tablet (400 mg total) by mouth 3 (three) times daily. 30 tablet 0  . ASPIRIN 81 MG PO TBEC Oral Take 81 mg by mouth daily.      Marland Kitchen CALCIUM CARBONATE-VITAMIN D 600-400 MG-UNIT PO TABS Oral Take 1 tablet by mouth 3 (three) times daily with meals.      Marland Kitchen CLONIDINE HCL 0.2 MG PO TABS Oral Take 1 tablet (0.2 mg total) by mouth at bedtime. 30 tablet 4  . CLONIDINE HCL 0.2 MG PO TABS  TAKE ONE TABLET BY MOUTH AT BEDTIME 30 tablet 2  . FLUOXETINE HCL 20 MG PO CAPS  TAKE TWO CAPSULES BY MOUTH EVERY DAY 60  capsule 2  . FUROSEMIDE 20 MG PO TABS Oral Take 1 tablet (20 mg total) by mouth daily. 30 tablet 3  . HYDROCODONE-ACETAMINOPHEN 7.5-500 MG PO TABS Oral Take 1 tablet by mouth every 6 (six) hours as needed for pain. 30 tablet 0  . HYDROCODONE-ACETAMINOPHEN 10-660 MG PO TABS Oral Take 1 tablet by mouth at bedtime. 30 each 3    Dose reduction effective 07/09/2012. Please fill o ...  . KLOR-CON M20 20 MEQ PO TBCR  TAKE ONE TABLET BY MOUTH TWICE DAILY 60 each 3  . LISINOPRIL 40 MG PO TABS Oral Take 1 tablet (40 mg total) by mouth daily. 30 tablet 4  . MELOXICAM 15 MG PO TABS Oral Take 1 tablet (15 mg total) by mouth daily. 30 tablet 0  . ONE-DAILY MULTI VITAMINS PO TABS Oral Take 1 tablet by mouth daily.      Marland Kitchen PREDNISONE (PAK) 5 MG PO TABS Oral Take 1 tablet (5 mg total) by mouth as directed. Use as directed 21 tablet 0  . PROMETHAZINE HCL 12.5 MG PO TABS Oral Take 2 tablets (25 mg total) by mouth 2 (two) times daily as needed for nausea. for uncontrolled nausea This is a dose increase 120 tablet 1  . SIMVASTATIN 40 MG PO TABS Oral Take 1 tablet (40 mg total) by mouth at bedtime. 30 tablet 4  . TRAZODONE HCL 100 MG PO TABS  TAKE THREE TABLETS BY MOUTH AT BEDTIME 90 tablet 6    BP 123/85  Pulse 77  Temp 98.2 F (36.8 C) (Oral)  Resp 20  Ht 4\' 11"  (1.499 m)  Wt 179 lb (81.194 kg)  BMI 36.15 kg/m2  SpO2 96%  Physical Exam  Nursing note and vitals reviewed. Constitutional: She is oriented to person, place, and time. She appears well-developed and well-nourished.  Non-toxic appearance.  HENT:  Head: Normocephalic.  Right Ear: Tympanic membrane normal.  Left Ear: Tympanic membrane normal.       Patient has difficulty hearing, particularly in the right year.  Eyes: EOM and lids are normal. Pupils are equal, round, and reactive to light.  Neck: Normal range of motion. Neck supple. Carotid bruit is not present.  Cardiovascular: Normal rate, regular rhythm, normal heart sounds, intact distal  pulses and normal pulses.   Pulmonary/Chest: Breath sounds normal. No respiratory distress.  Abdominal: Soft. Bowel sounds are normal. There is no tenderness. There is no guarding.  Musculoskeletal: Normal range of motion.       There is a cluster of red lesions a few of them with blisters at the right shoulder area. Tender to palpation. There are minor scratches and abrasions of the forearm. There is a blister of the palm and  a blister at the base of the right second finger. There is pain to palpation or movement involving the fingers of the right hand. There is good capillary refill present.  Lymphadenopathy:       Head (right side): No submandibular adenopathy present.       Head (left side): No submandibular adenopathy present.    She has no cervical adenopathy.  Neurological: She is alert and oriented to person, place, and time. She has normal strength. No cranial nerve deficit or sensory deficit.  Skin: Skin is warm and dry.  Psychiatric: She has a normal mood and affect. Her speech is normal.    ED Course  Procedures (including critical care time)  Labs Reviewed - No data to display Dg Cervical Spine Complete  08/24/2012  *RADIOLOGY REPORT*  Clinical Data: Hand pain and shoulder pain.  Pain shooting down the right arm.  CERVICAL SPINE - COMPLETE 4+ VIEW  Comparison: No priors.  Findings: No definite acute displaced cervical spine fracture. Alignment is anatomic.  Prevertebral soft tissues are normal. Multilevel degenerative disc disease is noted, most severe at C5-C6 and C6-C7.  IMPRESSION: 1.  No acute radiographic abnormality of the cervical spine. 2.  Multilevel degenerative disc disease and cervical spondylosis, as above.  If there is clinical concern for cervical radiculopathy, this could be better evaluated with MRI of the cervical spine if indicated.   Original Report Authenticated By: Florencia Reasons, M.D.      1. DDD (degenerative disc disease), cervical   2. Shingles        MDM  I have reviewed nursing notes, vital signs, and all appropriate lab and imaging results for this patient. The x-ray of the cervical spine reveals no acute abnormality. The x-ray does show multilevel degenerative disc disease particularly at C5-C6 and C6-C7. It is suspected that the patient has shingles and degenerative disc disease. The patient is treated with acyclovir ear 3 times daily, and Lortab 7.5 every 4-6 hours. The patient is already on a try cyclic medication and this will not be changed at this time. Patient advised to see her primary physician for additional evaluation, and possible MRI of the neck.       Kathie Dike, [place] 08/24/12 1421

## 2012-08-24 NOTE — ED Notes (Signed)
Pt began to experience shoulder pain that radiates down right arm to right hand area on Thursday, pt describes that pain as "shooting electric shocks", pt began to develop blisters to palm of right hand with burning sensation on Sunday am, denies any drainage from blister area. Was seen in Dr. Lodema Hong office who was unsure of diagnosis and sent pt to er for evaluation of ?shingles.

## 2012-08-24 NOTE — ED Notes (Signed)
Pain  To rt shoulder since Thursday,  Began to have blisters pt palm of hand .  Has mult scratches to both forearms that pt says is from a puppy.   Sent from Dr Luther Parody office for evaluation. Alert.

## 2012-08-25 ENCOUNTER — Other Ambulatory Visit: Payer: Self-pay | Admitting: Family Medicine

## 2012-08-25 ENCOUNTER — Telehealth: Payer: Self-pay | Admitting: Family Medicine

## 2012-08-25 DIAGNOSIS — B029 Zoster without complications: Secondary | ICD-10-CM

## 2012-08-25 MED ORDER — LIDOCAINE 5 % EX PTCH: 1.0000 | MEDICATED_PATCH | Freq: Two times a day (BID) | CUTANEOUS | Status: DC

## 2012-08-25 NOTE — Telephone Encounter (Signed)
Advise she has arthritis in her neck, if the pain and numbness and tinglundg in her arm and hand remain as severe next week , call back , I will order an MRI of the neck

## 2012-08-25 NOTE — Telephone Encounter (Signed)
Yes that's the same thingf, Advise her to give medication espescially for the shingles to work, I am prescribing lidoderm patch also

## 2012-08-25 NOTE — Telephone Encounter (Signed)
Wants to know the results of her xray and if she needs to have any additional tests done

## 2012-08-25 NOTE — Telephone Encounter (Signed)
States they told her at the hospital that the test showed that there was something wrong with several of her discs. Wants to know if that's the same thing. Said the Lortabs are not doing any good. She was taking one tab every 6 hours. She was awake all night again lastnight.

## 2012-08-26 NOTE — Telephone Encounter (Signed)
Patient aware.

## 2012-08-26 NOTE — Telephone Encounter (Signed)
Patient aware and med sent in  

## 2012-08-30 ENCOUNTER — Ambulatory Visit (HOSPITAL_COMMUNITY): Payer: MEDICARE

## 2012-09-01 ENCOUNTER — Other Ambulatory Visit: Payer: Self-pay | Admitting: Family Medicine

## 2012-09-04 NOTE — Assessment & Plan Note (Signed)
Acute severe right hand pain with blisters, possible zoster, however on exam pt reports poor sensation in the hand also and pulses appear normal , will refer to Ed based on severity of symptoms, and  Uncertainty of dx, I suspect shingles

## 2012-09-04 NOTE — Assessment & Plan Note (Signed)
.  con1

## 2012-09-06 ENCOUNTER — Telehealth: Payer: Self-pay | Admitting: Family Medicine

## 2012-09-07 ENCOUNTER — Other Ambulatory Visit: Payer: Self-pay

## 2012-09-07 ENCOUNTER — Other Ambulatory Visit: Payer: Self-pay | Admitting: Family Medicine

## 2012-09-07 ENCOUNTER — Telehealth: Payer: Self-pay | Admitting: Family Medicine

## 2012-09-07 MED ORDER — GABAPENTIN 100 MG PO CAPS: 100.0000 mg | ORAL_CAPSULE | Freq: Three times a day (TID) | ORAL | Status: DC

## 2012-09-07 NOTE — Telephone Encounter (Signed)
I have entered gabapentin 100mg  three times daily, this is the drug indicated for nerve pain ferom shingles, explain , then send in please

## 2012-09-07 NOTE — Telephone Encounter (Signed)
See previous message

## 2012-09-07 NOTE — Telephone Encounter (Signed)
Was unable to tolerate the Lidoderm patches and wants to know if her pain tab (hydrocodone 10-660) can be temporarily increased to a couple times daily while she is having the shingles pain. When do you think the pain might receed? It is taking over her life hurting 24/7. All she does is hurt all day long

## 2012-09-07 NOTE — Telephone Encounter (Signed)
Patient aware and info on shingles was sent

## 2012-09-13 ENCOUNTER — Telehealth: Payer: Self-pay | Admitting: Family Medicine

## 2012-09-14 ENCOUNTER — Ambulatory Visit (INDEPENDENT_AMBULATORY_CARE_PROVIDER_SITE_OTHER): Payer: MEDICARE | Admitting: Family Medicine

## 2012-09-14 ENCOUNTER — Encounter: Payer: Self-pay | Admitting: Family Medicine

## 2012-09-14 VITALS — BP 120/90 | HR 77 | Resp 16 | Ht 59.0 in | Wt 179.0 lb

## 2012-09-14 DIAGNOSIS — R7309 Other abnormal glucose: Secondary | ICD-10-CM | POA: Diagnosis not present

## 2012-09-14 DIAGNOSIS — I10 Essential (primary) hypertension: Secondary | ICD-10-CM

## 2012-09-14 DIAGNOSIS — B029 Zoster without complications: Secondary | ICD-10-CM

## 2012-09-14 DIAGNOSIS — E785 Hyperlipidemia, unspecified: Secondary | ICD-10-CM

## 2012-09-14 DIAGNOSIS — R7303 Prediabetes: Secondary | ICD-10-CM

## 2012-09-14 DIAGNOSIS — Z23 Encounter for immunization: Secondary | ICD-10-CM | POA: Diagnosis not present

## 2012-09-14 DIAGNOSIS — B0223 Postherpetic polyneuropathy: Secondary | ICD-10-CM | POA: Diagnosis not present

## 2012-09-14 DIAGNOSIS — F329 Major depressive disorder, single episode, unspecified: Secondary | ICD-10-CM

## 2012-09-14 DIAGNOSIS — F3289 Other specified depressive episodes: Secondary | ICD-10-CM

## 2012-09-14 MED ORDER — POTASSIUM CHLORIDE CRYS ER 20 MEQ PO TBCR: EXTENDED_RELEASE_TABLET | ORAL | Status: DC

## 2012-09-14 MED ORDER — PREDNISONE (PAK) 5 MG PO TABS: 5.0000 mg | ORAL_TABLET | ORAL | Status: DC

## 2012-09-14 MED ORDER — CLONIDINE HCL 0.2 MG PO TABS: ORAL_TABLET | ORAL | Status: DC

## 2012-09-14 MED ORDER — GABAPENTIN 100 MG PO CAPS: ORAL_CAPSULE | ORAL | Status: DC

## 2012-09-14 NOTE — Patient Instructions (Addendum)
Annual wellness early December.  I am glad that you are doing some better and look forward to this getting even better  Dose increase in gabapentin to TWO at bedtime, continue one twice daily, total 4 per day  Prednisone dose pack sent to the pharmacy for pain.Take the prednisone on October 3, not before please   Flu vaccine today

## 2012-09-14 NOTE — Progress Notes (Signed)
  Subjective:    Patient ID: Adrienne Nelson, female    DOB: 06/23/1951, 61 y.o.   MRN: [id number]  HPI The PT is here for follow up and re-evaluation of chronic medical conditions, medication management and review of any available recent lab and radiology data.The follow up is particularly to review how she is coping with zoster infection. States pain is between 30 to 40 % improved, still unbearable. Unfortunately, she was unable to tolerate lido derm patch  Preventive health is updated, specifically  Cancer screening and Immunization.   Questions or concerns regarding consultations or procedures which the PT has had in the interim are  addressed.  There are no new concerns.        Review of Systems See HPI Denies recent fever or chills. Denies sinus pressure, nasal congestion, ear pain or sore throat. Denies chest congestion, productive cough or wheezing. Denies chest pains, palpitations and leg swelling Denies abdominal pain, nausea, vomiting,diarrhea or constipation.   Denies dysuria, frequency, hesitancy or incontinence. Denies joint pain, swelling and limitation in mobility. Denies headaches, seizures, numbness, . Denies depression, anxiety or insomnia. .        Objective:   Physical Exam  Patient alert and oriented and in no cardiopulmonary distress.Pt in severe pain  HEENT: No facial asymmetry, EOMI, no sinus tenderness,  oropharynx pink and moist.  Neck supple no adenopathy.  Chest: Clear to auscultation bilaterally.  CVS: S1, S2 no murmurs, no S3.  ABD: Soft non tender. Bowel sounds normal.  Ext: No edema  MS: Adequate ROM spine, shoulders, hips and knees.  Skin: Healed lesions on right upper ext where pt had recent acute zoster outbreak Psych: Good eye contact, normal affect. Memory intact  anxious but not depressed appearing.  CNS: CN 2-12 intact, power, tone and sensation normal throughout.       Assessment & Plan:

## 2012-09-14 NOTE — Telephone Encounter (Signed)
Has OV for today

## 2012-09-19 ENCOUNTER — Other Ambulatory Visit: Payer: Self-pay | Admitting: Family Medicine

## 2012-09-26 DIAGNOSIS — F329 Major depressive disorder, single episode, unspecified: Secondary | ICD-10-CM | POA: Insufficient documentation

## 2012-09-26 DIAGNOSIS — B0223 Postherpetic polyneuropathy: Secondary | ICD-10-CM | POA: Insufficient documentation

## 2012-09-26 NOTE — Assessment & Plan Note (Signed)
Controlled with diet only, at one time pt was established diabetic on medication, but lifestyle change and weight loss has cured her of the  disease

## 2012-09-26 NOTE — Assessment & Plan Note (Signed)
Controlled, no change in medication Hyperlipidemia:Low fat diet discussed and encouraged.  \ 

## 2012-09-26 NOTE — Assessment & Plan Note (Addendum)
Continues to experience severe neuropathic pain in right upper extremity, although she has no current rash Was intolerant of of lidoderm patch. States her pain is about 40% reduced but still experiences severe upper extremity burning  Will increase gabapentin dose as tolerated also prednisone dose pack to be started 09/23/2012

## 2012-09-26 NOTE — Assessment & Plan Note (Signed)
Controlled, no change in medication  

## 2012-09-26 NOTE — Assessment & Plan Note (Addendum)
UnControlled, no change in medication, diastolic pressure slightly elevated at this visit DASH diet and commitment to daily physical activity for a minimum of 30 minutes discussed and encouraged, as a part of hypertension management. The importance of attaining a healthy weight is also discussed.

## 2012-09-27 ENCOUNTER — Other Ambulatory Visit: Payer: Self-pay | Admitting: Family Medicine

## 2012-09-27 ENCOUNTER — Telehealth: Payer: Self-pay

## 2012-09-27 ENCOUNTER — Other Ambulatory Visit: Payer: Self-pay

## 2012-09-27 MED ORDER — GABAPENTIN 300 MG PO CAPS: 300.0000 mg | ORAL_CAPSULE | Freq: Three times a day (TID) | ORAL | Status: DC

## 2012-09-27 NOTE — Telephone Encounter (Signed)
Patient aware.

## 2012-09-27 NOTE — Telephone Encounter (Signed)
Still having the severe pain in her arm from the shingles. States the increased gabapentin has done nothing. She is in severe pain constantly. States she can't eat and can't sleep or even leave her home. States this pain is consuming her life and there has to be something done about it. Said to please call her back  [phone]

## 2012-09-27 NOTE — Telephone Encounter (Signed)
pls advise increase to 100mg  3 tabs three times daily, I have entered the new dose as 300mg  one three times daily, sorry she is having this much problem  Start at 300mg  twice daily then three times daily after 4 days

## 2012-10-06 ENCOUNTER — Telehealth: Payer: Self-pay | Admitting: Family Medicine

## 2012-10-06 DIAGNOSIS — M79643 Pain in unspecified hand: Secondary | ICD-10-CM

## 2012-10-11 NOTE — Telephone Encounter (Signed)
She can increase to 600mg  at bedtime and take 300mg , morning and afternoon She can also increase her pain med to twice a day if needed

## 2012-10-11 NOTE — Telephone Encounter (Signed)
Has been taking gabapentin 300mg  tid and its not working for her shingles pain. Wants to know if she is at the max dose, if it would be ok to D/C it since its not doing her pain any good?

## 2012-10-12 MED ORDER — HYDROCODONE-ACETAMINOPHEN 10-660 MG PO TABS: ORAL_TABLET | ORAL | Status: DC

## 2012-10-12 MED ORDER — GABAPENTIN 300 MG PO CAPS: ORAL_CAPSULE | ORAL | Status: DC

## 2012-10-12 NOTE — Telephone Encounter (Signed)
Pt aware.

## 2012-10-12 NOTE — Telephone Encounter (Signed)
Called patient and left message for them to return call at the office   

## 2012-10-20 DIAGNOSIS — R109 Unspecified abdominal pain: Secondary | ICD-10-CM | POA: Diagnosis not present

## 2012-10-20 DIAGNOSIS — G8929 Other chronic pain: Secondary | ICD-10-CM | POA: Diagnosis not present

## 2012-10-20 DIAGNOSIS — F17200 Nicotine dependence, unspecified, uncomplicated: Secondary | ICD-10-CM | POA: Diagnosis not present

## 2012-10-20 DIAGNOSIS — E119 Type 2 diabetes mellitus without complications: Secondary | ICD-10-CM | POA: Diagnosis not present

## 2012-10-20 DIAGNOSIS — Z7982 Long term (current) use of aspirin: Secondary | ICD-10-CM | POA: Diagnosis not present

## 2012-10-20 DIAGNOSIS — M549 Dorsalgia, unspecified: Secondary | ICD-10-CM | POA: Diagnosis not present

## 2012-10-20 DIAGNOSIS — Z87442 Personal history of urinary calculi: Secondary | ICD-10-CM | POA: Diagnosis not present

## 2012-10-20 DIAGNOSIS — Z79899 Other long term (current) drug therapy: Secondary | ICD-10-CM | POA: Diagnosis not present

## 2012-10-20 DIAGNOSIS — I10 Essential (primary) hypertension: Secondary | ICD-10-CM | POA: Diagnosis not present

## 2012-11-09 ENCOUNTER — Encounter: Payer: Self-pay | Admitting: Family Medicine

## 2012-11-09 ENCOUNTER — Ambulatory Visit (INDEPENDENT_AMBULATORY_CARE_PROVIDER_SITE_OTHER): Payer: MEDICARE | Admitting: Family Medicine

## 2012-11-09 VITALS — BP 126/72 | HR 73 | Resp 18 | Ht 59.0 in | Wt 177.1 lb

## 2012-11-09 DIAGNOSIS — M79609 Pain in unspecified limb: Secondary | ICD-10-CM

## 2012-11-09 DIAGNOSIS — N399 Disorder of urinary system, unspecified: Secondary | ICD-10-CM

## 2012-11-09 DIAGNOSIS — N3000 Acute cystitis without hematuria: Secondary | ICD-10-CM | POA: Diagnosis not present

## 2012-11-09 DIAGNOSIS — B0229 Other postherpetic nervous system involvement: Secondary | ICD-10-CM

## 2012-11-09 DIAGNOSIS — M79643 Pain in unspecified hand: Secondary | ICD-10-CM

## 2012-11-09 DIAGNOSIS — R3989 Other symptoms and signs involving the genitourinary system: Secondary | ICD-10-CM

## 2012-11-09 LAB — POCT URINALYSIS DIPSTICK
Glucose, UA: NEGATIVE
Nitrite, UA: NEGATIVE
Protein, UA: NEGATIVE
Urobilinogen, UA: 0.2
pH, UA: 5.5

## 2012-11-09 MED ORDER — NAPROXEN 375 MG PO TABS: 375.0000 mg | ORAL_TABLET | Freq: Two times a day (BID) | ORAL | Status: DC

## 2012-11-09 MED ORDER — AMITRIPTYLINE HCL 50 MG PO TABS: 50.0000 mg | ORAL_TABLET | Freq: Every day | ORAL | Status: DC

## 2012-11-09 NOTE — Patient Instructions (Addendum)
Decrease gabapentin to 300mg  three times a day, x 1 week Then 300mg  twice a day x 1 week Then 300mg  at bedtime x 1 week, then stop Start amitryptiline take 1/2 tablet at bedtime for 1 week, then increase to 1 tablet at bedtime Stop Meloxicam Start naprosyn twice a day  Call in 2 weeks to see how your symptoms are doing F/U as previous with Dr Lodema Hong

## 2012-11-10 ENCOUNTER — Encounter: Payer: MEDICARE | Admitting: Family Medicine

## 2012-11-10 ENCOUNTER — Encounter: Payer: Self-pay | Admitting: Family Medicine

## 2012-11-10 DIAGNOSIS — R3989 Other symptoms and signs involving the genitourinary system: Secondary | ICD-10-CM | POA: Insufficient documentation

## 2012-11-10 DIAGNOSIS — B0229 Other postherpetic nervous system involvement: Secondary | ICD-10-CM | POA: Insufficient documentation

## 2012-11-10 NOTE — Assessment & Plan Note (Signed)
Decreased ROM due to pain, no swelling seen, change NSAID

## 2012-11-10 NOTE — Assessment & Plan Note (Addendum)
Multiple treatments, will titrate off neurontin, start elavil. COntinue narcotic pain meds She may need neurology referral if we can not get better pain control Change NSAID to naprosyn BID for possible inflammation at wrist and hand

## 2012-11-10 NOTE — Assessment & Plan Note (Addendum)
?   Related to neurontin, has this medication has noted helped and pt has new complaint with it, would prefer to titrate her off the medication. UA negative for infection,acute symptoms, will see if they resolve No new back pain or radicular symptoms

## 2012-11-10 NOTE — Progress Notes (Signed)
  Subjective:    Patient ID: Adrienne Nelson, female    DOB: 1951-12-22, 61 y.o.   MRN: [id number]  HPI  Pt presents with hand pain, treated for shingles of RUE and hand in September, has had significant neuralgia since then, on chronic pain meds, as well as NSAIDS,. Gabapentin has been titrated up but she can not tell if it is helping, given, lidoderm patches, prednisone burst as well with no effects. She thinks gabapentin is causing urinary problems, she denies dysuria, abd pain, but leaks at times and can not make it to the rest room, these symptoms started with the medication. Feels she empties bladder well. Does not occur with cough,sneeze She also had swelling over her right wrist causing it to be difficult to move   Review of Systems - per above  GEN- denies fatigue, fever, weight loss,weakness, recent illness HEENT- denies eye drainage, change in vision, nasal discharge, CVS- denies chest pain, palpitations RESP- denies SOB, cough, wheeze ABD- denies N/V, change in stools, abd pain GU- denies dysuria, hematuria, dribbling,+ incontinence, + foul smelling urine MSK- denies joint pain, muscle aches, injury Neuro- denies headache, dizziness, syncope, seizure activity      Objective:   Physical Exam GEN- NAD, alert and oriented x3 CVS- RRR, no murmur RESP-CTAB ABD-NABS,soft,NT,ND, no CVA tenderness EXT- No edema Pulses- Radial+ Skin- no rash, hypersensitivty in right arm Neuro- CNII-XII in tact,normal tone MSK- decreased grasp right hand secondary to pain, no swelling over wrist, decreased ROM hand       Assessment & Plan:

## 2012-11-20 ENCOUNTER — Other Ambulatory Visit: Payer: Self-pay | Admitting: Family Medicine

## 2012-12-07 DIAGNOSIS — M76899 Other specified enthesopathies of unspecified lower limb, excluding foot: Secondary | ICD-10-CM | POA: Diagnosis not present

## 2012-12-13 ENCOUNTER — Encounter: Payer: Self-pay | Admitting: Family Medicine

## 2012-12-13 ENCOUNTER — Ambulatory Visit (INDEPENDENT_AMBULATORY_CARE_PROVIDER_SITE_OTHER): Payer: MEDICARE | Admitting: Family Medicine

## 2012-12-13 VITALS — BP 132/86 | HR 77 | Temp 98.3°F | Resp 18 | Ht 59.0 in | Wt 175.0 lb

## 2012-12-13 DIAGNOSIS — F329 Major depressive disorder, single episode, unspecified: Secondary | ICD-10-CM

## 2012-12-13 DIAGNOSIS — M79643 Pain in unspecified hand: Secondary | ICD-10-CM

## 2012-12-13 DIAGNOSIS — R7309 Other abnormal glucose: Secondary | ICD-10-CM

## 2012-12-13 DIAGNOSIS — B0229 Other postherpetic nervous system involvement: Secondary | ICD-10-CM

## 2012-12-13 DIAGNOSIS — M79641 Pain in right hand: Secondary | ICD-10-CM | POA: Insufficient documentation

## 2012-12-13 DIAGNOSIS — M79609 Pain in unspecified limb: Secondary | ICD-10-CM | POA: Diagnosis not present

## 2012-12-13 DIAGNOSIS — F3289 Other specified depressive episodes: Secondary | ICD-10-CM

## 2012-12-13 DIAGNOSIS — J209 Acute bronchitis, unspecified: Secondary | ICD-10-CM

## 2012-12-13 DIAGNOSIS — R7303 Prediabetes: Secondary | ICD-10-CM

## 2012-12-13 DIAGNOSIS — M6281 Muscle weakness (generalized): Secondary | ICD-10-CM | POA: Diagnosis not present

## 2012-12-13 DIAGNOSIS — I10 Essential (primary) hypertension: Secondary | ICD-10-CM

## 2012-12-13 DIAGNOSIS — E669 Obesity, unspecified: Secondary | ICD-10-CM

## 2012-12-13 DIAGNOSIS — E785 Hyperlipidemia, unspecified: Secondary | ICD-10-CM

## 2012-12-13 DIAGNOSIS — E049 Nontoxic goiter, unspecified: Secondary | ICD-10-CM | POA: Diagnosis not present

## 2012-12-13 DIAGNOSIS — J019 Acute sinusitis, unspecified: Secondary | ICD-10-CM

## 2012-12-13 DIAGNOSIS — R29898 Other symptoms and signs involving the musculoskeletal system: Secondary | ICD-10-CM

## 2012-12-13 MED ORDER — PREDNISONE (PAK) 5 MG PO TABS: 5.0000 mg | ORAL_TABLET | ORAL | Status: DC

## 2012-12-13 MED ORDER — ALBUTEROL SULFATE (5 MG/ML) 0.5% IN NEBU
2.5000 mg | INHALATION_SOLUTION | Freq: Once | RESPIRATORY_TRACT | Status: AC
  Administered 2012-12-13: 2.5 mg via RESPIRATORY_TRACT

## 2012-12-13 MED ORDER — IPRATROPIUM BROMIDE 0.02 % IN SOLN
0.5000 mg | Freq: Once | RESPIRATORY_TRACT | Status: AC
  Administered 2012-12-13: 0.5 mg via RESPIRATORY_TRACT

## 2012-12-13 MED ORDER — SULFAMETHOXAZOLE-TRIMETHOPRIM 800-160 MG PO TABS: 1.0000 | ORAL_TABLET | Freq: Two times a day (BID) | ORAL | Status: DC

## 2012-12-13 MED ORDER — METHYLPREDNISOLONE ACETATE 80 MG/ML IJ SUSP
80.0000 mg | Freq: Once | INTRAMUSCULAR | Status: AC
  Administered 2012-12-13: 80 mg via INTRAMUSCULAR

## 2012-12-13 MED ORDER — HYDROCODONE-ACETAMINOPHEN 10-325 MG PO TABS: ORAL_TABLET | ORAL | Status: DC

## 2012-12-13 MED ORDER — BENZONATATE 100 MG PO CAPS: 100.0000 mg | ORAL_CAPSULE | Freq: Four times a day (QID) | ORAL | Status: DC | PRN

## 2012-12-13 NOTE — Progress Notes (Signed)
  Subjective:    Patient ID: RAFAELLA KOLE, female    DOB: 09-14-1951, 61 y.o.   MRN: [id number]  HPI  The PT is here for follow up and re-evaluation of chronic medical conditions, medication management and review of any available recent lab and radiology data.  Preventive health is updated, specifically  Cancer screening and Immunization.   Questions or concerns regarding consultations or procedures which the PT has had in the interim are  Addressed.Had ortho Dr Deanne Coffer inject both hips last week with great result The PT denies any adverse reactions to current medications since the last visit. States naprosyn is not helping her pain Right hand pain, burning and weakness persist, needs neuro eval 1 week h/o head and chest congestion, yellow green nasal drainage , blood tinged and cough productive of yellow sputum, also c/o left ear pain    Review of Systems See HPI . Denies chest pains, palpitations and leg swelling Denies abdominal pain, nausea, vomiting,diarrhea or constipation.   Denies dysuria, frequency, hesitancy or incontinence.  Denies headaches, seizures, . Denies depression, anxiety or insomnia. Denies skin break down or rash.        Objective:   Physical Exam Patient alert and oriented and in no cardiopulmonary distress.Pt in pain  HEENT: No facial asymmetry, EOMI, maxillary sinus tenderness,  oropharynx pink and moist.  Neck supple left anterior cervical adenopathy.TM clear  Chest: decreased air entry, bilateral crackles and wheezes  CVS: S1, S2 no murmurs, no S3.  ABD: Soft non tender. Bowel sounds normal.  Ext: No edema  MS: Adequate though reduced  ROM spine, shoulders, hips and knees.  Skin: Intact, no ulcerations or rash noted.  Psych: Good eye contact, normal affect. Memory intact not anxious or depressed appearing.  CNS: CN 2-12 intact, grade 3 power in right hand       Assessment & Plan:

## 2012-12-13 NOTE — Patient Instructions (Addendum)
F/u in 3 month  You are being treated for acute sinusitis and bronchitis. Neb treatment in office and depo medrol 80mg  IM. Antibiotics and prednisone dose pack are sent to your pharmacy also  I am referring you to Dr Gerilyn Pilgrim for evauation of right hand due to ongoing pain and new weakness following the shingles you had earlier this year. Pain script is changed to one daily, a lower dose of tylenol per new guidelines. I suggest you discontinue naproxen since this is not relieving your pain symptoms  CBc, chem 7 , hBA1C andTSH today

## 2012-12-14 LAB — CBC
MCH: 30.6 pg (ref 26.0–34.0)
MCHC: 35.4 g/dL (ref 30.0–36.0)
Platelets: 298 10*3/uL (ref 150–400)
RBC: 5.29 MIL/uL — ABNORMAL HIGH (ref 3.87–5.11)
RDW: 13.8 % (ref 11.5–15.5)

## 2012-12-14 LAB — HEMOGLOBIN A1C: Hgb A1c MFr Bld: 5.7 % — ABNORMAL HIGH (ref <5.7)

## 2012-12-14 LAB — BASIC METABOLIC PANEL
BUN: 17 mg/dL (ref 6–23)
Calcium: 10.1 mg/dL (ref 8.4–10.5)
Creat: 0.81 mg/dL (ref 0.50–1.10)
Glucose, Bld: 102 mg/dL — ABNORMAL HIGH (ref 70–99)

## 2012-12-16 ENCOUNTER — Other Ambulatory Visit: Payer: Self-pay

## 2012-12-16 DIAGNOSIS — M79643 Pain in unspecified hand: Secondary | ICD-10-CM

## 2012-12-16 MED ORDER — HYDROCODONE-ACETAMINOPHEN 10-325 MG PO TABS: ORAL_TABLET | ORAL | Status: DC

## 2012-12-26 ENCOUNTER — Other Ambulatory Visit: Payer: Self-pay | Admitting: Family Medicine

## 2012-12-30 DIAGNOSIS — G609 Hereditary and idiopathic neuropathy, unspecified: Secondary | ICD-10-CM | POA: Diagnosis not present

## 2012-12-30 DIAGNOSIS — G252 Other specified forms of tremor: Secondary | ICD-10-CM | POA: Diagnosis not present

## 2012-12-30 DIAGNOSIS — G250 Essential tremor: Secondary | ICD-10-CM | POA: Diagnosis not present

## 2012-12-30 DIAGNOSIS — G5600 Carpal tunnel syndrome, unspecified upper limb: Secondary | ICD-10-CM | POA: Diagnosis not present

## 2013-01-02 DIAGNOSIS — J019 Acute sinusitis, unspecified: Secondary | ICD-10-CM | POA: Insufficient documentation

## 2013-01-02 NOTE — Assessment & Plan Note (Signed)
Controlled, no change in medication DASH diet and commitment to daily physical activity for a minimum of 30 minutes discussed and encouraged, as a part of hypertension management. The importance of attaining a healthy weight is also discussed.  

## 2013-01-02 NOTE — Assessment & Plan Note (Signed)
Neb treatment, antibiotics an decongestants

## 2013-01-02 NOTE — Assessment & Plan Note (Signed)
ongpoing uncontrolled pain with right hand weakness, refer to neurology

## 2013-01-02 NOTE — Assessment & Plan Note (Signed)
Hyperlipidemia:Low fat diet discussed and encouraged.  Controlled, no change in medication   

## 2013-01-02 NOTE — Assessment & Plan Note (Signed)
Improved. Pt applauded on succesful weight loss through lifestyle change, and encouraged to continue same. Weight loss goal set for the next several months.  

## 2013-01-02 NOTE — Assessment & Plan Note (Signed)
Controlled, no change in medication  

## 2013-01-02 NOTE — Assessment & Plan Note (Signed)
Antibiotic prescribed 

## 2013-01-05 ENCOUNTER — Other Ambulatory Visit: Payer: Self-pay | Admitting: Family Medicine

## 2013-01-17 ENCOUNTER — Telehealth: Payer: Self-pay | Admitting: Family Medicine

## 2013-01-17 NOTE — Telephone Encounter (Signed)
pls write a letter excusing pt from jury duty due to medical conditions. I will sign Pls let her know when completed

## 2013-01-19 NOTE — Telephone Encounter (Signed)
Letter written to be signed

## 2013-02-09 DIAGNOSIS — G609 Hereditary and idiopathic neuropathy, unspecified: Secondary | ICD-10-CM | POA: Diagnosis not present

## 2013-02-17 ENCOUNTER — Other Ambulatory Visit: Payer: Self-pay | Admitting: Family Medicine

## 2013-02-23 ENCOUNTER — Other Ambulatory Visit: Payer: Self-pay | Admitting: Family Medicine

## 2013-03-15 ENCOUNTER — Encounter: Payer: Self-pay | Admitting: Family Medicine

## 2013-03-15 ENCOUNTER — Ambulatory Visit (INDEPENDENT_AMBULATORY_CARE_PROVIDER_SITE_OTHER): Payer: MEDICARE | Admitting: Family Medicine

## 2013-03-15 VITALS — BP 160/86 | HR 84 | Resp 16 | Wt 177.8 lb

## 2013-03-15 DIAGNOSIS — M79609 Pain in unspecified limb: Secondary | ICD-10-CM

## 2013-03-15 DIAGNOSIS — E785 Hyperlipidemia, unspecified: Secondary | ICD-10-CM | POA: Diagnosis not present

## 2013-03-15 DIAGNOSIS — B0223 Postherpetic polyneuropathy: Secondary | ICD-10-CM | POA: Diagnosis not present

## 2013-03-15 DIAGNOSIS — R7309 Other abnormal glucose: Secondary | ICD-10-CM

## 2013-03-15 DIAGNOSIS — B0229 Other postherpetic nervous system involvement: Secondary | ICD-10-CM

## 2013-03-15 DIAGNOSIS — F17200 Nicotine dependence, unspecified, uncomplicated: Secondary | ICD-10-CM

## 2013-03-15 DIAGNOSIS — E669 Obesity, unspecified: Secondary | ICD-10-CM

## 2013-03-15 DIAGNOSIS — I10 Essential (primary) hypertension: Secondary | ICD-10-CM

## 2013-03-15 DIAGNOSIS — G5600 Carpal tunnel syndrome, unspecified upper limb: Secondary | ICD-10-CM | POA: Diagnosis not present

## 2013-03-15 DIAGNOSIS — M79641 Pain in right hand: Secondary | ICD-10-CM

## 2013-03-15 DIAGNOSIS — R7303 Prediabetes: Secondary | ICD-10-CM

## 2013-03-15 LAB — COMPLETE METABOLIC PANEL WITH GFR
ALT: 14 U/L (ref 0–35)
AST: 17 U/L (ref 0–37)
CO2: 29 mEq/L (ref 19–32)
Calcium: 9.5 mg/dL (ref 8.4–10.5)
Chloride: 106 mEq/L (ref 96–112)
GFR, Est African American: >89 mL/min
Sodium: 141 mEq/L (ref 135–145)
Total Protein: 5.9 g/dL — ABNORMAL LOW (ref 6.0–8.3)

## 2013-03-15 LAB — HEMOGLOBIN A1C: Mean Plasma Glucose: 126 mg/dL — ABNORMAL HIGH (ref <117)

## 2013-03-15 LAB — LIPID PANEL: Cholesterol: 169 mg/dL (ref 0–200)

## 2013-03-15 MED ORDER — PREGABALIN 25 MG PO CAPS: 25.0000 mg | ORAL_CAPSULE | Freq: Three times a day (TID) | ORAL | Status: DC

## 2013-03-15 MED ORDER — KETOROLAC TROMETHAMINE 60 MG/2ML IJ SOLN
60.0000 mg | Freq: Once | INTRAMUSCULAR | Status: AC
  Administered 2013-03-15: 60 mg via INTRAMUSCULAR

## 2013-03-15 MED ORDER — BUPROPION HCL ER (SR) 150 MG PO TB12: 150.0000 mg | ORAL_TABLET | Freq: Two times a day (BID) | ORAL | Status: DC

## 2013-03-15 NOTE — Patient Instructions (Addendum)
F/u in 3.5 month  Congrats on decision to quit smoking, you need to . Start one tablet once daily for 3 days of wellbutrin , then on day 4 , increase to twice daily. You will get additional materia to help with smoking cessation, this decision will help to improve the quality of your life and to extend it.  Labs today, lipid, cmp and EGFr, HBa1C  You are referred to hand surgeon for opinion re value of carpal tunnel and I will also speak with Dr Gerilyn Pilgrim  New med lyrica prescribed for hand pain

## 2013-03-17 ENCOUNTER — Telehealth: Payer: Self-pay | Admitting: Family Medicine

## 2013-03-17 MED ORDER — GABAPENTIN 600 MG PO TABS: 600.0000 mg | ORAL_TABLET | Freq: Three times a day (TID) | ORAL | Status: DC

## 2013-03-17 NOTE — Telephone Encounter (Signed)
She would like to try the gabapentin at a higher dose

## 2013-03-17 NOTE — Telephone Encounter (Signed)
Can anything else be given?

## 2013-03-17 NOTE — Telephone Encounter (Signed)
Only alternative is the gabapentin which she feels does nothing for her, if she wants it back at a higher dose , slightly , than before, pls let me know

## 2013-03-17 NOTE — Telephone Encounter (Signed)
Higher dose printed, please fax and let pt and pharmacist know

## 2013-03-18 NOTE — Telephone Encounter (Signed)
Patient aware. Faxed

## 2013-03-22 DIAGNOSIS — M1710 Unilateral primary osteoarthritis, unspecified knee: Secondary | ICD-10-CM | POA: Diagnosis not present

## 2013-03-24 ENCOUNTER — Other Ambulatory Visit: Payer: Self-pay | Admitting: Family Medicine

## 2013-04-03 ENCOUNTER — Other Ambulatory Visit: Payer: Self-pay | Admitting: Family Medicine

## 2013-04-03 NOTE — Assessment & Plan Note (Addendum)
UnControlled, no change in medication, pt in significant pain, generally BP is controlled, will review next visit DASH diet and commitment to daily physical activity for a minimum of 30 minutes discussed and encouraged, as a part of hypertension management. The importance of attaining a healthy weight is also discussed.

## 2013-04-03 NOTE — Progress Notes (Signed)
  Subjective:    Patient ID: Adrienne Nelson, female    DOB: 1951-10-15, 62 y.o.   MRN: [id number]  HPI The PT is here for follow up and re-evaluation of chronic medical conditions, medication management and review of any available recent lab and radiology data.  Preventive health is updated, specifically  Cancer screening and Immunization.   Questions or concerns regarding consultations or procedures which the PT has had in the interim are  addressed. The PT denies any adverse reactions to current medications since the last visit.  Ongoing debilitating right hand pain with poor function, no real relief from gabapentin requests ortho consultant, and needs one. Depressed and stressed over this, however not suicidal or homicidal      Review of Systems See HPI Denies recent fever or chills. Denies sinus pressure, nasal congestion, ear pain or sore throat. Denies chest congestion, productive cough or wheezing. Denies chest pains, palpitations and leg swelling Denies abdominal pain, nausea, vomiting,diarrhea or constipation.   Denies dysuria, frequency, hesitancy or incontinence. Chronic  joint pain, swelling and limitation in mobility. Denies headaches, seizures,   Denies skin break down or rash.        Objective:   Physical Exam Patient alert and oriented and in no cardiopulmonary distress.Pt in pain  HEENT: No facial asymmetry, EOMI, no sinus tenderness,  oropharynx pink and moist.  Neck supple no adenopathy.  Chest: Clear to auscultation bilaterally.  CVS: S1, S2 no murmurs, no S3.  ABD: Soft non tender. Bowel sounds normal.  Ext: No edema  MS: Adequate though reduced  ROM spine, shoulders, hips and knees.reduced power in right hand  Skin: Intact, no ulcerations or rash noted.  Psych: Good eye contact, normal affect. Memory intact mildlyt anxious not depressed appearing.  CNS: CN 2-12 intact,         Assessment & Plan:

## 2013-04-03 NOTE — Assessment & Plan Note (Signed)
Deteriorated. Patient re-educated about  the importance of commitment to a  minimum of 150 minutes of exercise per week. The importance of healthy food choices with portion control discussed. Encouraged to start a food diary, count calories and to consider  joining a support group. Sample diet sheets offered. Goals set by the patient for the next several months.    

## 2013-04-03 NOTE — Assessment & Plan Note (Signed)
Uncontrolled, debilitating hand pain following zoster with underlying pathology, will refer to hand surgeon for 2nd opinioin due to ongoing debili, with weakness and lack of function. Trial of lyrica since not responding to gabapentin

## 2013-04-03 NOTE — Assessment & Plan Note (Signed)
Ready toquit Patient counseled for approximately 5 minutes regarding the health risks of ongoing nicotine use, specifically all types of cancer, heart disease, stroke and respiratory failure. The options available for help with cessation ,the behavioral changes to assist the process, and the option to either gradully reduce usage  Or abruptly stop.is also discussed. Pt is also encouraged to set specific goals in number of cigarettes used daily, as well as to set a quit date. Will start zyban

## 2013-04-03 NOTE — Assessment & Plan Note (Signed)
Hyperlipidemia:Low fat diet discussed and encouraged.  Controlled, no change in medication   

## 2013-04-12 DIAGNOSIS — M1710 Unilateral primary osteoarthritis, unspecified knee: Secondary | ICD-10-CM | POA: Diagnosis not present

## 2013-04-12 DIAGNOSIS — M76899 Other specified enthesopathies of unspecified lower limb, excluding foot: Secondary | ICD-10-CM | POA: Diagnosis not present

## 2013-04-18 ENCOUNTER — Ambulatory Visit (HOSPITAL_COMMUNITY)
Admission: RE | Admit: 2013-04-18 | Discharge: 2013-04-18 | Disposition: A | Discharge status: Home / Self Care | Payer: MEDICARE | Source: Ambulatory Visit | Attending: Family Medicine | Admitting: Family Medicine

## 2013-04-18 ENCOUNTER — Ambulatory Visit (INDEPENDENT_AMBULATORY_CARE_PROVIDER_SITE_OTHER): Payer: MEDICARE | Admitting: Family Medicine

## 2013-04-18 VITALS — BP 104/74 | HR 78 | Resp 15 | Wt 184.1 lb

## 2013-04-18 DIAGNOSIS — R079 Chest pain, unspecified: Secondary | ICD-10-CM | POA: Diagnosis not present

## 2013-04-18 DIAGNOSIS — E119 Type 2 diabetes mellitus without complications: Secondary | ICD-10-CM | POA: Insufficient documentation

## 2013-04-18 DIAGNOSIS — R109 Unspecified abdominal pain: Secondary | ICD-10-CM | POA: Diagnosis not present

## 2013-04-18 DIAGNOSIS — N390 Urinary tract infection, site not specified: Secondary | ICD-10-CM

## 2013-04-18 DIAGNOSIS — F17200 Nicotine dependence, unspecified, uncomplicated: Secondary | ICD-10-CM | POA: Diagnosis not present

## 2013-04-18 DIAGNOSIS — J984 Other disorders of lung: Secondary | ICD-10-CM | POA: Diagnosis not present

## 2013-04-18 MED ORDER — METHOCARBAMOL 500 MG PO TABS: 500.0000 mg | ORAL_TABLET | Freq: Three times a day (TID) | ORAL | Status: DC | PRN

## 2013-04-18 NOTE — Progress Notes (Signed)
  Subjective:    Patient ID: Adrienne Nelson, female    DOB: 11-19-1951, 62 y.o.   MRN: [id number]  HPI Patient here with left side pain mostly over her ribs for the past month she does remember the dog jumping up on her side otherwise no other particular injury that she states she does fall every now and then or bump into things. The pain often radiates from her rib down to the front of her stomach . She has had some mild cough but no shortness of breath or production .She has had rounds of steroids for her knees and hips however this is been mostly injections. She's been taking her chronic narcotics and they have not helped the pain very much.   Review of Systems  GEN- denies fatigue, fever, weight loss,weakness, recent illness HEENT- denies eye drainage, change in vision, nasal discharge, CVS- denies chest pain, palpitations RESP- denies SOB, cough, wheeze ABD- denies N/V, change in stools, abd pain GU- denies dysuria, hematuria, dribbling, incontinence MSK- denies joint pain,+ muscle aches, injury Neuro- denies headache, dizziness, syncope, seizure activity      Objective:   Physical Exam GEN- NAD, alert and oriented x3 HEENT- PERRL, EOMI, non injected sclera, pink conjunctiva, MMM, oropharynx clear CVS- RRR, no murmur RESP-CTAB Chest wall- TTP over lower left ribs- point tenderness, no bruising seen ABD-NABS,soft,NT,ND, Left flank NT EXT- No edema Pulses- Radial, DP- 2+        Assessment & Plan:

## 2013-04-18 NOTE — Patient Instructions (Addendum)
Use the bengay on your side Get the xray done on your chest and ribs  Use the muscle relaxant, continue pain medications F/U as previous

## 2013-04-19 DIAGNOSIS — N390 Urinary tract infection, site not specified: Secondary | ICD-10-CM | POA: Diagnosis not present

## 2013-04-19 LAB — POCT URINALYSIS DIPSTICK
Blood, UA: NEGATIVE
Ketones, UA: NEGATIVE
Protein, UA: NEGATIVE
Spec Grav, UA: 1.015
Urobilinogen, UA: 0.2
pH, UA: 8

## 2013-04-19 MED ORDER — LISINOPRIL 40 MG PO TABS: ORAL_TABLET | ORAL | Status: DC

## 2013-04-19 MED ORDER — FLUOXETINE HCL 20 MG PO CAPS: ORAL_CAPSULE | ORAL | Status: DC

## 2013-04-19 MED ORDER — TRAZODONE HCL 100 MG PO TABS: ORAL_TABLET | ORAL | Status: DC

## 2013-04-19 MED ORDER — SIMVASTATIN 40 MG PO TABS: ORAL_TABLET | ORAL | Status: DC

## 2013-04-19 MED ORDER — CLONIDINE HCL 0.2 MG PO TABS: ORAL_TABLET | ORAL | Status: DC

## 2013-04-20 ENCOUNTER — Encounter: Payer: Self-pay | Admitting: Family Medicine

## 2013-04-20 ENCOUNTER — Other Ambulatory Visit: Payer: Self-pay | Admitting: Family Medicine

## 2013-04-20 DIAGNOSIS — R109 Unspecified abdominal pain: Secondary | ICD-10-CM | POA: Insufficient documentation

## 2013-04-20 NOTE — Assessment & Plan Note (Addendum)
Xray of chest and ribs negative Treat as MSK, this is is not in the abdomen TOpical rubs, muscle relaxant  Should improve with time UA unremarkable, trace leuk, no symptoms , no treatment unless culture shows something

## 2013-04-21 MED ORDER — CIPROFLOXACIN HCL 500 MG PO TABS: 500.0000 mg | ORAL_TABLET | Freq: Two times a day (BID) | ORAL | Status: AC

## 2013-04-21 NOTE — Addendum Note (Signed)
Addended by: Milinda Antis F on: 04/21/2013 05:33 PM   Modules accepted: Orders

## 2013-04-22 DIAGNOSIS — M1710 Unilateral primary osteoarthritis, unspecified knee: Secondary | ICD-10-CM | POA: Diagnosis not present

## 2013-04-22 LAB — URINE CULTURE

## 2013-04-22 MED ORDER — CYCLOBENZAPRINE HCL 5 MG PO TABS: 5.0000 mg | ORAL_TABLET | Freq: Three times a day (TID) | ORAL | Status: DC | PRN

## 2013-04-22 NOTE — Addendum Note (Signed)
Addended by: Milinda Antis F on: 04/22/2013 09:08 AM   Modules accepted: Orders

## 2013-04-28 ENCOUNTER — Telehealth: Payer: Self-pay | Admitting: Family Medicine

## 2013-04-29 ENCOUNTER — Ambulatory Visit (HOSPITAL_COMMUNITY)
Admission: RE | Admit: 2013-04-29 | Discharge: 2013-04-29 | Disposition: A | Discharge status: Home / Self Care | Payer: MEDICARE | Source: Ambulatory Visit | Attending: Family Medicine | Admitting: Family Medicine

## 2013-04-29 ENCOUNTER — Ambulatory Visit (INDEPENDENT_AMBULATORY_CARE_PROVIDER_SITE_OTHER): Payer: MEDICARE | Admitting: Family Medicine

## 2013-04-29 ENCOUNTER — Encounter: Payer: Self-pay | Admitting: Family Medicine

## 2013-04-29 VITALS — BP 106/78 | HR 75 | Resp 18 | Ht 59.0 in | Wt 190.1 lb

## 2013-04-29 DIAGNOSIS — R1012 Left upper quadrant pain: Secondary | ICD-10-CM

## 2013-04-29 DIAGNOSIS — N390 Urinary tract infection, site not specified: Secondary | ICD-10-CM

## 2013-04-29 DIAGNOSIS — K5730 Diverticulosis of large intestine without perforation or abscess without bleeding: Secondary | ICD-10-CM | POA: Diagnosis not present

## 2013-04-29 DIAGNOSIS — R109 Unspecified abdominal pain: Secondary | ICD-10-CM

## 2013-04-29 DIAGNOSIS — M1710 Unilateral primary osteoarthritis, unspecified knee: Secondary | ICD-10-CM | POA: Diagnosis not present

## 2013-04-29 LAB — POCT URINALYSIS DIPSTICK
Bilirubin, UA: NEGATIVE
Glucose, UA: NEGATIVE
Ketones, UA: NEGATIVE
Leukocytes, UA: NEGATIVE
Protein, UA: NEGATIVE

## 2013-04-29 MED ORDER — HYDROCODONE-ACETAMINOPHEN 10-325 MG PO TABS: 1.0000 | ORAL_TABLET | Freq: Four times a day (QID) | ORAL | Status: DC | PRN

## 2013-04-29 NOTE — Telephone Encounter (Signed)
Feels no better from the antibiotics. Still hurting in her back and around her side and down into her abdomen. Does she need to have anoher urine culture done? Can she bring urine in to be sent or does it require an OV. Please advise

## 2013-04-29 NOTE — Telephone Encounter (Signed)
Needs appt with Dr Jeanice Lim if no better

## 2013-04-29 NOTE — Telephone Encounter (Signed)
Last saw Dr Jeanice Lim , not sure if the symptoms are being reported from that viosit, Sounds as though she needs re evaluation.

## 2013-04-29 NOTE — Patient Instructions (Signed)
Get CT scan done today  Pain medication refilled Stop the muscle relaxant Complete the antibiotics

## 2013-05-01 DIAGNOSIS — N390 Urinary tract infection, site not specified: Secondary | ICD-10-CM | POA: Insufficient documentation

## 2013-05-01 DIAGNOSIS — R109 Unspecified abdominal pain: Secondary | ICD-10-CM | POA: Insufficient documentation

## 2013-05-01 NOTE — Assessment & Plan Note (Signed)
Complete antibiotics, rare bacteria that can cause UTI upon my research, UA neg in office today, repeat culture to ensure this has resolved

## 2013-05-01 NOTE — Progress Notes (Signed)
  Subjective:    Patient ID: Adrienne Nelson, female    DOB: 06/17/1951, 62 y.o.   MRN: [id number]  HPI   Pt seen 4/28  With left side pain after being jumped on by dog, also stated she had been coughing and had URI. Now has abd pain that radiates down to groin, no N/V, bowels are normal. Taking antibiotics for UTI, no blood in urine, no current dysuria.  Given muscle relaxant which helped pain but made her dizzy and more off balance than normal, so stopped. Her pain medication relieves the pain   Review of Systems  GEN- denies fatigue, fever, weight loss,weakness, recent illness HEENT- denies eye drainage, change in vision, nasal discharge, CVS- denies chest pain, palpitations RESP- denies SOB, cough, wheeze ABD- denies N/V, change in stools, +abd pain GU- denies dysuria, hematuria, dribbling, incontinence MSK- denies joint pain, +muscle aches, injury Neuro- denies headache, dizziness, syncope, seizure activity      Objective:   Physical Exam GEN- NAD, alert and oriented x3 HEENT- PERRL, EOMI, non injected sclera, pink conjunctiva, MMM, oropharynx clear CVS- RRR, no murmur RESP-CTAB Chest wall- TTP over lower left ribs  ABD-NABS,soft,TTP LUQ,ND,no mass felt, no CVA tenderness MSK- HIP- Fair ROM, limited due to habitus,  EXT- No edema Pulses- Radial, DP- 2+        Assessment & Plan:

## 2013-05-01 NOTE — Assessment & Plan Note (Signed)
Unchanged, note improves with muscle relaxant but due to side effects will discontinue There is still MSK component

## 2013-05-01 NOTE — Assessment & Plan Note (Signed)
Unclear cause of this side pain and radiating abdominal pain, CT of abd/pelvis negative for any acute problem, no stone seen,no infection, diverticulosis without diverticulitis. CXR neg, no rib fractures I have increased her pain medications, given 45 tabs, typical 30 once a day, pain resolves with use of medication

## 2013-05-04 NOTE — Telephone Encounter (Signed)
Patient in for office visit.  

## 2013-05-05 LAB — URINE CULTURE: Colony Count: >=100000

## 2013-05-06 DIAGNOSIS — B0223 Postherpetic polyneuropathy: Secondary | ICD-10-CM | POA: Diagnosis not present

## 2013-05-06 DIAGNOSIS — M1710 Unilateral primary osteoarthritis, unspecified knee: Secondary | ICD-10-CM | POA: Diagnosis not present

## 2013-05-06 MED ORDER — SULFAMETHOXAZOLE-TRIMETHOPRIM 800-160 MG PO TABS: 1.0000 | ORAL_TABLET | Freq: Two times a day (BID) | ORAL | Status: AC

## 2013-05-06 NOTE — Addendum Note (Signed)
Addended by: Milinda Antis F on: 05/06/2013 01:50 PM   Modules accepted: Orders

## 2013-05-17 ENCOUNTER — Other Ambulatory Visit: Payer: Self-pay | Admitting: Family Medicine

## 2013-05-17 MED ORDER — AZITHROMYCIN 250 MG PO TABS: ORAL_TABLET | ORAL | Status: AC

## 2013-05-17 MED ORDER — BENZONATATE 100 MG PO CAPS: 100.0000 mg | ORAL_CAPSULE | Freq: Four times a day (QID) | ORAL | Status: DC | PRN

## 2013-05-17 NOTE — Telephone Encounter (Signed)
X 3 days, sinus drainage and coughing with green phlegm, hoarsness. Sore throat and left ear pain. Cannot afford to come in for appt. Advised Sudafed and Robitussin and saline spray and that I would see what else was recommended.  [facility] [place]

## 2013-05-17 NOTE — Telephone Encounter (Signed)
z pack and tessalon perles prescribed, alsop pain med refilled. Pt aware

## 2013-05-20 ENCOUNTER — Telehealth: Payer: Self-pay | Admitting: Family Medicine

## 2013-05-23 ENCOUNTER — Ambulatory Visit (INDEPENDENT_AMBULATORY_CARE_PROVIDER_SITE_OTHER): Payer: MEDICARE | Admitting: Family Medicine

## 2013-05-23 ENCOUNTER — Ambulatory Visit (HOSPITAL_COMMUNITY)
Admission: RE | Admit: 2013-05-23 | Discharge: 2013-05-23 | Disposition: A | Discharge status: Home / Self Care | Payer: MEDICARE | Source: Ambulatory Visit | Attending: Family Medicine | Admitting: Family Medicine

## 2013-05-23 ENCOUNTER — Encounter: Payer: Self-pay | Admitting: Family Medicine

## 2013-05-23 VITALS — BP 104/70 | HR 98 | Temp 98.6°F | Resp 16 | Ht 59.0 in | Wt 189.0 lb

## 2013-05-23 DIAGNOSIS — E785 Hyperlipidemia, unspecified: Secondary | ICD-10-CM

## 2013-05-23 DIAGNOSIS — J029 Acute pharyngitis, unspecified: Secondary | ICD-10-CM | POA: Diagnosis not present

## 2013-05-23 DIAGNOSIS — R7309 Other abnormal glucose: Secondary | ICD-10-CM

## 2013-05-23 DIAGNOSIS — J209 Acute bronchitis, unspecified: Secondary | ICD-10-CM | POA: Diagnosis not present

## 2013-05-23 DIAGNOSIS — R0602 Shortness of breath: Secondary | ICD-10-CM | POA: Diagnosis not present

## 2013-05-23 DIAGNOSIS — F17200 Nicotine dependence, unspecified, uncomplicated: Secondary | ICD-10-CM

## 2013-05-23 DIAGNOSIS — F329 Major depressive disorder, single episode, unspecified: Secondary | ICD-10-CM

## 2013-05-23 DIAGNOSIS — R7301 Impaired fasting glucose: Secondary | ICD-10-CM | POA: Diagnosis not present

## 2013-05-23 DIAGNOSIS — I10 Essential (primary) hypertension: Secondary | ICD-10-CM

## 2013-05-23 DIAGNOSIS — R059 Cough, unspecified: Secondary | ICD-10-CM | POA: Insufficient documentation

## 2013-05-23 DIAGNOSIS — R7303 Prediabetes: Secondary | ICD-10-CM

## 2013-05-23 DIAGNOSIS — R05 Cough: Secondary | ICD-10-CM | POA: Insufficient documentation

## 2013-05-23 DIAGNOSIS — B0223 Postherpetic polyneuropathy: Secondary | ICD-10-CM

## 2013-05-23 LAB — CBC WITH DIFFERENTIAL/PLATELET
Basophils Absolute: 0.1 10*3/uL (ref 0.0–0.1)
HCT: 42.8 % (ref 36.0–46.0)
Hemoglobin: 14.8 g/dL (ref 12.0–15.0)
Lymphocytes Relative: 28 % (ref 12–46)
Lymphs Abs: 2.1 10*3/uL (ref 0.7–4.0)
Monocytes Absolute: 0.5 10*3/uL (ref 0.1–1.0)
Neutro Abs: 3.8 10*3/uL (ref 1.7–7.7)
RBC: 4.97 MIL/uL (ref 3.87–5.11)
RDW: 14.1 % (ref 11.5–15.5)
WBC: 7.4 10*3/uL (ref 4.0–10.5)

## 2013-05-23 LAB — BASIC METABOLIC PANEL
BUN: 12 mg/dL (ref 6–23)
Chloride: 100 mEq/L (ref 96–112)
Potassium: 4.6 mEq/L (ref 3.5–5.3)

## 2013-05-23 LAB — POCT RAPID STREP A (OFFICE): Rapid Strep A Screen: NEGATIVE

## 2013-05-23 MED ORDER — CLINDAMYCIN HCL 150 MG PO CAPS: 150.0000 mg | ORAL_CAPSULE | Freq: Three times a day (TID) | ORAL | Status: AC

## 2013-05-23 MED ORDER — CLONIDINE HCL 0.1 MG PO TABS: 0.1000 mg | ORAL_TABLET | Freq: Every day | ORAL | Status: DC

## 2013-05-23 NOTE — Patient Instructions (Addendum)
F/u in 2.5 month, call if you need me before  You need a CXR today also cbc and diff and chem 7 due to bronchitis and fatigue,You are being treated for bronchitis  New antibiotic , cleocin prescribed for 10 additional days.  Ear and throat exam do not show infection  I am glad the right knee responded to injections  Rehab will help to strengthen your hand  Blood pressure is low, clonidine is reduced to 0.1mg  at bedtime, ok to take half 0.2mg  tab at bedtime till done  HBA1C in 2.5 month, before visit  Please commit to smoking cessation, this will improve your health  Grind regular food like chicken, carrots, potatoes in the blender, eat that instead of junk food please

## 2013-05-23 NOTE — Progress Notes (Signed)
  Subjective:    Patient ID: Adrienne Nelson, female    DOB: 07/05/1951, 62 y.o.   MRN: [id number]  HPI The PT is here for follow up and re-evaluation of chronic medical conditions, medication management and review of any available recent lab and radiology data.  Preventive health is updated, specifically  Cancer screening and Immunization.   Questions or concerns regarding consultations or procedures which the PT has had in the interim are  Addressed.Reviewed visit to hand surgeon, now going to therapy to strengthen the hand, wants to hold on neurology eval The PT denies any adverse reactions to current medications since the last visit.  Ongoing chest congestion and cough wants help for this, c/o ear pain ad sore throat C/o fatigue Still smoking , but less , wants to quit but no date set yet      Review of Systems See HPI  Denies chest pains, palpitations and leg swelling Denies abdominal pain, nausea, vomiting,diarrhea or constipation.   Denies dysuria, frequency, hesitancy or incontinence.  Denies headaches, seizures, Denies depression, anxiety or insomnia. Denies skin break down or rash.        Objective:   Physical Exam  Patient alert and oriented and in no cardiopulmonary distress.  HEENT: No facial asymmetry, EOMI, no sinus tenderness,  oropharynx pink and moist.  Neck adequate air entry, no adenopathy.TM clear  Chest: decreased though adequate air entry, scattered crackles, no wheezes CVS: S1, S2 no murmurs, no S3.  ABD: Soft non tender. Bowel sounds normal.  Ext: No edema  MS: decreased  ROM spine, shoulders, hips and knees.  Skin: Intact, no ulcerations or rash noted.  Psych: Good eye contact, normal affect. Memory intact not anxious or depressed appearing.  CNS: CN 2-12 intact, power,  normal throughout.Except for grade 2 to 3 strength in right hand       Assessment & Plan:

## 2013-05-23 NOTE — Telephone Encounter (Signed)
Patient in for ov today

## 2013-05-25 ENCOUNTER — Telehealth: Payer: Self-pay | Admitting: Family Medicine

## 2013-05-25 ENCOUNTER — Other Ambulatory Visit: Payer: Self-pay | Admitting: Family Medicine

## 2013-05-25 MED ORDER — ERYTHROMYCIN BASE 500 MG PO TABS: 500.0000 mg | ORAL_TABLET | Freq: Four times a day (QID) | ORAL | Status: AC

## 2013-05-25 NOTE — Telephone Encounter (Signed)
Since she is symptomatic, let her know I have sent in erythromycin for 10 days, if she continues to have the symptoms of ear pain and sore throat she will be referred to ENT

## 2013-05-25 NOTE — Telephone Encounter (Signed)
Strep was negative on 6/2 please advise.

## 2013-05-25 NOTE — Telephone Encounter (Signed)
Patient aware.

## 2013-06-06 DIAGNOSIS — Z85819 Personal history of malignant neoplasm of unspecified site of lip, oral cavity, and pharynx: Secondary | ICD-10-CM | POA: Diagnosis not present

## 2013-06-06 DIAGNOSIS — R499 Unspecified voice and resonance disorder: Secondary | ICD-10-CM | POA: Diagnosis not present

## 2013-06-06 DIAGNOSIS — J383 Other diseases of vocal cords: Secondary | ICD-10-CM | POA: Diagnosis not present

## 2013-06-07 DIAGNOSIS — J029 Acute pharyngitis, unspecified: Secondary | ICD-10-CM | POA: Insufficient documentation

## 2013-06-07 NOTE — Assessment & Plan Note (Signed)
Diet controlled, continue low carb diet

## 2013-06-07 NOTE — Assessment & Plan Note (Signed)
Hyperlipidemia:Low fat diet discussed and encouraged.  Controlled, no change in medication   

## 2013-06-07 NOTE — Assessment & Plan Note (Signed)

## 2013-06-07 NOTE — Assessment & Plan Note (Signed)
Ongoing right upper extremity pain , has had eval by hand surgeon , no benefit in surgery seen , suggestion that she see neurologist Willis,but pt not interested at this time.

## 2013-06-07 NOTE — Assessment & Plan Note (Signed)
Controlled, no change in medication  

## 2013-06-07 NOTE — Assessment & Plan Note (Signed)
Rapid strep negative, no exudate in throat

## 2013-06-07 NOTE — Assessment & Plan Note (Signed)
Ongoing chest congestion additional antibiotic course prescibed and CXr ordewred

## 2013-06-07 NOTE — Assessment & Plan Note (Signed)
Over corrected, dose reduction in meds

## 2013-06-14 DIAGNOSIS — Z6838 Body mass index (BMI) 38.0-38.9, adult: Secondary | ICD-10-CM | POA: Diagnosis not present

## 2013-06-14 DIAGNOSIS — G8929 Other chronic pain: Secondary | ICD-10-CM | POA: Diagnosis not present

## 2013-06-14 DIAGNOSIS — R499 Unspecified voice and resonance disorder: Secondary | ICD-10-CM | POA: Diagnosis not present

## 2013-06-14 DIAGNOSIS — M549 Dorsalgia, unspecified: Secondary | ICD-10-CM | POA: Diagnosis not present

## 2013-06-14 DIAGNOSIS — M129 Arthropathy, unspecified: Secondary | ICD-10-CM | POA: Diagnosis not present

## 2013-06-14 DIAGNOSIS — L988 Other specified disorders of the skin and subcutaneous tissue: Secondary | ICD-10-CM | POA: Diagnosis not present

## 2013-06-14 DIAGNOSIS — Z923 Personal history of irradiation: Secondary | ICD-10-CM | POA: Diagnosis not present

## 2013-06-14 DIAGNOSIS — Z809 Family history of malignant neoplasm, unspecified: Secondary | ICD-10-CM | POA: Diagnosis not present

## 2013-06-14 DIAGNOSIS — Z87442 Personal history of urinary calculi: Secondary | ICD-10-CM | POA: Diagnosis not present

## 2013-06-14 DIAGNOSIS — R259 Unspecified abnormal involuntary movements: Secondary | ICD-10-CM | POA: Diagnosis not present

## 2013-06-14 DIAGNOSIS — K589 Irritable bowel syndrome without diarrhea: Secondary | ICD-10-CM | POA: Diagnosis not present

## 2013-06-14 DIAGNOSIS — Z880 Allergy status to penicillin: Secondary | ICD-10-CM | POA: Diagnosis not present

## 2013-06-14 DIAGNOSIS — E119 Type 2 diabetes mellitus without complications: Secondary | ICD-10-CM | POA: Diagnosis not present

## 2013-06-14 DIAGNOSIS — I10 Essential (primary) hypertension: Secondary | ICD-10-CM | POA: Diagnosis not present

## 2013-06-14 DIAGNOSIS — E7800 Pure hypercholesterolemia, unspecified: Secondary | ICD-10-CM | POA: Diagnosis not present

## 2013-06-14 DIAGNOSIS — Z79899 Other long term (current) drug therapy: Secondary | ICD-10-CM | POA: Diagnosis not present

## 2013-06-14 DIAGNOSIS — J383 Other diseases of vocal cords: Secondary | ICD-10-CM | POA: Diagnosis not present

## 2013-06-14 DIAGNOSIS — F329 Major depressive disorder, single episode, unspecified: Secondary | ICD-10-CM | POA: Diagnosis not present

## 2013-06-14 DIAGNOSIS — Z8249 Family history of ischemic heart disease and other diseases of the circulatory system: Secondary | ICD-10-CM | POA: Diagnosis not present

## 2013-06-14 DIAGNOSIS — Z87891 Personal history of nicotine dependence: Secondary | ICD-10-CM | POA: Diagnosis not present

## 2013-06-14 DIAGNOSIS — M1990 Unspecified osteoarthritis, unspecified site: Secondary | ICD-10-CM | POA: Diagnosis not present

## 2013-06-14 DIAGNOSIS — Z85819 Personal history of malignant neoplasm of unspecified site of lip, oral cavity, and pharynx: Secondary | ICD-10-CM | POA: Diagnosis not present

## 2013-06-14 DIAGNOSIS — Z7982 Long term (current) use of aspirin: Secondary | ICD-10-CM | POA: Diagnosis not present

## 2013-06-20 ENCOUNTER — Ambulatory Visit: Payer: MEDICARE | Admitting: Family Medicine

## 2013-07-06 ENCOUNTER — Other Ambulatory Visit: Payer: Self-pay | Admitting: Family Medicine

## 2013-07-08 ENCOUNTER — Other Ambulatory Visit: Payer: Self-pay | Admitting: Family Medicine

## 2013-08-08 ENCOUNTER — Other Ambulatory Visit: Payer: Self-pay

## 2013-08-08 MED ORDER — POTASSIUM CHLORIDE CRYS ER 20 MEQ PO TBCR: EXTENDED_RELEASE_TABLET | ORAL | Status: DC

## 2013-08-16 ENCOUNTER — Encounter (INDEPENDENT_AMBULATORY_CARE_PROVIDER_SITE_OTHER): Payer: Self-pay | Admitting: *Deleted

## 2013-08-17 ENCOUNTER — Encounter (INDEPENDENT_AMBULATORY_CARE_PROVIDER_SITE_OTHER): Payer: Self-pay

## 2013-08-23 ENCOUNTER — Other Ambulatory Visit: Payer: Self-pay

## 2013-08-23 MED ORDER — FUROSEMIDE 20 MG PO TABS: ORAL_TABLET | ORAL | Status: DC

## 2013-09-01 ENCOUNTER — Ambulatory Visit: Payer: MEDICARE | Admitting: Family Medicine

## 2013-09-06 ENCOUNTER — Telehealth: Payer: Self-pay

## 2013-09-06 ENCOUNTER — Other Ambulatory Visit: Payer: Self-pay

## 2013-09-06 DIAGNOSIS — I10 Essential (primary) hypertension: Secondary | ICD-10-CM

## 2013-09-06 DIAGNOSIS — R7303 Prediabetes: Secondary | ICD-10-CM

## 2013-09-06 DIAGNOSIS — E785 Hyperlipidemia, unspecified: Secondary | ICD-10-CM

## 2013-09-06 MED ORDER — BENZONATATE 100 MG PO CAPS: 100.0000 mg | ORAL_CAPSULE | Freq: Four times a day (QID) | ORAL | Status: DC | PRN

## 2013-09-06 NOTE — Telephone Encounter (Signed)
Patient aware.

## 2013-09-06 NOTE — Telephone Encounter (Signed)
pls refill the tessalon perles prescribed in May No antibiotic without visit. Labs are past due and should be had before visit , ahs had none since March , needs cmp and EGFR , lipid and HBA1C fasting

## 2013-09-06 NOTE — Telephone Encounter (Signed)
Med refilled.

## 2013-09-14 DIAGNOSIS — R7309 Other abnormal glucose: Secondary | ICD-10-CM | POA: Diagnosis not present

## 2013-09-14 DIAGNOSIS — E785 Hyperlipidemia, unspecified: Secondary | ICD-10-CM | POA: Diagnosis not present

## 2013-09-14 LAB — COMPLETE METABOLIC PANEL WITH GFR
Alkaline Phosphatase: 45 U/L (ref 39–117)
BUN: 13 mg/dL (ref 6–23)
GFR, Est Non African American: >89 mL/min
Glucose, Bld: 94 mg/dL (ref 70–99)
Sodium: 141 mEq/L (ref 135–145)
Total Bilirubin: 0.4 mg/dL (ref 0.3–1.2)

## 2013-09-14 LAB — LIPID PANEL
HDL: 58 mg/dL (ref >39)
LDL Cholesterol: 72 mg/dL (ref 0–99)
Triglycerides: 145 mg/dL (ref <150)
VLDL: 29 mg/dL (ref 0–40)

## 2013-09-19 ENCOUNTER — Other Ambulatory Visit: Payer: Self-pay

## 2013-09-19 MED ORDER — FLUOXETINE HCL 20 MG PO CAPS: ORAL_CAPSULE | ORAL | Status: DC

## 2013-09-21 ENCOUNTER — Ambulatory Visit (INDEPENDENT_AMBULATORY_CARE_PROVIDER_SITE_OTHER): Payer: MEDICARE | Admitting: Family Medicine

## 2013-09-21 ENCOUNTER — Telehealth: Payer: Self-pay | Admitting: Family Medicine

## 2013-09-21 ENCOUNTER — Encounter: Payer: Self-pay | Admitting: Family Medicine

## 2013-09-21 VITALS — BP 140/88 | HR 74 | Resp 16 | Wt 206.8 lb

## 2013-09-21 DIAGNOSIS — R7309 Other abnormal glucose: Secondary | ICD-10-CM

## 2013-09-21 DIAGNOSIS — Z1239 Encounter for other screening for malignant neoplasm of breast: Secondary | ICD-10-CM

## 2013-09-21 DIAGNOSIS — Z23 Encounter for immunization: Secondary | ICD-10-CM | POA: Diagnosis not present

## 2013-09-21 DIAGNOSIS — R7303 Prediabetes: Secondary | ICD-10-CM

## 2013-09-21 DIAGNOSIS — E049 Nontoxic goiter, unspecified: Secondary | ICD-10-CM

## 2013-09-21 DIAGNOSIS — M79672 Pain in left foot: Secondary | ICD-10-CM

## 2013-09-21 DIAGNOSIS — E785 Hyperlipidemia, unspecified: Secondary | ICD-10-CM

## 2013-09-21 DIAGNOSIS — H6122 Impacted cerumen, left ear: Secondary | ICD-10-CM

## 2013-09-21 DIAGNOSIS — H6120 Impacted cerumen, unspecified ear: Secondary | ICD-10-CM

## 2013-09-21 DIAGNOSIS — F329 Major depressive disorder, single episode, unspecified: Secondary | ICD-10-CM

## 2013-09-21 DIAGNOSIS — R5381 Other malaise: Secondary | ICD-10-CM

## 2013-09-21 DIAGNOSIS — B029 Zoster without complications: Secondary | ICD-10-CM

## 2013-09-21 DIAGNOSIS — J449 Chronic obstructive pulmonary disease, unspecified: Secondary | ICD-10-CM

## 2013-09-21 DIAGNOSIS — R32 Unspecified urinary incontinence: Secondary | ICD-10-CM | POA: Diagnosis not present

## 2013-09-21 DIAGNOSIS — I10 Essential (primary) hypertension: Secondary | ICD-10-CM

## 2013-09-21 DIAGNOSIS — F17200 Nicotine dependence, unspecified, uncomplicated: Secondary | ICD-10-CM

## 2013-09-21 DIAGNOSIS — M79641 Pain in right hand: Secondary | ICD-10-CM

## 2013-09-21 DIAGNOSIS — M79609 Pain in unspecified limb: Secondary | ICD-10-CM

## 2013-09-21 MED ORDER — TIOTROPIUM BROMIDE MONOHYDRATE 18 MCG IN CAPS: 18.0000 ug | ORAL_CAPSULE | Freq: Every day | RESPIRATORY_TRACT | Status: DC

## 2013-09-21 MED ORDER — GABAPENTIN 600 MG PO TABS: 600.0000 mg | ORAL_TABLET | Freq: Every day | ORAL | Status: DC

## 2013-09-21 MED ORDER — OXYBUTYNIN CHLORIDE ER 5 MG PO TB24: 5.0000 mg | ORAL_TABLET | Freq: Every day | ORAL | Status: AC

## 2013-09-21 MED ORDER — BUDESONIDE-FORMOTEROL FUMARATE 80-4.5 MCG/ACT IN AERO: 2.0000 | INHALATION_SPRAY | Freq: Two times a day (BID) | RESPIRATORY_TRACT | Status: DC

## 2013-09-21 NOTE — Patient Instructions (Addendum)
Annual wellness in 4 month, call if you need me before  Flu vaccine and left ear flush today  Stop at checkout for mammogram appt  New medication for incontinence of urine  New medications (2) to help wit breathing since you are again smoking and you have a very long smoking history. Please again work on quitting  Be careful with mobility so you do not fall  Wear closed shoes in the house to provide support to thefoot, I think the swelling on th e left foot is from excessive preessure on the area due to poor gait. Call for referral to podiatry when you decide  Labs are excellent, no med change  HBA1C, tSH , Vit D and chem 7 in 4 months befopre visit if possible  Please schedule appt with Dr Karilyn Cota for your rept colonoscopy

## 2013-09-21 NOTE — Telephone Encounter (Signed)
This is the usual problem with the incontinenece meds, pls see which are covered by her insurance and let me know, also check the pharmacist and see if advair will be more affordable than symbicort, get the cost and check with pt if sher can afford before ai send in. Also find out if she can get the spiriva, if not there is no alternative, i just want to "clean uip her med list" If she is in the donut hole she can apply for med thru [facility]

## 2013-09-22 ENCOUNTER — Other Ambulatory Visit: Payer: Self-pay

## 2013-09-22 MED ORDER — TRAZODONE HCL 100 MG PO TABS: ORAL_TABLET | ORAL | Status: DC

## 2013-09-24 DIAGNOSIS — M79672 Pain in left foot: Secondary | ICD-10-CM | POA: Insufficient documentation

## 2013-09-24 NOTE — Assessment & Plan Note (Signed)
Controlled, no change in medication Improved now that she is living with her sibling

## 2013-09-24 NOTE — Assessment & Plan Note (Signed)
Worsening due to recurrent , ongoing nicotine , start spiriva and symbicort if affordable

## 2013-09-24 NOTE — Progress Notes (Signed)
  Subjective:    Patient ID: Adrienne Nelson, female    DOB: [birth year], 62 y.o.   MRN: [id number]  HPI  The PT is here for follow up and re-evaluation of chronic medical conditions, medication management and review of any available recent lab and radiology data.  Preventive health is updated, specifically  Cancer screening and Immunization.    The PT denies any adverse reactions to current medications since the last visit.  C/o worsening nurianry incontinence which limits her ability to leave her home. Denies dysuria, or flank pain, she also has worsening limitation in mobility, states back , hips and feet hurt all the time, has had injection in left ip with some relief Has resumed nicotine use, no plan to quit at this time     Review of Systems See HPI Denies recent fever or chills. Denies sinus pressure, nasal congestion, has  ear pain feels as though stopped up denies sore throat. Denies chest congestion, productive cough or wheezing.Had respiratory infection several weeks ago which cleared on its own Denies chest pains, palpitations and leg swelling Denies abdominal pain, nausea, vomiting,diarrhea or constipation.   Denies headaches, seizures, numbness, or tingling. Denies uncontrolled  depression, anxiety or insomnia. Denies skin break down or rash.        Objective:   Physical Exam  Patient alert and oriented and in no cardiopulmonary distress.  HEENT: No facial asymmetry, EOMI, no sinus tenderness,  oropharynx pink and moist.  Neck supple no adenopathy.Left TM impacted partially with cerumen  Chest: Clear to auscultation bilaterally.Decreasd air entry throughout  CVS: S1, S2 no murmurs, no S3.  ABD: Soft non tender.   Ext: No edema  MS: decreased  ROM spine, shoulders, hips and knees.Tender mild swelling of lateral aspect of right foot  Skin: Intact, no ulcerations or rash noted.  Psych: Good eye contact, normal affect. Memory intact not anxious or depressed  appearing.  CNS: CN 2-12 intact, power,  normal throughout.       Assessment & Plan:

## 2013-09-24 NOTE — Assessment & Plan Note (Signed)
Controlled, no change in medication  

## 2013-09-24 NOTE — Assessment & Plan Note (Signed)
Less painful, but still extremely weak following severe zoster  approx 1 year ago

## 2013-09-24 NOTE — Assessment & Plan Note (Signed)
Controlled, no change in medication Hyperlipidemia:Low fat diet discussed and encouraged.  \ 

## 2013-09-24 NOTE — Assessment & Plan Note (Signed)
HBA1C increased from 6.0 to 6.1, pt counseled re the need to reduce carbs and sweets to prevent further worsening Her mobility is severely limited,  Hence exercise is not a feasible option

## 2013-09-24 NOTE — Assessment & Plan Note (Signed)
Pt has resumed smoking and is unwilling to consider quitting at thsi time Patient counseled for approximately 5 minutes regarding the health risks of ongoing nicotine use, specifically all types of cancer, heart disease, stroke and respiratory failure. The options available for help with cessation ,the behavioral changes to assist the process, and the option to either gradully reduce usage  Or abruptly stop.is also discussed. Pt is also encouraged to set specific goals in number of cigarettes used daily, as well as to set a quit date.

## 2013-09-24 NOTE — Assessment & Plan Note (Signed)
Worsening symptoms, no symptoms of infection, trial of oral medication

## 2013-09-24 NOTE — Assessment & Plan Note (Signed)
Increased lateral left foot pain due to repeated trauma while weight bearing , pt's right foot and ankle are very weak and she is putting excessive strain on left foot inadvertently. Closed footwear encourage to reduce direct pressure, no interest in podiatry at this time

## 2013-09-24 NOTE — Assessment & Plan Note (Signed)
successful atraumatic irigation by nursing

## 2013-09-27 DIAGNOSIS — H6120 Impacted cerumen, unspecified ear: Secondary | ICD-10-CM | POA: Diagnosis not present

## 2013-10-03 ENCOUNTER — Other Ambulatory Visit: Payer: Self-pay

## 2013-10-03 DIAGNOSIS — M76899 Other specified enthesopathies of unspecified lower limb, excluding foot: Secondary | ICD-10-CM | POA: Diagnosis not present

## 2013-10-03 DIAGNOSIS — M1710 Unilateral primary osteoarthritis, unspecified knee: Secondary | ICD-10-CM | POA: Diagnosis not present

## 2013-10-03 MED ORDER — SIMVASTATIN 40 MG PO TABS: ORAL_TABLET | ORAL | Status: DC

## 2013-10-03 MED ORDER — HYDROCODONE-ACETAMINOPHEN 10-325 MG PO TABS: ORAL_TABLET | ORAL | Status: DC

## 2013-10-05 NOTE — Telephone Encounter (Signed)
Spoke with patient and will followup to see which is more affordable with insurance.

## 2013-10-05 NOTE — Telephone Encounter (Signed)
Called and left message.

## 2013-10-06 ENCOUNTER — Ambulatory Visit (HOSPITAL_COMMUNITY): Payer: MEDICARE

## 2013-10-13 ENCOUNTER — Telehealth: Payer: Self-pay | Admitting: Family Medicine

## 2013-10-13 DIAGNOSIS — N3000 Acute cystitis without hematuria: Secondary | ICD-10-CM | POA: Diagnosis not present

## 2013-10-13 NOTE — Telephone Encounter (Signed)
Yes urine for c/s only, agfter she submits, pls explain once she takes in collect script for cipro 500mg  one twice daily #10 only, and will you pls enter and send the script in once you have spoken to her, thanks

## 2013-10-13 NOTE — Telephone Encounter (Signed)
Patient is having burning with urination and frequency.  No available appointments for today.   Is asking if she can go to lab and submit urine.  Is this ok?

## 2013-10-14 ENCOUNTER — Other Ambulatory Visit: Payer: Self-pay

## 2013-10-14 LAB — URINALYSIS, ROUTINE W REFLEX MICROSCOPIC
Leukocytes, UA: NEGATIVE
Nitrite: NEGATIVE
Specific Gravity, Urine: <1.005 — ABNORMAL LOW (ref 1.005–1.030)
pH: 6.5 (ref 5.0–8.0)

## 2013-10-14 MED ORDER — LISINOPRIL 40 MG PO TABS: ORAL_TABLET | ORAL | Status: DC

## 2013-10-14 NOTE — Telephone Encounter (Signed)
Called and left message for patient to return call.  

## 2013-10-14 NOTE — Telephone Encounter (Signed)
Med not sent to pharmacy.  Patient aware that urine shows no infection.

## 2013-10-20 ENCOUNTER — Telehealth: Payer: Self-pay | Admitting: *Deleted

## 2013-10-20 ENCOUNTER — Ambulatory Visit (HOSPITAL_COMMUNITY)
Admission: RE | Admit: 2013-10-20 | Discharge: 2013-10-20 | Disposition: A | Discharge status: Home / Self Care | Payer: MEDICARE | Source: Ambulatory Visit | Attending: Family Medicine | Admitting: Family Medicine

## 2013-10-20 DIAGNOSIS — Z1231 Encounter for screening mammogram for malignant neoplasm of breast: Secondary | ICD-10-CM | POA: Insufficient documentation

## 2013-10-20 DIAGNOSIS — R109 Unspecified abdominal pain: Secondary | ICD-10-CM

## 2013-10-20 DIAGNOSIS — Z1239 Encounter for other screening for malignant neoplasm of breast: Secondary | ICD-10-CM

## 2013-10-20 NOTE — Telephone Encounter (Signed)
Ok to order urine c/s only, needs US of the flank that is hurting her if nio urine infection, ex[plainand let her know pls

## 2013-10-20 NOTE — Telephone Encounter (Signed)
Patient thought we sent her urine for culture. I told her we didn't because it was normal. States still having bad left flank pain and has been for several weeks. No other symptoms but wants to submit some urine for culture because she said her urine has been normal here before and was found to be infected on culture. Ok to come by to submit urine for c/s only?

## 2013-10-21 DIAGNOSIS — R109 Unspecified abdominal pain: Secondary | ICD-10-CM | POA: Diagnosis not present

## 2013-10-21 NOTE — Telephone Encounter (Signed)
Patient aware and will try to make it over here today before 12 to leave urine for c/s only

## 2013-10-27 ENCOUNTER — Encounter (INDEPENDENT_AMBULATORY_CARE_PROVIDER_SITE_OTHER): Payer: Self-pay

## 2013-10-27 ENCOUNTER — Other Ambulatory Visit (INDEPENDENT_AMBULATORY_CARE_PROVIDER_SITE_OTHER): Payer: Self-pay | Admitting: *Deleted

## 2013-10-27 ENCOUNTER — Ambulatory Visit (INDEPENDENT_AMBULATORY_CARE_PROVIDER_SITE_OTHER): Payer: MEDICARE | Admitting: Family Medicine

## 2013-10-27 ENCOUNTER — Telehealth (INDEPENDENT_AMBULATORY_CARE_PROVIDER_SITE_OTHER): Payer: Self-pay | Admitting: *Deleted

## 2013-10-27 ENCOUNTER — Encounter: Payer: Self-pay | Admitting: Family Medicine

## 2013-10-27 VITALS — BP 118/74 | HR 82 | Resp 16 | Wt 212.1 lb

## 2013-10-27 DIAGNOSIS — M545 Low back pain: Secondary | ICD-10-CM | POA: Diagnosis not present

## 2013-10-27 DIAGNOSIS — Z800 Family history of malignant neoplasm of digestive organs: Secondary | ICD-10-CM

## 2013-10-27 DIAGNOSIS — Z1211 Encounter for screening for malignant neoplasm of colon: Secondary | ICD-10-CM

## 2013-10-27 DIAGNOSIS — R7309 Other abnormal glucose: Secondary | ICD-10-CM

## 2013-10-27 DIAGNOSIS — M5126 Other intervertebral disc displacement, lumbar region: Secondary | ICD-10-CM | POA: Diagnosis not present

## 2013-10-27 DIAGNOSIS — E785 Hyperlipidemia, unspecified: Secondary | ICD-10-CM

## 2013-10-27 DIAGNOSIS — R7303 Prediabetes: Secondary | ICD-10-CM

## 2013-10-27 DIAGNOSIS — M5116 Intervertebral disc disorders with radiculopathy, lumbar region: Secondary | ICD-10-CM

## 2013-10-27 DIAGNOSIS — F329 Major depressive disorder, single episode, unspecified: Secondary | ICD-10-CM

## 2013-10-27 DIAGNOSIS — E049 Nontoxic goiter, unspecified: Secondary | ICD-10-CM

## 2013-10-27 DIAGNOSIS — Z860100 Personal history of colon polyps, unspecified: Secondary | ICD-10-CM

## 2013-10-27 DIAGNOSIS — I10 Essential (primary) hypertension: Secondary | ICD-10-CM

## 2013-10-27 MED ORDER — METHYLPREDNISOLONE ACETATE 80 MG/ML IJ SUSP
80.0000 mg | Freq: Once | INTRAMUSCULAR | Status: AC
  Administered 2013-10-27: 80 mg via INTRAMUSCULAR

## 2013-10-27 MED ORDER — HYDROCODONE-ACETAMINOPHEN 10-325 MG PO TABS: ORAL_TABLET | ORAL | Status: DC

## 2013-10-27 MED ORDER — KETOROLAC TROMETHAMINE 60 MG/2ML IM SOLN
60.0000 mg | Freq: Once | INTRAMUSCULAR | Status: AC
  Administered 2013-10-27: 60 mg via INTRAMUSCULAR

## 2013-10-27 MED ORDER — PREDNISONE 5 MG PO TABS: 5.0000 mg | ORAL_TABLET | Freq: Two times a day (BID) | ORAL | Status: AC

## 2013-10-27 NOTE — Telephone Encounter (Signed)
Patient needs trilyte 

## 2013-10-27 NOTE — Progress Notes (Signed)
  Subjective:    Patient ID: Adrienne Nelson, female    DOB: 1951/01/24, 62 y.o.   MRN: [id number]  HPI 2 month h/o uncontrolled left flank pain. Pt believed this was due to UTI, urine cultures have been negative , and she has no lower urinary symptoms. 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 Denies sinus pressure, nasal congestion, ear pain or sore throat. Denies chest congestion, productive cough or wheezing. Denies chest pains, palpitations and leg swelling Denies abdominal pain, nausea, vomiting,diarrhea or constipation.   Denies dysuria, frequency, hesitancy or incontinence. Worsening generalized joint pain  and limitation in mobility. Denies headaches, seizures, numbness, or tingling. C/o depression due to uncontrolled generalized joint pains , for which she is being treated by orthopedics Denies skin break down or rash.        Objective:   Physical Exam  Patient alert and oriented and in no cardiopulmonary distress.Pt in pain and appears somewhat depressed  HEENT: No facial asymmetry, EOMI, no sinus tenderness,  oropharynx pink and moist.  Neck decreased though adequate ROM, no adenopathy.  Chest: Clear to auscultation bilaterally.  CVS: S1, S2 no murmurs, no S3.  ABD: Soft non tender. Bowel sounds normal.  Ext: No edema  MS: decreased ROM spine, shoulders, hips and knees.  Skin: Intact, no ulcerations or rash noted.  Psych: Good eye contact, flat affect. Memory intact  depressed appearing.  CNS: CN 2-12 intact,significant hearing loss. Decreased sensation and reflexes in left lower extremity       Assessment & Plan:

## 2013-10-27 NOTE — Patient Instructions (Addendum)
F/u  End January, call if you need me before  Pain you are having is from disc disease in your low back, Toradol 60mg  IM and depo medrol 80 mg IM  Today followed by prednisone for 5 days.  You are  referred for MRI of your low back  Congrats on stopping smoking  HBA1C , TSH, chem 7 and EGFR end January  Please cut back on "grazing" since you are steadily gaining weight

## 2013-10-28 DIAGNOSIS — M1710 Unilateral primary osteoarthritis, unspecified knee: Secondary | ICD-10-CM | POA: Diagnosis not present

## 2013-10-28 MED ORDER — PEG 3350-KCL-NA BICARB-NACL 420 G PO SOLR: 4000.0000 mL | Freq: Once | ORAL | Status: DC

## 2013-10-28 NOTE — Assessment & Plan Note (Signed)
Hyperlipidemia:Low fat diet discussed and encouraged.  Updated lab next visit, controlled when last checked

## 2013-10-28 NOTE — Assessment & Plan Note (Signed)
Fair control, pt would benefit from therapy, but no interest currently Not suicidal or homicidal

## 2013-10-28 NOTE — Assessment & Plan Note (Signed)
Patient educated about the importance of limiting  Carbohydrate intake , the need to commit to daily physical activity for a minimum of 30 minutes , and to commit weight loss. The fact that changes in all these areas will reduce or eliminate all together the development of diabetes is stressed.   Updated lab next visit 

## 2013-10-28 NOTE — Assessment & Plan Note (Signed)
Uncontrolled x 2 month, radiating to left groin,  Established disc disease Left lower extremity numbness and decreased reflexes needs MRI back, may need to be referred to pain management in [place]

## 2013-10-28 NOTE — Assessment & Plan Note (Signed)
Controlled, no change in medication  

## 2013-11-01 ENCOUNTER — Other Ambulatory Visit: Payer: Self-pay | Admitting: Family Medicine

## 2013-11-01 ENCOUNTER — Ambulatory Visit (HOSPITAL_COMMUNITY)
Admission: RE | Admit: 2013-11-01 | Discharge: 2013-11-01 | Disposition: A | Discharge status: Home / Self Care | Payer: MEDICARE | Source: Ambulatory Visit | Attending: Family Medicine | Admitting: Family Medicine

## 2013-11-01 DIAGNOSIS — M545 Low back pain, unspecified: Secondary | ICD-10-CM

## 2013-11-01 DIAGNOSIS — M5116 Intervertebral disc disorders with radiculopathy, lumbar region: Secondary | ICD-10-CM

## 2013-11-01 DIAGNOSIS — M47817 Spondylosis without myelopathy or radiculopathy, lumbosacral region: Secondary | ICD-10-CM | POA: Diagnosis not present

## 2013-11-01 DIAGNOSIS — M5137 Other intervertebral disc degeneration, lumbosacral region: Secondary | ICD-10-CM | POA: Insufficient documentation

## 2013-11-01 DIAGNOSIS — M51379 Other intervertebral disc degeneration, lumbosacral region without mention of lumbar back pain or lower extremity pain: Secondary | ICD-10-CM | POA: Insufficient documentation

## 2013-11-01 DIAGNOSIS — M549 Dorsalgia, unspecified: Secondary | ICD-10-CM

## 2013-11-02 NOTE — Progress Notes (Signed)
Dr placed referral to pain clinic

## 2013-11-03 ENCOUNTER — Encounter: Payer: Self-pay | Admitting: Family Medicine

## 2013-11-04 DIAGNOSIS — M76899 Other specified enthesopathies of unspecified lower limb, excluding foot: Secondary | ICD-10-CM | POA: Diagnosis not present

## 2013-11-04 DIAGNOSIS — M1710 Unilateral primary osteoarthritis, unspecified knee: Secondary | ICD-10-CM | POA: Diagnosis not present

## 2013-11-11 DIAGNOSIS — M1710 Unilateral primary osteoarthritis, unspecified knee: Secondary | ICD-10-CM | POA: Diagnosis not present

## 2013-12-01 ENCOUNTER — Other Ambulatory Visit: Payer: Self-pay

## 2013-12-01 MED ORDER — HYDROCODONE-ACETAMINOPHEN 10-325 MG PO TABS: ORAL_TABLET | ORAL | Status: DC

## 2013-12-12 ENCOUNTER — Telehealth (INDEPENDENT_AMBULATORY_CARE_PROVIDER_SITE_OTHER): Payer: Self-pay | Admitting: *Deleted

## 2013-12-12 NOTE — Telephone Encounter (Signed)
  Procedure: tcs  Reason/Indication:  Hx polyps, fam hx colon ca  Has patient had this procedure before?  Yes, 2009 scanned  If so, when, by whom and where?    Is there a family history of colon cancer?  Yes, father  Who?  What age when diagnosed?    Is patient diabetic?   Yes, diet control      Does patient have prosthetic heart valve?  no  Do you have a pacemaker?  no  Has patient ever had endocarditis? no  Has patient had joint replacement within last 12 months?  no  Does patient tend to be constipated or take laxatives? no  Is patient on Coumadin, Plavix and/or Aspirin? yes  Medications: see EPIC  Allergies: pcn  Medication Adjustment: asa 2 days  Procedure date & time: 01/04/14 at 930

## 2013-12-14 NOTE — Telephone Encounter (Signed)
agree

## 2013-12-26 DIAGNOSIS — IMO0002 Reserved for concepts with insufficient information to code with codable children: Secondary | ICD-10-CM | POA: Diagnosis not present

## 2013-12-26 DIAGNOSIS — M47817 Spondylosis without myelopathy or radiculopathy, lumbosacral region: Secondary | ICD-10-CM | POA: Diagnosis not present

## 2013-12-26 DIAGNOSIS — M47812 Spondylosis without myelopathy or radiculopathy, cervical region: Secondary | ICD-10-CM | POA: Diagnosis not present

## 2013-12-26 DIAGNOSIS — M1710 Unilateral primary osteoarthritis, unspecified knee: Secondary | ICD-10-CM | POA: Diagnosis not present

## 2013-12-26 DIAGNOSIS — M5137 Other intervertebral disc degeneration, lumbosacral region: Secondary | ICD-10-CM | POA: Diagnosis not present

## 2013-12-27 ENCOUNTER — Encounter (HOSPITAL_COMMUNITY): Payer: Self-pay | Admitting: Pharmacy Technician

## 2013-12-28 ENCOUNTER — Other Ambulatory Visit: Payer: Self-pay

## 2013-12-28 DIAGNOSIS — Z85819 Personal history of malignant neoplasm of unspecified site of lip, oral cavity, and pharynx: Secondary | ICD-10-CM | POA: Diagnosis not present

## 2013-12-28 DIAGNOSIS — J370 Chronic laryngitis: Secondary | ICD-10-CM | POA: Diagnosis not present

## 2013-12-28 DIAGNOSIS — R1310 Dysphagia, unspecified: Secondary | ICD-10-CM | POA: Diagnosis not present

## 2013-12-28 MED ORDER — HYDROCODONE-ACETAMINOPHEN 10-325 MG PO TABS: ORAL_TABLET | ORAL | Status: DC

## 2013-12-30 ENCOUNTER — Other Ambulatory Visit: Payer: Self-pay | Admitting: Family Medicine

## 2014-01-04 ENCOUNTER — Encounter (HOSPITAL_COMMUNITY): Admission: RE | Payer: Self-pay | Source: Ambulatory Visit

## 2014-01-04 ENCOUNTER — Ambulatory Visit (HOSPITAL_COMMUNITY): Admission: RE | Admit: 2014-01-04 | Payer: MEDICARE | Source: Ambulatory Visit | Admitting: Internal Medicine

## 2014-01-04 SURGERY — COLONOSCOPY
Anesthesia: Moderate Sedation

## 2014-01-09 ENCOUNTER — Ambulatory Visit (HOSPITAL_COMMUNITY)
Admission: RE | Admit: 2014-01-09 | Discharge: 2014-01-09 | Disposition: A | Discharge status: Home / Self Care | Payer: MEDICARE | Source: Ambulatory Visit | Attending: Family Medicine | Admitting: Family Medicine

## 2014-01-09 ENCOUNTER — Encounter (INDEPENDENT_AMBULATORY_CARE_PROVIDER_SITE_OTHER): Payer: Self-pay

## 2014-01-09 ENCOUNTER — Encounter: Payer: Self-pay | Admitting: Family Medicine

## 2014-01-09 ENCOUNTER — Ambulatory Visit (INDEPENDENT_AMBULATORY_CARE_PROVIDER_SITE_OTHER): Payer: MEDICARE | Admitting: Family Medicine

## 2014-01-09 VITALS — BP 150/84 | HR 79 | Temp 98.7°F | Resp 16 | Wt 222.4 lb

## 2014-01-09 DIAGNOSIS — E785 Hyperlipidemia, unspecified: Secondary | ICD-10-CM

## 2014-01-09 DIAGNOSIS — R05 Cough: Secondary | ICD-10-CM | POA: Insufficient documentation

## 2014-01-09 DIAGNOSIS — J449 Chronic obstructive pulmonary disease, unspecified: Secondary | ICD-10-CM

## 2014-01-09 DIAGNOSIS — E669 Obesity, unspecified: Secondary | ICD-10-CM

## 2014-01-09 DIAGNOSIS — I517 Cardiomegaly: Secondary | ICD-10-CM | POA: Insufficient documentation

## 2014-01-09 DIAGNOSIS — R059 Cough, unspecified: Secondary | ICD-10-CM | POA: Diagnosis not present

## 2014-01-09 DIAGNOSIS — J0100 Acute maxillary sinusitis, unspecified: Secondary | ICD-10-CM

## 2014-01-09 DIAGNOSIS — M545 Low back pain, unspecified: Secondary | ICD-10-CM

## 2014-01-09 DIAGNOSIS — J4489 Other specified chronic obstructive pulmonary disease: Secondary | ICD-10-CM

## 2014-01-09 DIAGNOSIS — F17200 Nicotine dependence, unspecified, uncomplicated: Secondary | ICD-10-CM | POA: Diagnosis not present

## 2014-01-09 DIAGNOSIS — R0602 Shortness of breath: Secondary | ICD-10-CM | POA: Insufficient documentation

## 2014-01-09 DIAGNOSIS — I10 Essential (primary) hypertension: Secondary | ICD-10-CM

## 2014-01-09 DIAGNOSIS — J9819 Other pulmonary collapse: Secondary | ICD-10-CM | POA: Diagnosis not present

## 2014-01-09 DIAGNOSIS — J209 Acute bronchitis, unspecified: Secondary | ICD-10-CM

## 2014-01-09 DIAGNOSIS — R7303 Prediabetes: Secondary | ICD-10-CM

## 2014-01-09 DIAGNOSIS — R7309 Other abnormal glucose: Secondary | ICD-10-CM

## 2014-01-09 MED ORDER — IPRATROPIUM-ALBUTEROL 0.5-2.5 (3) MG/3ML IN SOLN: 3.0000 mL | Freq: Four times a day (QID) | RESPIRATORY_TRACT | Status: DC | PRN

## 2014-01-09 MED ORDER — PREDNISONE (PAK) 5 MG PO TABS: 5.0000 mg | ORAL_TABLET | ORAL | Status: DC

## 2014-01-09 MED ORDER — PROMETHAZINE-DM 6.25-15 MG/5ML PO SYRP: ORAL_SOLUTION | ORAL | Status: DC

## 2014-01-09 MED ORDER — HYDROCODONE-ACETAMINOPHEN 10-325 MG PO TABS: ORAL_TABLET | ORAL | Status: DC

## 2014-01-09 MED ORDER — IPRATROPIUM BROMIDE 0.02 % IN SOLN
0.5000 mg | Freq: Once | RESPIRATORY_TRACT | Status: AC
  Administered 2014-01-09: 0.5 mg via RESPIRATORY_TRACT

## 2014-01-09 MED ORDER — METHYLPREDNISOLONE ACETATE 80 MG/ML IJ SUSP
80.0000 mg | Freq: Once | INTRAMUSCULAR | Status: AC
  Administered 2014-01-09: 80 mg via INTRAMUSCULAR

## 2014-01-09 MED ORDER — BENZONATATE 100 MG PO CAPS: 100.0000 mg | ORAL_CAPSULE | Freq: Two times a day (BID) | ORAL | Status: DC

## 2014-01-09 MED ORDER — SULFAMETHOXAZOLE-TMP DS 800-160 MG PO TABS: 1.0000 | ORAL_TABLET | Freq: Two times a day (BID) | ORAL | Status: AC

## 2014-01-09 MED ORDER — ALBUTEROL SULFATE (2.5 MG/3ML) 0.083% IN NEBU
2.5000 mg | INHALATION_SOLUTION | Freq: Once | RESPIRATORY_TRACT | Status: AC
  Administered 2014-01-09: 2.5 mg via RESPIRATORY_TRACT

## 2014-01-09 NOTE — Assessment & Plan Note (Addendum)
Neb treatment in office then home nebs Depomeedrol 80mg  Im Antibiotic course , decongestants and prednisone also prescribed

## 2014-01-09 NOTE — Progress Notes (Signed)
   Subjective:    Patient ID: Adrienne Nelson, female    DOB: Jun 16, 1951, 63 y.o.   MRN: [id number]  HPI  .Pt has seen pain management in [place] will be getting lower and upper epidurals and is prescribing meds for her, gabapentin dose has been increased, still needs hydrocodone transferred. ENT eval was excellent and she has ahd this since last visit Started experiencing left lower chest pain since last Tuesday, followed by shortness of breath with activity , cough productive of green sputum, also head congestion with green sputum.No fever or chills. Difficulty using symbicort due to weakness in rigth hand following Shingles 2 years ago Has smokes approx 1.5 packs per day for 20 years, will qualify for neb treatments with COPD, and needs this  Nicotine up to last week and still unable to state that she is currently quit, though she wants to     Review of Systems See HPI Denies PND, orthopnea palpitations and leg swelling Denies abdominal pain, nausea, vomiting,diarrhea or constipation.   Denies dysuria, frequency, hesitancy or incontinence.  Denies headaches, seizures, numbness, or tingling. Denies uncontrolled depression, anxiety or insomnia. Denies skin break down or rash.        Objective:   Physical Exam  Patient alert and oriented and in no cardiopulmonary distress.  HEENT: No facial asymmetry, EOMI, maxillary sinus tenderness,  oropharynx pink and moist.  Neck supple no adenopathy.TM clear  Chest: decreased air entry, bilateral wheezes and scattered crackles CVS: S1, S2 no murmurs, no S3.  ABD: Soft non tender. Bowel sounds normal.  Ext: No edema  JY:NWGNFAOZH ROM spine, shoulders, hips and knees.  Skin: Intact, no ulcerations or rash noted.  Psych: Good eye contact, normal affect. Memory intact not anxious or depressed appearing.  CNS: CN 2-12 intact, grade 3 grip in right hand       Assessment & Plan:

## 2014-01-09 NOTE — Assessment & Plan Note (Addendum)
Right maxillary pressure, antibiotic course prescribed

## 2014-01-09 NOTE — Assessment & Plan Note (Addendum)
Stopped smoking 4 days ago but unsure if she has really quit Patient counseled for approximately 5 minutes regarding the health risks of ongoing nicotine use, specifically all types of cancer, heart disease, stroke and respiratory failure. The options available for help with cessation ,the behavioral changes to assist the process, and the option to either gradully reduce usage  Or abruptly stop.is also discussed. Pt is also encouraged to set specific goals in number of cigarettes used daily, as well as to set a quit date.

## 2014-01-09 NOTE — Patient Instructions (Addendum)
F/u as before  You have COPD flare, and acute bronchitis, right maxillary sinusitis  Please get CXr today.  10 day course of antibiotic prescribed, decongestants and prednisone and a cough suppressant   Labs as before  You will get a neb machine at home due to COPD, and the medication for daily use to help breathing will be prescribed via neb machine , since you have difficulty with hand held device.  Also albuterol neb treatments are to be done every 8 hours for the next 3 to 5 days then twice daily as needed.   Dr Lyla Son wil be asked to prescribe your hydrocodne as we discussed

## 2014-01-10 ENCOUNTER — Ambulatory Visit: Payer: MEDICARE | Admitting: Family Medicine

## 2014-01-15 ENCOUNTER — Telehealth: Payer: Self-pay | Admitting: Family Medicine

## 2014-01-15 NOTE — Assessment & Plan Note (Signed)
Uncontrolled due to ongoing nicotine use as well as pt incapable of using hand held equipment correctly due to significant right hand weakness following herpes zoster affecting right eye and right upper extremity. She will have brovana, budesonide and duoneb prescribed via neb machine and is strongly encouraged to discontinue smoking also

## 2014-01-15 NOTE — Assessment & Plan Note (Signed)
Updated lab needed at/ before next visit. Patient educated about the importance of limiting  Carbohydrate intake , the need to commit to daily physical activity for a minimum of 30 minutes , and to commit weight loss. The fact that changes in all these areas will reduce or eliminate all together the development of diabetes is stressed.    

## 2014-01-15 NOTE — Assessment & Plan Note (Signed)
Being treated by painmanagement, will request that hew takes over her hydroccodone prescription

## 2014-01-15 NOTE — Telephone Encounter (Signed)
Brovan and budesonide scripts entered historically in office note of 01/09/2014. Pls print and fax along with note, as required to [facility], if you need anything else let me know, signed prescriptions will be made available for anything further for her with regard to respiratory needs

## 2014-01-15 NOTE — Assessment & Plan Note (Signed)
Elevated BO at this visit, pt reports that she feels this due mainly to uncontrolled pain, excess cough, will hold until regular visit for med change

## 2014-01-16 ENCOUNTER — Other Ambulatory Visit: Payer: Self-pay

## 2014-01-16 ENCOUNTER — Other Ambulatory Visit: Payer: Self-pay | Admitting: Family Medicine

## 2014-01-16 DIAGNOSIS — J449 Chronic obstructive pulmonary disease, unspecified: Secondary | ICD-10-CM

## 2014-01-16 MED ORDER — ARFORMOTEROL TARTRATE 15 MCG/2ML IN NEBU: 15.0000 ug | INHALATION_SOLUTION | Freq: Two times a day (BID) | RESPIRATORY_TRACT | Status: DC

## 2014-01-16 MED ORDER — BUDESONIDE 0.25 MG/2ML IN SUSP: 0.2500 mg | Freq: Two times a day (BID) | RESPIRATORY_TRACT | Status: DC

## 2014-01-17 ENCOUNTER — Other Ambulatory Visit: Payer: Self-pay | Admitting: Family Medicine

## 2014-01-17 DIAGNOSIS — E049 Nontoxic goiter, unspecified: Secondary | ICD-10-CM | POA: Diagnosis not present

## 2014-01-17 DIAGNOSIS — R946 Abnormal results of thyroid function studies: Secondary | ICD-10-CM | POA: Diagnosis not present

## 2014-01-17 DIAGNOSIS — R7309 Other abnormal glucose: Secondary | ICD-10-CM | POA: Diagnosis not present

## 2014-01-17 NOTE — Telephone Encounter (Signed)
Faxed

## 2014-01-18 LAB — COMPLETE METABOLIC PANEL WITH GFR
ALT: 13 U/L (ref 0–35)
AST: 17 U/L (ref 0–37)
Albumin: 4.1 g/dL (ref 3.5–5.2)
Alkaline Phosphatase: 46 U/L (ref 39–117)
BUN: 16 mg/dL (ref 6–23)
CO2: 33 mEq/L — ABNORMAL HIGH (ref 19–32)
Calcium: 9.3 mg/dL (ref 8.4–10.5)
Chloride: 98 mEq/L (ref 96–112)
Creat: 0.92 mg/dL (ref 0.50–1.10)
GFR, Est African American: 76 mL/min
GFR, Est Non African American: 66 mL/min
Glucose, Bld: 91 mg/dL (ref 70–99)
Potassium: 4.8 mEq/L (ref 3.5–5.3)
Sodium: 138 mEq/L (ref 135–145)
Total Bilirubin: 0.3 mg/dL (ref 0.3–1.2)
Total Protein: 6.2 g/dL (ref 6.0–8.3)

## 2014-01-18 LAB — HEMOGLOBIN A1C
Hgb A1c MFr Bld: 6.1 % — ABNORMAL HIGH (ref <5.7)
Mean Plasma Glucose: 128 mg/dL — ABNORMAL HIGH (ref <117)

## 2014-01-18 LAB — TSH: TSH: 5.851 u[IU]/mL — ABNORMAL HIGH (ref 0.350–4.500)

## 2014-01-18 LAB — T3, FREE: T3 FREE: 2.6 pg/mL (ref 2.3–4.2)

## 2014-01-18 LAB — T4, FREE: FREE T4: 1.02 ng/dL (ref 0.80–1.80)

## 2014-01-19 ENCOUNTER — Other Ambulatory Visit: Payer: Self-pay | Admitting: Family Medicine

## 2014-01-19 DIAGNOSIS — R7989 Other specified abnormal findings of blood chemistry: Secondary | ICD-10-CM

## 2014-01-19 DIAGNOSIS — E042 Nontoxic multinodular goiter: Secondary | ICD-10-CM

## 2014-01-20 DIAGNOSIS — Z79899 Other long term (current) drug therapy: Secondary | ICD-10-CM | POA: Diagnosis not present

## 2014-01-20 DIAGNOSIS — G4700 Insomnia, unspecified: Secondary | ICD-10-CM | POA: Diagnosis not present

## 2014-01-20 DIAGNOSIS — Z7982 Long term (current) use of aspirin: Secondary | ICD-10-CM | POA: Diagnosis not present

## 2014-01-20 DIAGNOSIS — F3289 Other specified depressive episodes: Secondary | ICD-10-CM | POA: Diagnosis not present

## 2014-01-20 DIAGNOSIS — I10 Essential (primary) hypertension: Secondary | ICD-10-CM | POA: Diagnosis not present

## 2014-01-20 DIAGNOSIS — IMO0002 Reserved for concepts with insufficient information to code with codable children: Secondary | ICD-10-CM | POA: Diagnosis not present

## 2014-01-20 DIAGNOSIS — E785 Hyperlipidemia, unspecified: Secondary | ICD-10-CM | POA: Diagnosis not present

## 2014-01-20 DIAGNOSIS — Z880 Allergy status to penicillin: Secondary | ICD-10-CM | POA: Diagnosis not present

## 2014-01-20 DIAGNOSIS — J449 Chronic obstructive pulmonary disease, unspecified: Secondary | ICD-10-CM | POA: Diagnosis not present

## 2014-01-20 DIAGNOSIS — M5137 Other intervertebral disc degeneration, lumbosacral region: Secondary | ICD-10-CM | POA: Diagnosis not present

## 2014-01-20 DIAGNOSIS — M47817 Spondylosis without myelopathy or radiculopathy, lumbosacral region: Secondary | ICD-10-CM | POA: Diagnosis not present

## 2014-01-20 DIAGNOSIS — Z87891 Personal history of nicotine dependence: Secondary | ICD-10-CM | POA: Diagnosis not present

## 2014-01-20 DIAGNOSIS — F411 Generalized anxiety disorder: Secondary | ICD-10-CM | POA: Diagnosis not present

## 2014-01-20 DIAGNOSIS — F329 Major depressive disorder, single episode, unspecified: Secondary | ICD-10-CM | POA: Diagnosis not present

## 2014-01-20 DIAGNOSIS — M47812 Spondylosis without myelopathy or radiculopathy, cervical region: Secondary | ICD-10-CM | POA: Diagnosis not present

## 2014-01-20 DIAGNOSIS — Z886 Allergy status to analgesic agent status: Secondary | ICD-10-CM | POA: Diagnosis not present

## 2014-01-21 ENCOUNTER — Other Ambulatory Visit: Payer: Self-pay | Admitting: Family Medicine

## 2014-01-25 ENCOUNTER — Other Ambulatory Visit: Payer: Self-pay

## 2014-01-25 ENCOUNTER — Encounter: Payer: Self-pay | Admitting: Family Medicine

## 2014-01-25 ENCOUNTER — Ambulatory Visit (INDEPENDENT_AMBULATORY_CARE_PROVIDER_SITE_OTHER): Payer: MEDICARE | Admitting: Family Medicine

## 2014-01-25 ENCOUNTER — Ambulatory Visit (HOSPITAL_COMMUNITY)
Admission: RE | Admit: 2014-01-25 | Discharge: 2014-01-25 | Disposition: A | Discharge status: Home / Self Care | Payer: MEDICARE | Source: Ambulatory Visit | Attending: Family Medicine | Admitting: Family Medicine

## 2014-01-25 VITALS — BP 120/84 | HR 69 | Resp 16 | Wt 225.0 lb

## 2014-01-25 DIAGNOSIS — E042 Nontoxic multinodular goiter: Secondary | ICD-10-CM | POA: Insufficient documentation

## 2014-01-25 DIAGNOSIS — I10 Essential (primary) hypertension: Secondary | ICD-10-CM

## 2014-01-25 DIAGNOSIS — B0223 Postherpetic polyneuropathy: Secondary | ICD-10-CM

## 2014-01-25 DIAGNOSIS — R7989 Other specified abnormal findings of blood chemistry: Secondary | ICD-10-CM

## 2014-01-25 DIAGNOSIS — R7309 Other abnormal glucose: Secondary | ICD-10-CM

## 2014-01-25 DIAGNOSIS — R7303 Prediabetes: Secondary | ICD-10-CM

## 2014-01-25 DIAGNOSIS — Z0000 Encounter for general adult medical examination without abnormal findings: Secondary | ICD-10-CM

## 2014-01-25 MED ORDER — METFORMIN HCL 500 MG PO TABS: 500.0000 mg | ORAL_TABLET | Freq: Every day | ORAL | Status: DC

## 2014-01-25 MED ORDER — HYDROCODONE-ACETAMINOPHEN 10-325 MG PO TABS: ORAL_TABLET | ORAL | Status: DC

## 2014-01-25 NOTE — Progress Notes (Signed)
Subjective:    Patient ID: Adrienne Nelson, female    DOB: 12/16/1951, 63 y.o.   MRN: [id number]  HPI Preventive Screening-Counseling & Management   Patient present here today for a Medicare annual wellness visit.   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 Down due to limitation in her ability to get around as she would like , also uncontrolled pain and weight gain. Unable to afford therapy, Not suicidal or homicidal  Over the past two weeks, have you felt little interest or pleasure in doing things? No  Have you lost interest or pleasure in daily life? No  Do you often feel hopeless? No  Do you cry easily over simple problems? No   Activities of Daily Living  In your present state of health, do you have any difficulty performing the following activities?  Driving?: difficulty due to severe arthritis Managing money?: No Feeding yourself?:No Getting from bed to chair?yes ambulates with a cane Climbing a flight of stairs?:yes Preparing food and eating?:Challenging  As far as food prep is concerned, no problem with earingBathing or showering?:to some extent , but still able to do this independently Getting dressed?:No Getting to the toilet?:No Using the toilet?:No Moving around from place to place?: yes  Fall Risk  Assessment In the past year have you fallen or had a near fall?:yes over 2 times Are you currently taking any medications that make you dizzy?:No   Hearing Difficulties: No Do you often ask people to speak up or repeat themselves?:yes, has 1 hearing aid which is painful, does not use Do you experience ringing or noises in your ears?:No Do you have difficulty understanding soft or whispered voices?:yes  Cognitive Testing  Alert? Yes Normal Appearance?Yes  Oriented to person? Yes Place? Yes  Time? Yes  Displays appropriate judgment?Yes  Can read the correct time from a watch face? yes Are you having problems remembering things?No  Advanced Directives have been discussed with the patient?Yes , full code   List the Names of Other Physician/Practitioners you currently use: Listed    Indicate any recent Medical Services you may have received from other than [facility] providers in the past year (date may be approximate).   Assessment:    Annual Wellness Exam   Plan:    During the course of the visit the patient was educated and counseled about appropriate screening and preventive services including:  A healthy diet is rich in fruit, vegetables and whole grains. Poultry fish, nuts and beans are a healthy choice for protein rather then red meat. A low sodium diet and drinking 64 ounces of water daily is generally recommended. Oils and sweet should be limited. Carbohydrates especially for those who are diabetic or overweight, should be limited to 30-45 gram per meal. It is important to eat on a regular schedule, at least  3 times daily. Snacks should be primarily fruits, vegetables or nuts. It is important that you exercise regularly at least 30 minutes 5 times a week. If you develop chest pain, have severe difficulty breathing, or feel very tired, stop exercising immediately and seek medical attention  Immunization reviewed and updated. Cancer screening reviewed and updated    Patient  Instructions (the written plan) was given to the patient.  Medicare Attestation  I have personally reviewed:  The patient's medical and social history  Their use of alcohol, tobacco or illicit drugs  Their current medications and supplements  The patient's functional ability including ADLs,fall risks, home safety risks, cognitive, and hearing and visual impairment  Diet and physical activities  Evidence for depression or mood disorders  The patient's weight, height, BMI, and visual acuity have been recorded in the chart. I have made referrals, counseling, and provided education to the patient based on review of the above and I have provided the patient with a written personalized care plan for preventive services.      Review of Systems     Objective:   Physical Exam        Assessment & Plan:

## 2014-01-25 NOTE — Assessment & Plan Note (Signed)
Annual exam as documented. Counseling done  re healthy lifestyle involving commitment to 150 minutes exercise per week, heart healthy diet, and attaining healthy weight.The importance of adequate sleep also discussed. Regular seat belt use and safe storage  of firearms if patient has them, is also discussed. Changes in health habits are decided on by the patient with goals and time frames  set for achieving them. Immunization and cancer screening needs are specifically addressed at this visit.  

## 2014-01-25 NOTE — Patient Instructions (Addendum)
F/u in 4 month, call if you need me before  New for your sugar and to help with weight is metformin one daily  STOP eating after supper, water is Ok and even one apple if you must eat  Stop the breathing treatments till I am back in touch  Discuss pain management with Dr Jenetta Downer toole  HBA1C and chem7

## 2014-01-27 ENCOUNTER — Ambulatory Visit (HOSPITAL_COMMUNITY): Payer: MEDICARE

## 2014-01-31 ENCOUNTER — Telehealth: Payer: Self-pay | Admitting: Family Medicine

## 2014-01-31 NOTE — Telephone Encounter (Signed)
Patient aware.  Letter composed.  Script ready for pick up.

## 2014-01-31 NOTE — Telephone Encounter (Signed)
Please advise 

## 2014-01-31 NOTE — Telephone Encounter (Signed)
I never did get a response from the msg I sent him, Xzaria thought he would refill, his recent note did not mention him to be prescribing. Pls print I will sign, and send a copy of the script with a note to his attention that the pain med is still from this office unless i hear otherwise, thanks

## 2014-02-13 ENCOUNTER — Other Ambulatory Visit (INDEPENDENT_AMBULATORY_CARE_PROVIDER_SITE_OTHER): Payer: Self-pay | Admitting: *Deleted

## 2014-02-13 DIAGNOSIS — Z800 Family history of malignant neoplasm of digestive organs: Secondary | ICD-10-CM

## 2014-02-13 DIAGNOSIS — Z86010 Personal history of colonic polyps: Secondary | ICD-10-CM

## 2014-02-16 ENCOUNTER — Other Ambulatory Visit: Payer: Self-pay | Admitting: Family Medicine

## 2014-02-20 ENCOUNTER — Telehealth: Payer: Self-pay | Admitting: Family Medicine

## 2014-02-20 DIAGNOSIS — W19XXXA Unspecified fall, initial encounter: Secondary | ICD-10-CM

## 2014-02-20 DIAGNOSIS — M545 Low back pain, unspecified: Secondary | ICD-10-CM

## 2014-02-20 DIAGNOSIS — R2689 Other abnormalities of gait and mobility: Secondary | ICD-10-CM

## 2014-02-20 NOTE — Telephone Encounter (Signed)
Please advise 

## 2014-02-20 NOTE — Telephone Encounter (Signed)
She needs PT eval prior to getting a  motorized wheelchair, did not want it when offered at visit due to her h/o multiple falls in the home I had recommended this. Pls refer her to [facility] Pt for eval for power wheelchair if she agrees

## 2014-02-21 NOTE — Telephone Encounter (Signed)
Spoke with patient and she would like to proceed with pt eval.

## 2014-02-21 NOTE — Telephone Encounter (Signed)
Referral entered for pt at [facility] outpatient

## 2014-02-21 NOTE — Telephone Encounter (Signed)
Attempted to reach patient by phone.  Will try again.

## 2014-02-21 NOTE — Addendum Note (Signed)
Addended by: Denman George B on: 02/21/2014 10:00 AM   Modules accepted: Orders

## 2014-02-24 ENCOUNTER — Other Ambulatory Visit: Payer: Self-pay

## 2014-02-24 MED ORDER — HYDROCODONE-ACETAMINOPHEN 10-325 MG PO TABS: ORAL_TABLET | ORAL | Status: DC

## 2014-02-28 ENCOUNTER — Ambulatory Visit (HOSPITAL_COMMUNITY)
Admission: RE | Admit: 2014-02-28 | Discharge: 2014-02-28 | Disposition: A | Discharge status: Home / Self Care | Payer: MEDICARE | Source: Ambulatory Visit | Attending: Family Medicine | Admitting: Family Medicine

## 2014-02-28 DIAGNOSIS — M545 Low back pain, unspecified: Secondary | ICD-10-CM | POA: Insufficient documentation

## 2014-02-28 DIAGNOSIS — Z9181 History of falling: Secondary | ICD-10-CM | POA: Insufficient documentation

## 2014-02-28 DIAGNOSIS — R259 Unspecified abnormal involuntary movements: Secondary | ICD-10-CM | POA: Insufficient documentation

## 2014-02-28 DIAGNOSIS — E669 Obesity, unspecified: Secondary | ICD-10-CM | POA: Insufficient documentation

## 2014-02-28 DIAGNOSIS — IMO0001 Reserved for inherently not codable concepts without codable children: Secondary | ICD-10-CM | POA: Diagnosis not present

## 2014-02-28 DIAGNOSIS — M1710 Unilateral primary osteoarthritis, unspecified knee: Secondary | ICD-10-CM | POA: Diagnosis not present

## 2014-02-28 DIAGNOSIS — M19019 Primary osteoarthritis, unspecified shoulder: Secondary | ICD-10-CM | POA: Diagnosis not present

## 2014-02-28 DIAGNOSIS — IMO0002 Reserved for concepts with insufficient information to code with codable children: Secondary | ICD-10-CM

## 2014-02-28 NOTE — Evaluation (Signed)
Occupational Therapy Evaluation  Patient Details  Name: ANGELINA VENARD MRN: [id number] Date of Birth: [birth year]  Today's Date: 02/28/2014 Time: 1110-1140 OT Time Calculation (min): 30 min  Past Medical History:  Past Medical History  Diagnosis Date  . Hand pain     bilateral    . Arthritis of knee, right   . Throat cancer   . Depression   . Hearing loss   . Ulcerative colitis   . Hyperlipidemia   . Type 2 diabetes mellitus without complications    Past Surgical History:  Past Surgical History  Procedure Laterality Date  . Cesarean section    . Cesarean section    . Carpal tunnel release    . Cholecystectomy    . Total abdominal hysterectomy    . Left trigger finger surgery  Dec. 2011   Problem List Patient Active Problem List   Diagnosis Date Noted  . Routine general medical examination at a health care facility 01/25/2014  . Lumbar back pain 10/27/2013  . Incontinence of urine 09/21/2013  . COPD (chronic obstructive pulmonary disease) with chronic bronchitis 09/21/2013  . Carpal tunnel syndrome 03/15/2013  . Nicotine dependence 03/15/2013  . Shingles (herpes zoster) polyneuropathy 09/26/2012  . Depression 09/26/2012  . Fall 04/13/2012  . Prediabetes 01/16/2012  . GOITER, UNSPECIFIED 10/04/2009  . TREMOR 07/13/2009  . CAROTID BRUIT 07/13/2009  . FATIGUE 10/09/2008  . THROAT CANCER 04/06/2008  . HYPERLIPIDEMIA 04/06/2008  . OBESITY 04/06/2008  . HEARING LOSS 04/06/2008  . HYPERTENSION 04/06/2008  . ULCERATIVE COLITIS 04/06/2008  . ARTHRITIS, RIGHT KNEE 04/06/2008   02/28/2014 RE:     Brittny Spangle  [place], [place] [address] DOB:  09/23/2051  Insurance:  [employer]  To Whom It May Concern,  Ms. Fakhouri was seen in this clinic today for a face to face power wheelchair assessment.  Ms. Santacroce is a 63 year old female with a past medical history that includes DM type 2, central tremor, obesity, shingles, lumbar back pain, spondylosis, carpal tunnel  syndrome, COPD, bilateral shoulder arthritis, left knee and leg pain, right knee arthritis.  She falls several times per week while ambulating in her home.  Due to her frequent falls, she spends most of the day in her bedroom.   Ms. Paiz lives with her sister and her sister's husband in a one story home.  She is able to dress herself, with assistance for socks and shoes.  She is able to bathe herself with increased time to complete the activity.  She is able to toilet on her own.  She requires assistance with combing her hair.  She is not able to complete any IADLs and drives very infrequently.  Ms. Zazueta would benefit from a power wheelchair to improve her safety, decrease her amount of falls, and increase her independence with daily activities.  A FULL PHYSICAL ASSESSMENT REVEALS THE FOLLOWING    Existing Equipment: Ms. Hamor has a straight cane and grab bars in the bathroom.  Transfers:   SBA with transfers from sit to stand. Ambulation:  Ms. Deprey ambulated 15 feet with her straight cane and contact guard assistance due to her decreased balance. Head and Neck: Neck range of motion is functional.   Trunk:  WFL.   Pelvis:    WFL.  Hip:   50% AROM BLE.  Knees:  75% AROM BLE.   Feet and Ankles: 50% AROM BLE.  Upper Extremities: BUE AROM is WFL.  LUE strength is 3+/5.  RUE strength is 3/5. Lower Extremities: BLE strength is 3/5.  Weight Shifting Ability: Independent.  Skin Integrity: intact.  Activity Tolerance:  poor due to COPD.  Cognition:  WFL.  GOALS/OBJECTIVE OF SEATING INTERVENTION  Recommendations: Ms. Steinmiller would benefit from a power wheelchair for increased independence and safety in her home.    If you require any further information concerning Ms. Perrault's positioning, independence or mobility needs; or any further information why a lesser device will not work, please do not hesitate to contact me at [facility], [place] [address],  [address] [phone].   Thank you for this referral,   ____________________       __________ Penelope Galas, OTR/L        Date          GO Functional Assessment Tool Used: clinical observation Functional Limitation: Mobility: Walking and moving around Mobility: Walking and Moving Around Current Status [phone]): At least 40 percent but less than 60 percent impaired, limited or restricted Mobility: Walking and Moving Around Goal Status [phone]): At least 40 percent but less than 60 percent impaired, limited or restricted Mobility: Walking and Moving Around Discharge Status [phone]): At least 40 percent but less than 60 percent impaired, limited or restricted  Arbutus Ped 02/28/2014, 12:05 PM  Physician Documentation Your signature is required to indicate approval of the treatment plan as stated above.  Please sign and either send electronically or make a copy of this report for your files and return this physician signed original.  Please mark one 1.__approve of plan  2. ___approve of plan with the following conditions.   ______________________________                                                          _____________________ Physician Signature                                                                                                             Date

## 2014-03-06 ENCOUNTER — Telehealth: Payer: Self-pay | Admitting: Family Medicine

## 2014-03-07 NOTE — Telephone Encounter (Signed)
I already printed the occupational therapy note which is who evaluated her for wheelchair and stated she did qualify.  [employer] does not give narratives regarding this. They are not allowed.

## 2014-03-07 NOTE — Telephone Encounter (Signed)
pls specifically send to pT for their recommendation forPWC to be sent over, I do not see this in record currently and pt calling in about it. Also pls get narrative from supplier [employer] that is required to be included in note for medicare to cover , thanks (I am keeping copies of these "required statements " for future reference)

## 2014-03-10 ENCOUNTER — Telehealth (INDEPENDENT_AMBULATORY_CARE_PROVIDER_SITE_OTHER): Payer: Self-pay | Admitting: *Deleted

## 2014-03-10 NOTE — Telephone Encounter (Signed)
  Procedure: tcs  Reason/Indication:  Hx polyps, fam hx colon ca  Has patient had this procedure before?  Yes, 2009 -- scanned  If so, when, by whom and where?    Is there a family history of colon cancer?  Yes, father  Who?  What age when diagnosed?    Is patient diabetic?   Yes, diet dontrol      Does patient have prosthetic heart valve?  no  Do you have a pacemaker?  no  Has patient ever had endocarditis? no  Has patient had joint replacement within last 12 months?  no  Does patient tend to be constipated or take laxatives? no  Is patient on Coumadin, Plavix and/or Aspirin? yes  Medications: see EPIC  Allergies: see EPIC  Medication Adjustment: asa 2 days  Procedure date & time: 04/06/14

## 2014-03-13 NOTE — Telephone Encounter (Signed)
agree

## 2014-03-15 ENCOUNTER — Other Ambulatory Visit: Payer: Self-pay | Admitting: Family Medicine

## 2014-03-17 ENCOUNTER — Other Ambulatory Visit: Payer: Self-pay

## 2014-03-17 MED ORDER — HYDROCODONE-ACETAMINOPHEN 10-325 MG PO TABS: ORAL_TABLET | ORAL | Status: DC

## 2014-03-23 ENCOUNTER — Encounter (HOSPITAL_COMMUNITY): Payer: Self-pay | Admitting: Pharmacy Technician

## 2014-03-23 DIAGNOSIS — I10 Essential (primary) hypertension: Secondary | ICD-10-CM | POA: Diagnosis not present

## 2014-03-23 DIAGNOSIS — G4700 Insomnia, unspecified: Secondary | ICD-10-CM | POA: Diagnosis not present

## 2014-03-23 DIAGNOSIS — E785 Hyperlipidemia, unspecified: Secondary | ICD-10-CM | POA: Diagnosis not present

## 2014-03-23 DIAGNOSIS — J449 Chronic obstructive pulmonary disease, unspecified: Secondary | ICD-10-CM | POA: Diagnosis not present

## 2014-03-23 DIAGNOSIS — F3289 Other specified depressive episodes: Secondary | ICD-10-CM | POA: Diagnosis not present

## 2014-03-23 DIAGNOSIS — Z880 Allergy status to penicillin: Secondary | ICD-10-CM | POA: Diagnosis not present

## 2014-03-23 DIAGNOSIS — Z79899 Other long term (current) drug therapy: Secondary | ICD-10-CM | POA: Diagnosis not present

## 2014-03-23 DIAGNOSIS — Z87891 Personal history of nicotine dependence: Secondary | ICD-10-CM | POA: Diagnosis not present

## 2014-03-23 DIAGNOSIS — F329 Major depressive disorder, single episode, unspecified: Secondary | ICD-10-CM | POA: Diagnosis not present

## 2014-03-23 DIAGNOSIS — M47817 Spondylosis without myelopathy or radiculopathy, lumbosacral region: Secondary | ICD-10-CM | POA: Diagnosis not present

## 2014-03-23 DIAGNOSIS — M5137 Other intervertebral disc degeneration, lumbosacral region: Secondary | ICD-10-CM | POA: Diagnosis not present

## 2014-03-23 DIAGNOSIS — F411 Generalized anxiety disorder: Secondary | ICD-10-CM | POA: Diagnosis not present

## 2014-03-23 DIAGNOSIS — Z7982 Long term (current) use of aspirin: Secondary | ICD-10-CM | POA: Diagnosis not present

## 2014-03-23 DIAGNOSIS — IMO0002 Reserved for concepts with insufficient information to code with codable children: Secondary | ICD-10-CM | POA: Diagnosis not present

## 2014-03-23 DIAGNOSIS — Z886 Allergy status to analgesic agent status: Secondary | ICD-10-CM | POA: Diagnosis not present

## 2014-03-23 DIAGNOSIS — M47812 Spondylosis without myelopathy or radiculopathy, cervical region: Secondary | ICD-10-CM | POA: Diagnosis not present

## 2014-03-29 ENCOUNTER — Encounter: Payer: Self-pay | Admitting: Family Medicine

## 2014-03-29 ENCOUNTER — Encounter (INDEPENDENT_AMBULATORY_CARE_PROVIDER_SITE_OTHER): Payer: Self-pay

## 2014-03-29 ENCOUNTER — Ambulatory Visit (INDEPENDENT_AMBULATORY_CARE_PROVIDER_SITE_OTHER): Payer: MEDICARE | Admitting: Family Medicine

## 2014-03-29 VITALS — BP 140/84 | HR 85 | Resp 16 | Wt 229.0 lb

## 2014-03-29 DIAGNOSIS — G5600 Carpal tunnel syndrome, unspecified upper limb: Secondary | ICD-10-CM

## 2014-03-29 DIAGNOSIS — R7303 Prediabetes: Secondary | ICD-10-CM

## 2014-03-29 DIAGNOSIS — I10 Essential (primary) hypertension: Secondary | ICD-10-CM | POA: Diagnosis not present

## 2014-03-29 DIAGNOSIS — R7309 Other abnormal glucose: Secondary | ICD-10-CM

## 2014-03-29 DIAGNOSIS — M47816 Spondylosis without myelopathy or radiculopathy, lumbar region: Secondary | ICD-10-CM

## 2014-03-29 DIAGNOSIS — Z9181 History of falling: Secondary | ICD-10-CM | POA: Diagnosis not present

## 2014-03-29 DIAGNOSIS — M47817 Spondylosis without myelopathy or radiculopathy, lumbosacral region: Secondary | ICD-10-CM

## 2014-03-29 DIAGNOSIS — R296 Repeated falls: Secondary | ICD-10-CM

## 2014-03-29 MED ORDER — FUROSEMIDE 20 MG PO TABS: ORAL_TABLET | ORAL | Status: DC

## 2014-03-29 MED ORDER — CLONIDINE HCL 0.1 MG PO TABS: ORAL_TABLET | ORAL | Status: DC

## 2014-03-29 NOTE — Assessment & Plan Note (Signed)
Chronic uncontrolled back pain with lower extremity weakness and multiple falls. Needs home power wheelchair for safety

## 2014-03-29 NOTE — Patient Instructions (Signed)
F/u as before 

## 2014-03-29 NOTE — Assessment & Plan Note (Signed)
Controlled, no change in medication  

## 2014-03-29 NOTE — Assessment & Plan Note (Signed)
Patient educated about the importance of limiting  Carbohydrate intake and to commit weight loss. The fact that changes in all these areas will reduce or eliminate all together the development of diabetes is stressed.   Updated lab needed at/ before next visit.

## 2014-03-29 NOTE — Progress Notes (Signed)
   Subjective:    Patient ID: Adrienne Nelson, female    DOB: 04-01-1951, 63 y.o.   MRN: [id number]  HPI Pt  In for power  wheelchair assessment I have reviewed and concur with the PT evaluation. Patient is unable to use  a cane or walker for independent safe ambulation due to upper and lower extremity weakness, Adrienne Nelson is unable to use a manual wheelchair due to upper extremity weakness she is unable to self propel. Her home environment is unsuitable for a scooter . It is my opinion that Adrienne Nelson is physically and mentally able to safely operate a power wheelchair. Adrienne Nelson agrees to use the power wheelchair consistently in her home Adrienne Nelson states she experiences generalized pain at an 8 most of the day. Pain involves from neck ,entire left leg , right knee,  Patient reports constantly running into walls and furniture due to instability and says she falls on average several times per week Safe moving from room to room in the house requires a power wheelchair   Review of Systems See HPI     Objective:   Physical Exam BP 140/84  Pulse 85  Resp 16  Wt 229 lb (103.874 kg)  SpO2 92% Patient alert and oriented and in no cardiopulmonary distress.  HEENT: No facial asymmetry, EOMI, no sinus tenderness,  oropharynx pink and moist.  Neck decreased ROM no adenopathy.  Chest: Clear to auscultation bilaterally.decreased though adequate  CVS: S1, S2 no murmurs, no S3.  ABD: Soft non tender.   Ext: No edema  Adrienne: Reduced  ROM spine, shoulders, hips and knees.  Skin: Intact, multiple bruises from falls  But no breakdown  Psych: Good eye contact, normal affect. Memory intact not anxious or depressed appearing.  CNS: CN 2-12 intact, power, reduced in upper and lower extremities, grade 3/5 in lower extremities, right upper 3/5, left upper 3+ /5      Assessment & Plan:  Multiple falls Assessment completed as documented explaining the need for a power wheelchair for Adrienne  Nelson. Safe ambulation in her home requires this device.   Lumbar spondylosis Chronic uncontrolled back pain with lower extremity weakness and multiple falls. Needs home power wheelchair for safety  Carpal tunnel syndrome Bilateral hand weakness right more than left preventing patient from securely holding a cane or walker to maintain upright posture, need power wheelchair  HYPERTENSION Controlled, no change in medication   Prediabetes Patient educated about the importance of limiting  Carbohydrate intake and to commit weight loss. The fact that changes in all these areas will reduce or eliminate all together the development of diabetes is stressed.   Updated lab needed at/ before next visit.

## 2014-03-29 NOTE — Assessment & Plan Note (Signed)
Bilateral hand weakness right more than left preventing patient from securely holding a cane or walker to maintain upright posture, need power wheelchair

## 2014-03-29 NOTE — Assessment & Plan Note (Signed)
Assessment completed as documented explaining the need for a power wheelchair for Adrienne Nelson. Safe ambulation in her home requires this device.

## 2014-04-06 ENCOUNTER — Encounter (HOSPITAL_COMMUNITY)
Admission: RE | Disposition: A | Discharge status: Home / Self Care | Payer: Self-pay | Source: Ambulatory Visit | Attending: Internal Medicine

## 2014-04-06 ENCOUNTER — Encounter (HOSPITAL_COMMUNITY): Payer: Self-pay | Admitting: *Deleted

## 2014-04-06 ENCOUNTER — Ambulatory Visit (HOSPITAL_COMMUNITY)
Admission: RE | Admit: 2014-04-06 | Discharge: 2014-04-06 | Disposition: A | Discharge status: Home / Self Care | Payer: MEDICARE | Source: Ambulatory Visit | Attending: Internal Medicine | Admitting: Internal Medicine

## 2014-04-06 DIAGNOSIS — I10 Essential (primary) hypertension: Secondary | ICD-10-CM | POA: Insufficient documentation

## 2014-04-06 DIAGNOSIS — E119 Type 2 diabetes mellitus without complications: Secondary | ICD-10-CM | POA: Insufficient documentation

## 2014-04-06 DIAGNOSIS — E785 Hyperlipidemia, unspecified: Secondary | ICD-10-CM | POA: Diagnosis not present

## 2014-04-06 DIAGNOSIS — Z79899 Other long term (current) drug therapy: Secondary | ICD-10-CM | POA: Diagnosis not present

## 2014-04-06 DIAGNOSIS — D129 Benign neoplasm of anus and anal canal: Secondary | ICD-10-CM

## 2014-04-06 DIAGNOSIS — D128 Benign neoplasm of rectum: Secondary | ICD-10-CM | POA: Diagnosis not present

## 2014-04-06 DIAGNOSIS — Z860100 Personal history of colon polyps, unspecified: Secondary | ICD-10-CM | POA: Insufficient documentation

## 2014-04-06 DIAGNOSIS — F17200 Nicotine dependence, unspecified, uncomplicated: Secondary | ICD-10-CM | POA: Diagnosis not present

## 2014-04-06 DIAGNOSIS — Z7982 Long term (current) use of aspirin: Secondary | ICD-10-CM | POA: Diagnosis not present

## 2014-04-06 DIAGNOSIS — Z800 Family history of malignant neoplasm of digestive organs: Secondary | ICD-10-CM | POA: Insufficient documentation

## 2014-04-06 DIAGNOSIS — Z09 Encounter for follow-up examination after completed treatment for conditions other than malignant neoplasm: Secondary | ICD-10-CM | POA: Diagnosis not present

## 2014-04-06 DIAGNOSIS — K5730 Diverticulosis of large intestine without perforation or abscess without bleeding: Secondary | ICD-10-CM | POA: Diagnosis not present

## 2014-04-06 DIAGNOSIS — D126 Benign neoplasm of colon, unspecified: Secondary | ICD-10-CM | POA: Diagnosis not present

## 2014-04-06 HISTORY — DX: Essential (primary) hypertension: I10

## 2014-04-06 HISTORY — PX: COLONOSCOPY: SHX5424

## 2014-04-06 HISTORY — DX: Pure hypercholesterolemia, unspecified: E78.00

## 2014-04-06 LAB — GLUCOSE, CAPILLARY: Glucose-Capillary: 116 mg/dL — ABNORMAL HIGH (ref 70–99)

## 2014-04-06 SURGERY — COLONOSCOPY
Anesthesia: Moderate Sedation

## 2014-04-06 MED ORDER — MIDAZOLAM HCL 5 MG/5ML IJ SOLN
INTRAMUSCULAR | Status: DC | PRN
  Administered 2014-04-06: 2 mg via INTRAVENOUS
  Administered 2014-04-06: 3 mg via INTRAVENOUS

## 2014-04-06 MED ORDER — MEPERIDINE HCL 50 MG/ML IJ SOLN
INTRAMUSCULAR | Status: AC
  Filled 2014-04-06: qty 1

## 2014-04-06 MED ORDER — STERILE WATER FOR IRRIGATION IR SOLN
Status: DC | PRN
  Administered 2014-04-06: 09:00:00

## 2014-04-06 MED ORDER — MEPERIDINE HCL 50 MG/ML IJ SOLN
INTRAMUSCULAR | Status: DC | PRN
  Administered 2014-04-06 (×2): 25 mg via INTRAVENOUS

## 2014-04-06 MED ORDER — MIDAZOLAM HCL 5 MG/5ML IJ SOLN
INTRAMUSCULAR | Status: AC
  Filled 2014-04-06: qty 10

## 2014-04-06 MED ORDER — SODIUM CHLORIDE 0.9 % IV SOLN
INTRAVENOUS | Status: DC
  Administered 2014-04-06: 08:00:00 via INTRAVENOUS

## 2014-04-06 NOTE — H&P (Signed)
Adrienne Nelson is an 63 y.o. female.   Chief Complaint: Patient is  here for colonoscopy. HPI: Patient is 63 year old Caucasian female with multiple medical problems who also has history of ulcerative colitis was in remission as well as history of polyps and family history of colon carcinoma. She is here for surveillance colonoscopy. She denies abdominal pain or rectal bleeding. She generally has three soft stools daily. Last colonoscopy was in September 2001 at [facility] and no polyps were found. At that time she was in endoscopic remission as far as UC is concerned. Family history significant for colon carcinoma in her father who was late 72s at the time of diagnosis and died at 66 or 70.  Past Medical History  Diagnosis Date  . Hand pain     bilateral    . Arthritis of knee, right   . Throat cancer   . Depression   . Hearing loss   . Ulcerative colitis   . Hyperlipidemia   . Type 2 diabetes mellitus without complications   . Hypertension   . Hypercholesteremia     Past Surgical History  Procedure Laterality Date  . Cesarean section    . Cesarean section    . Carpal tunnel release    . Cholecystectomy    . Total abdominal hysterectomy    . Left trigger finger surgery  Dec. 2011    Family History  Problem Relation Age of Onset  . Lung cancer Mother   . Colon cancer Father    Social History:  reports that she has been smoking Cigarettes.  She has a 10 pack-year smoking history. She does not have any smokeless tobacco history on file. She reports that she does not drink alcohol or use illicit drugs.  Allergies:  Allergies  Allergen Reactions  . Penicillins Anaphylaxis  . Ibuprofen Other (See Comments)    Irritates stomach    Medications Prior to Admission  Medication Sig Dispense Refill  . aspirin (ASPIRIN LOW DOSE) 81 MG EC tablet Take 81 mg by mouth daily.        . Calcium Carbonate-Vitamin D (CALCIUM 600+D) 600-400 MG-UNIT per tablet Take 1 tablet by mouth 3 (three)  times daily with meals.        . cloNIDine (CATAPRES) 0.1 MG tablet TAKE ONE TABLET BY MOUTH AT BEDTIME  30 tablet  3  . FLUoxetine (PROZAC) 20 MG capsule TAKE TWO CAPSULES BY MOUTH ONCE DAILY  60 capsule  2  . furosemide (LASIX) 20 MG tablet TAKE ONE TABLET BY MOUTH ONCE DAILY  30 tablet  3  . gabapentin (NEURONTIN) 600 MG tablet Take 1 tablet (600 mg total) by mouth 2 (two) times daily.  60 tablet  0  . HYDROcodone-acetaminophen (NORCO) 10-325 MG per tablet TAKE ONE TABLET BY MOUTH AT BEDTIME  30 tablet  0  . KLOR-CON M20 20 MEQ tablet TAKE ONE TABLET BY MOUTH TWICE DAILY  60 tablet  3  . lisinopril (PRINIVIL,ZESTRIL) 40 MG tablet TAKE ONE TABLET BY MOUTH ONCE DAILY  30 tablet  2  . metFORMIN (GLUCOPHAGE) 500 MG tablet Take 1 tablet (500 mg total) by mouth daily with breakfast.  30 tablet  5  . Multiple Vitamin (MULTIVITAMIN) tablet Take 1 tablet by mouth daily.        . simvastatin (ZOCOR) 40 MG tablet TAKE ONE TABLET BY MOUTH AT BEDTIME  30 tablet  2  . traZODone (DESYREL) 100 MG tablet TAKE THREE TABLETS BY MOUTH AT BEDTIME  90 tablet  3    Results for orders placed during the hospital encounter of 04/06/14 (from the past 48 hour(s))  GLUCOSE, CAPILLARY     Status: Abnormal   Collection Time    04/06/14  7:49 AM      Result Value Ref Range   Glucose-Capillary 116 (*) 70 - 99 mg/dL   Comment 1 Documented in Chart     No results found.  ROS  Blood pressure 145/76, pulse 79, temperature 98 F (36.7 C), temperature source Oral, resp. rate 14, SpO2 94.00%. Physical Exam  Constitutional: She appears well-developed and well-nourished.  HENT:  Mouth/Throat: Oropharynx is clear and moist.  Eyes: Conjunctivae are normal. No scleral icterus.  Neck: No thyromegaly present.  Cardiovascular: Normal rate, regular rhythm and normal heart sounds.   No murmur heard. Respiratory: Effort normal and breath sounds normal.  GI:  Abdomen is soft and nontender with organomegaly or masses   Musculoskeletal: She exhibits no edema.  Lymphadenopathy:    She has no cervical adenopathy.  Neurological: She is alert.  Skin: Skin is warm and dry.     Assessment/Plan History of colonic polyps. History of ulcerative colitis(in remission). Family history of colon carcinoma in father at late onset. Surveillance colonoscopy.  Rogene Houston 04/06/2014, 8:38 AM

## 2014-04-06 NOTE — Discharge Instructions (Signed)
Resume usual medications and high fiber diet. No driving for 24 hours. Physician will call with biopsy results  Colonoscopy, Care After Refer to this sheet in the next few weeks. These instructions provide you with information on caring for yourself after your procedure. Your health care provider may also give you more specific instructions. Your treatment has been planned according to current medical practices, but problems sometimes occur. Call your health care provider if you have any problems or questions after your procedure. WHAT TO EXPECT AFTER THE PROCEDURE  After your procedure, it is typical to have the following:  A small amount of blood in your stool.  Moderate amounts of gas and mild abdominal cramping or bloating. HOME CARE INSTRUCTIONS  Do not drive, operate machinery, or sign important documents for 24 hours.  You may shower and resume your regular physical activities, but move at a slower pace for the first 24 hours.  Take frequent rest periods for the first 24 hours.  Walk around or put a warm pack on your abdomen to help reduce abdominal cramping and bloating.  Drink enough fluids to keep your urine clear or pale yellow.  You may resume your normal diet as instructed by your health care provider. Avoid heavy or fried foods that are hard to digest.  Avoid drinking alcohol for 24 hours or as instructed by your health care provider.  Only take over-the-counter or prescription medicines as directed by your health care provider.  If a tissue sample (biopsy) was taken during your procedure:  Do not take aspirin or blood thinners for 7 days, or as instructed by your health care provider.  Do not drink alcohol for 7 days, or as instructed by your health care provider.  Eat soft foods for the first 24 hours. SEEK MEDICAL CARE IF: You have persistent spotting of blood in your stool 2 3 days after the procedure. SEEK IMMEDIATE MEDICAL CARE IF:  You have more than a  small spotting of blood in your stool.  You pass large blood clots in your stool.  Your abdomen is swollen (distended).  You have nausea or vomiting.  You have a fever.  You have increasing abdominal pain that is not relieved with medicine. Diverticulosis Diverticulosis is a common condition that develops when small pouches (diverticula) form in the wall of the colon. The risk of diverticulosis increases with age. It happens more often in people who eat a low-fiber diet. Most individuals with diverticulosis have no symptoms. Those individuals with symptoms usually experience abdominal pain, constipation, or loose stools (diarrhea). HOME CARE INSTRUCTIONS   Increase the amount of fiber in your diet as directed by your caregiver or dietician. This may reduce symptoms of diverticulosis.  Your caregiver may recommend taking a dietary fiber supplement.  Drink at least 6 to 8 glasses of water each day to prevent constipation.  Try not to strain when you have a bowel movement.  Your caregiver may recommend avoiding nuts and seeds to prevent complications, although this is still an uncertain benefit.  Only take over-the-counter or prescription medicines for pain, discomfort, or fever as directed by your caregiver. FOODS WITH HIGH FIBER CONTENT INCLUDE:  Fruits. Apple, peach, pear, tangerine, raisins, prunes.  Vegetables. Brussels sprouts, asparagus, broccoli, cabbage, carrot, cauliflower, romaine lettuce, spinach, summer squash, tomato, winter squash, zucchini.  Starchy Vegetables. Baked beans, kidney beans, lima beans, split peas, lentils, potatoes (with skin).  Grains. Whole wheat bread, brown rice, bran flake cereal, plain oatmeal, white rice, shredded wheat,  bran muffins. SEEK IMMEDIATE MEDICAL CARE IF:   You develop increasing pain or severe bloating.  You have an oral temperature above 102 F (38.9 C), not controlled by medicine.  You develop vomiting or bowel movements that  are bloody or black. High-Fiber Diet Fiber is found in fruits, vegetables, and grains. A high-fiber diet encourages the addition of more whole grains, legumes, fruits, and vegetables in your diet. The recommended amount of fiber for adult males is 38 g per day. For adult females, it is 25 g per day. Pregnant and lactating women should get 28 g of fiber per day. If you have a digestive or bowel problem, ask your caregiver for advice before adding high-fiber foods to your diet. Eat a variety of high-fiber foods instead of only a select few type of foods.  PURPOSE  To increase stool bulk.  To make bowel movements more regular to prevent constipation.  To lower cholesterol.  To prevent overeating. WHEN IS THIS DIET USED?  It may be used if you have constipation and hemorrhoids.  It may be used if you have uncomplicated diverticulosis (intestine condition) and irritable bowel syndrome.  It may be used if you need help with weight management.  It may be used if you want to add it to your diet as a protective measure against atherosclerosis, diabetes, and cancer. SOURCES OF FIBER  Whole-grain breads and cereals.  Fruits, such as apples, oranges, bananas, berries, prunes, and pears.  Vegetables, such as green peas, carrots, sweet potatoes, beets, broccoli, cabbage, spinach, and artichokes.  Legumes, such split peas, soy, lentils.  Almonds. FIBER CONTENT IN FOODS Starches and Grains / Dietary Fiber (g)  Cheerios, 1 cup / 3 g  Corn Flakes cereal, 1 cup / 0.7 g  Rice crispy treat cereal, 1 cup / 0.3 g  Instant oatmeal (cooked),  cup / 2 g  Frosted wheat cereal, 1 cup / 5.1 g  Brown, long-grain rice (cooked), 1 cup / 3.5 g  White, long-grain rice (cooked), 1 cup / 0.6 g  Enriched macaroni (cooked), 1 cup / 2.5 g Legumes / Dietary Fiber (g)  Baked beans (canned, plain, or vegetarian),  cup / 5.2 g  Kidney beans (canned),  cup / 6.8 g  Pinto beans (cooked),  cup / 5.5  g Breads and Crackers / Dietary Fiber (g)  Plain or honey graham crackers, 2 squares / 0.7 g  Saltine crackers, 3 squares / 0.3 g  Plain, salted pretzels, 10 pieces / 1.8 g  Whole-wheat bread, 1 slice / 1.9 g  White bread, 1 slice / 0.7 g  Raisin bread, 1 slice / 1.2 g  Plain bagel, 3 oz / 2 g  Flour tortilla, 1 oz / 0.9 g  Corn tortilla, 1 small / 1.5 g  Hamburger or hotdog bun, 1 small / 0.9 g Fruits / Dietary Fiber (g)  Apple with skin, 1 medium / 4.4 g  Sweetened applesauce,  cup / 1.5 g  Banana,  medium / 1.5 g  Grapes, 10 grapes / 0.4 g  Orange, 1 small / 2.3 g  Raisin, 1.5 oz / 1.6 g  Melon, 1 cup / 1.4 g Vegetables / Dietary Fiber (g)  Green beans (canned),  cup / 1.3 g  Carrots (cooked),  cup / 2.3 g  Broccoli (cooked),  cup / 2.8 g  Peas (cooked),  cup / 4.4 g  Mashed potatoes,  cup / 1.6 g  Lettuce, 1 cup / 0.5 g  Corn (  canned),  cup / 1.6 g  Tomato,  cup / 1.1 g Document Released: 12/08/2005 Document Revised: 06/08/2012 Document Reviewed: 03/11/2012 [facility] Patient Information 2014 [place].

## 2014-04-06 NOTE — Op Note (Signed)
COLONOSCOPY PROCEDURE REPORT  PATIENT:  Adrienne Nelson  MR#:  [id number] Birthdate:  10-Jun-1951, 63 y.o., female Endoscopist:  Dr. Rogene Houston, MD Referred By:  Dr. Tula Nakayama, MD  Procedure Date: 04/06/2014  Procedure:   Colonoscopy  Indications:  Patient is a 63 year old Caucasian female with history of ulcerative colitis in remission, history of colonic polyps and family history of colon carcinoma. Patient is here for surveillance colonoscopy.  Informed Consent:  The procedure and risks were reviewed with the patient and informed consent was obtained.  Medications:  Demerol 50 mg IV Versed 5 mg IV  Description of procedure:  After a digital rectal exam was performed, that colonoscope was advanced from the anus through the rectum and colon to the area of the cecum, ileocecal valve and appendiceal orifice. The cecum was deeply intubated. These structures were well-seen and photographed for the record. From the level of the cecum and ileocecal valve, the scope was slowly and cautiously withdrawn. The mucosal surfaces were carefully surveyed utilizing scope tip to flexion to facilitate fold flattening as needed. The scope was pulled down into the rectum where a thorough exam including retroflexion was performed.  Findings:   Prep satisfactory. Few small scattered diverticula throughout the colon. 4 mm rectal polyp cold snared. Normal anorectal junction.   Therapeutic/Diagnostic Maneuvers Performed:  See above  Complications:  None  Cecal Withdrawal Time:  13 minutes  Impression:  Examination performed to cecum. Pancolonic diverticulosis(few small diverticula scattered throughout the colon). Colitis remains in remission. Small cold snared from rectum.  Recommendations:  Standard instructions given. High fiber diet. I will contact patient with biopsy results and further recommendations.  Rogene Houston  04/06/2014 9:10 AM  CC: Dr. Tula Nakayama, MD & Dr. Rayne Du  ref. provider found

## 2014-04-10 ENCOUNTER — Encounter (HOSPITAL_COMMUNITY): Payer: Self-pay | Admitting: Internal Medicine

## 2014-04-11 ENCOUNTER — Encounter (INDEPENDENT_AMBULATORY_CARE_PROVIDER_SITE_OTHER): Payer: Self-pay | Admitting: *Deleted

## 2014-04-15 ENCOUNTER — Other Ambulatory Visit: Payer: Self-pay | Admitting: Family Medicine

## 2014-04-17 DIAGNOSIS — IMO0002 Reserved for concepts with insufficient information to code with codable children: Secondary | ICD-10-CM | POA: Diagnosis not present

## 2014-04-17 DIAGNOSIS — M1710 Unilateral primary osteoarthritis, unspecified knee: Secondary | ICD-10-CM | POA: Diagnosis not present

## 2014-04-19 DIAGNOSIS — M1710 Unilateral primary osteoarthritis, unspecified knee: Secondary | ICD-10-CM | POA: Diagnosis not present

## 2014-04-19 DIAGNOSIS — M47812 Spondylosis without myelopathy or radiculopathy, cervical region: Secondary | ICD-10-CM | POA: Diagnosis not present

## 2014-04-19 DIAGNOSIS — M47817 Spondylosis without myelopathy or radiculopathy, lumbosacral region: Secondary | ICD-10-CM | POA: Diagnosis not present

## 2014-04-19 DIAGNOSIS — M5137 Other intervertebral disc degeneration, lumbosacral region: Secondary | ICD-10-CM | POA: Diagnosis not present

## 2014-04-19 DIAGNOSIS — IMO0002 Reserved for concepts with insufficient information to code with codable children: Secondary | ICD-10-CM | POA: Diagnosis not present

## 2014-04-21 ENCOUNTER — Other Ambulatory Visit: Payer: Self-pay | Admitting: Family Medicine

## 2014-04-21 DIAGNOSIS — E785 Hyperlipidemia, unspecified: Secondary | ICD-10-CM | POA: Diagnosis not present

## 2014-04-21 DIAGNOSIS — I10 Essential (primary) hypertension: Secondary | ICD-10-CM | POA: Diagnosis not present

## 2014-04-21 DIAGNOSIS — R7309 Other abnormal glucose: Secondary | ICD-10-CM | POA: Diagnosis not present

## 2014-04-21 LAB — BASIC METABOLIC PANEL
BUN: 11 mg/dL (ref 6–23)
CO2: 35 mEq/L — ABNORMAL HIGH (ref 19–32)
Calcium: 9.7 mg/dL (ref 8.4–10.5)
Chloride: 103 mEq/L (ref 96–112)
Creat: 0.71 mg/dL (ref 0.50–1.10)
Glucose, Bld: 89 mg/dL (ref 70–99)
Potassium: 4.6 mEq/L (ref 3.5–5.3)
Sodium: 142 mEq/L (ref 135–145)

## 2014-04-21 LAB — HEMOGLOBIN A1C
Hgb A1c MFr Bld: 6.1 % — ABNORMAL HIGH (ref <5.7)
Mean Plasma Glucose: 128 mg/dL — ABNORMAL HIGH (ref <117)

## 2014-04-25 ENCOUNTER — Encounter: Payer: Self-pay | Admitting: Family Medicine

## 2014-04-25 ENCOUNTER — Ambulatory Visit (INDEPENDENT_AMBULATORY_CARE_PROVIDER_SITE_OTHER): Payer: MEDICARE | Admitting: Family Medicine

## 2014-04-25 VITALS — BP 130/84 | HR 74 | Resp 16 | Wt 231.0 lb

## 2014-04-25 DIAGNOSIS — I10 Essential (primary) hypertension: Secondary | ICD-10-CM

## 2014-04-25 DIAGNOSIS — J449 Chronic obstructive pulmonary disease, unspecified: Secondary | ICD-10-CM

## 2014-04-25 DIAGNOSIS — F3289 Other specified depressive episodes: Secondary | ICD-10-CM

## 2014-04-25 DIAGNOSIS — IMO0002 Reserved for concepts with insufficient information to code with codable children: Secondary | ICD-10-CM

## 2014-04-25 DIAGNOSIS — N3090 Cystitis, unspecified without hematuria: Secondary | ICD-10-CM

## 2014-04-25 DIAGNOSIS — M1710 Unilateral primary osteoarthritis, unspecified knee: Secondary | ICD-10-CM

## 2014-04-25 DIAGNOSIS — E669 Obesity, unspecified: Secondary | ICD-10-CM

## 2014-04-25 DIAGNOSIS — Z01818 Encounter for other preprocedural examination: Secondary | ICD-10-CM | POA: Diagnosis not present

## 2014-04-25 DIAGNOSIS — R7303 Prediabetes: Secondary | ICD-10-CM

## 2014-04-25 DIAGNOSIS — R053 Chronic cough: Secondary | ICD-10-CM

## 2014-04-25 DIAGNOSIS — R05 Cough: Secondary | ICD-10-CM

## 2014-04-25 DIAGNOSIS — F329 Major depressive disorder, single episode, unspecified: Secondary | ICD-10-CM

## 2014-04-25 DIAGNOSIS — E785 Hyperlipidemia, unspecified: Secondary | ICD-10-CM

## 2014-04-25 DIAGNOSIS — M545 Low back pain, unspecified: Secondary | ICD-10-CM

## 2014-04-25 DIAGNOSIS — F17200 Nicotine dependence, unspecified, uncomplicated: Secondary | ICD-10-CM | POA: Diagnosis not present

## 2014-04-25 DIAGNOSIS — R7309 Other abnormal glucose: Secondary | ICD-10-CM

## 2014-04-25 DIAGNOSIS — F32A Depression, unspecified: Secondary | ICD-10-CM

## 2014-04-25 DIAGNOSIS — R059 Cough, unspecified: Secondary | ICD-10-CM

## 2014-04-25 LAB — POCT URINALYSIS DIPSTICK
Bilirubin, UA: NEGATIVE
GLUCOSE UA: NEGATIVE
KETONES UA: NEGATIVE
LEUKOCYTES UA: NEGATIVE
Nitrite, UA: NEGATIVE
Protein, UA: NEGATIVE
Spec Grav, UA: >=1.03
Urobilinogen, UA: 0.2
pH, UA: 5.5

## 2014-04-25 LAB — HEPATIC FUNCTION PANEL
ALBUMIN: 4 g/dL (ref 3.5–5.2)
ALT: 18 U/L (ref 0–35)
AST: 19 U/L (ref 0–37)
Alkaline Phosphatase: 42 U/L (ref 39–117)
Bilirubin, Direct: 0.1 mg/dL (ref 0.0–0.3)
Indirect Bilirubin: 0.2 mg/dL (ref 0.2–1.2)
Total Bilirubin: 0.3 mg/dL (ref 0.2–1.2)
Total Protein: 6.2 g/dL (ref 6.0–8.3)

## 2014-04-25 LAB — LIPID PANEL
CHOLESTEROL: 166 mg/dL (ref 0–200)
HDL: 73 mg/dL (ref >39)
LDL CALC: 60 mg/dL (ref 0–99)
Total CHOL/HDL Ratio: 2.3 Ratio
Triglycerides: 163 mg/dL — ABNORMAL HIGH (ref <150)
VLDL: 33 mg/dL (ref 0–40)

## 2014-04-25 LAB — CREATININE WITH EST GFR
Creat: 0.71 mg/dL (ref 0.50–1.10)
GFR, Est African American: >89 mL/min
GFR, Est Non African American: >89 mL/min

## 2014-04-25 MED ORDER — SIMVASTATIN 40 MG PO TABS: ORAL_TABLET | ORAL | Status: DC

## 2014-04-25 MED ORDER — HYDROCODONE-ACETAMINOPHEN 10-325 MG PO TABS: ORAL_TABLET | ORAL | Status: DC

## 2014-04-25 MED ORDER — LISINOPRIL 40 MG PO TABS: ORAL_TABLET | ORAL | Status: DC

## 2014-04-25 MED ORDER — FLUOXETINE HCL 40 MG PO CAPS: 40.0000 mg | ORAL_CAPSULE | Freq: Every day | ORAL | Status: DC

## 2014-04-25 MED ORDER — FLUTICASONE PROPIONATE 50 MCG/ACT NA SUSP: 2.0000 | Freq: Every day | NASAL | Status: DC

## 2014-04-25 MED ORDER — POTASSIUM CHLORIDE CRYS ER 20 MEQ PO TBCR: EXTENDED_RELEASE_TABLET | ORAL | Status: DC

## 2014-04-25 NOTE — Patient Instructions (Addendum)
F/u in 4 month, call if you need me before  You are medically cleared for surgery   You are referred for a chest scan  Please limit nicotine to 7 per day in May, three per day in June t, then quit by July, need to stop to lower potential problems with surgery  Call for referral to GI and pain specialist  If and when you decide you need this  Dose of pain medication increased today per your request and need    You do need a sleep study , I hope you are able to get this in the near future  HBA1C, chem 7 in 4 months, non fast, before visit

## 2014-04-25 NOTE — Progress Notes (Signed)
Subjective:    Patient ID: Adrienne Nelson, female    DOB: Dec 11, 1951, 63 y.o.   MRN: [id number]  HPI Pt in for surgical clearance for right knee replacement at [facility] in August by Dr Alphonzo Cruise. C/o abdominal bloating in early afternoon which prevents her from eating much in the afternoons for months, of note , she has not lost weight, she has an established dx of IBS which will cause this symptom. She asked specifically if this was celiac disease, I recommended Gi eval for this as I am unable to specifically address this, but she is choosing to hold on this Has received her wheelchair , useful in the home, but needs a ramp built, but financially strapped  Pain is uncontrolled, is to get epidural for chronic left pain when able requests dose increase in pain meds , but states gabapentin just makes her sleep   Review of Systems See HPI Denies recent fever or chills. Denies sinus pressure, nasal congestion, ear pain or sore throat.Post nasal drainage, clear, no fever or chills Denies chest congestion, productive cough chronic, wheezing started smoking at 44 smoked on avg over 1 PPD, and up to 2 PPD Denies chest pains, palpitations and leg swelling, denies PND , orthopnea, has exertional dyspnea Fluctautes between loose and watery stool. No nausea or vomit , this is chronic, she a has mainly loose stools . C/o increased nausea and bloating in last ,month Denies dysuria, frequency, hesitancy, she does have incontinence. Chronic joint pain and limitation in mobility. Denies headaches, seizures, chronic  numbness, and  Tingling.in right hand since shingles Denies uncontrolled depression, anxiety or insomnia. Denies skin break down or rash.        Objective:   Physical Exam  BP 130/84  Pulse 74  Resp 16  Wt 231 lb (104.781 kg)  SpO2 89% HYPERTENSION Controlled, no change in medication DASH diet and commitment to daily physical activity for a minimum of 30 minutes discussed and  encouraged, as a part of hypertension management. The importance of attaining a healthy weight is also discussed.   Prediabetes Deteriorated. Patient educated about the importance of limiting  Carbohydrate intake , the need to commit to daily physical activity for a minimum of 30 minutes , and to commit weight loss. The fact that changes in all these areas will reduce or eliminate all together the development of diabetes is stressed.     OBESITY Deteriorated. Patient re-educated about  the importance of commitment to a  minimum of 150 minutes of exercise per week. The importance of healthy food choices with portion control discussed. Encouraged to start a food diary, count calories and to consider  joining a support group. Sample diet sheets offered. Goals set by the patient for the next several months.     HYPERLIPIDEMIA Elevated TG otherwise at goal Hyperlipidemia:Low fat diet discussed and encouraged.  Updated lab needed at/ before next visit. Needs to lower fat intake   Depression Sub optimal control, but mainly circumstantial due to chronic pain and inability to adequately care herself and increased dependence on help from her sibling who herself is not well Pt not suicidal or homicidal  Nicotine dependence Ongoing  Use and unable to set a quit date, advantage of and need to do this now in prep for surgery is stressed Patient counseled for approximately 5 minutes regarding the health risks of ongoing nicotine use, specifically all types of cancer, heart disease, stroke and respiratory failure. The options available for help with  cessation ,the behavioral changes to assist the process, and the option to either gradully reduce usage  Or abruptly stop.is also discussed. Pt is also encouraged to set specific goals in number of cigarettes used daily, as well as to set a quit date.   Lumbar back pain Pt contemplating switching pain management  to pain specialist, has had  epidural there, but her current chronic pain meds through this office are inadequate , I strongly encourage her to have a specialist in pain treat her , she is still contemplating this, financial restraints are the stated obstacle  ARTHRITIS, RIGHT KNEE Planned surghery in near future here for medical clearance primarily  COPD (chronic obstructive pulmonary disease) with chronic bronchitis Progressive disease due to ongoing nicotine use, chest CT to r/o lung mass         Assessment & Plan:

## 2014-04-26 LAB — URINALYSIS W MICROSCOPIC + REFLEX CULTURE
Bacteria, UA: NONE SEEN
Bilirubin Urine: NEGATIVE
GLUCOSE, UA: NEGATIVE mg/dL
Hgb urine dipstick: NEGATIVE
Ketones, ur: NEGATIVE mg/dL
LEUKOCYTES UA: NEGATIVE
NITRITE: NEGATIVE
PH: 5.5 (ref 5.0–8.0)
Protein, ur: NEGATIVE mg/dL
Specific Gravity, Urine: 1.021 (ref 1.005–1.030)
Urobilinogen, UA: 0.2 mg/dL (ref 0.0–1.0)

## 2014-04-28 ENCOUNTER — Encounter (HOSPITAL_COMMUNITY): Payer: Self-pay

## 2014-04-28 ENCOUNTER — Ambulatory Visit (HOSPITAL_COMMUNITY)
Admission: RE | Admit: 2014-04-28 | Discharge: 2014-04-28 | Disposition: A | Discharge status: Home / Self Care | Payer: MEDICARE | Source: Ambulatory Visit | Attending: Family Medicine | Admitting: Family Medicine

## 2014-04-28 DIAGNOSIS — J984 Other disorders of lung: Secondary | ICD-10-CM | POA: Insufficient documentation

## 2014-04-28 DIAGNOSIS — K7689 Other specified diseases of liver: Secondary | ICD-10-CM | POA: Insufficient documentation

## 2014-04-28 DIAGNOSIS — Z85819 Personal history of malignant neoplasm of unspecified site of lip, oral cavity, and pharynx: Secondary | ICD-10-CM | POA: Insufficient documentation

## 2014-04-28 DIAGNOSIS — J40 Bronchitis, not specified as acute or chronic: Secondary | ICD-10-CM | POA: Diagnosis not present

## 2014-04-28 DIAGNOSIS — F17200 Nicotine dependence, unspecified, uncomplicated: Secondary | ICD-10-CM

## 2014-04-28 DIAGNOSIS — J449 Chronic obstructive pulmonary disease, unspecified: Secondary | ICD-10-CM

## 2014-04-28 MED ORDER — IOHEXOL 300 MG/ML  SOLN
80.0000 mL | Freq: Once | INTRAMUSCULAR | Status: AC | PRN
  Administered 2014-04-28: 80 mL via INTRAVENOUS

## 2014-05-01 ENCOUNTER — Telehealth: Payer: Self-pay | Admitting: Family Medicine

## 2014-05-01 NOTE — Telephone Encounter (Signed)
Spoke with patient who states that she has serous drainage coming from right leg.  There is redness from the calf down.  She does not notice and warmth or swelling and it is causing no pain.  Drainage is continuous.  Please advise.  States that she also has a red area on abdomin that looks the same but is not draining.

## 2014-05-02 MED ORDER — LEVOFLOXACIN 500 MG PO TABS: 500.0000 mg | ORAL_TABLET | Freq: Every day | ORAL | Status: DC

## 2014-05-02 NOTE — Telephone Encounter (Signed)
Patient aware and med sent to requested pharmacy.  

## 2014-05-02 NOTE — Addendum Note (Signed)
Addended by: Denman George B on: 05/02/2014 11:31 AM   Modules accepted: Orders

## 2014-05-02 NOTE — Telephone Encounter (Signed)
pls erx levaquin 500mg  one daily #7 if worsening in next  1 to  2 days needs to go to ED

## 2014-05-10 ENCOUNTER — Emergency Department (HOSPITAL_COMMUNITY): Payer: MEDICARE

## 2014-05-10 ENCOUNTER — Emergency Department (HOSPITAL_COMMUNITY)
Admission: EM | Admit: 2014-05-10 | Discharge: 2014-05-10 | Disposition: A | Discharge status: Home / Self Care | Payer: MEDICARE | Attending: Emergency Medicine | Admitting: Emergency Medicine

## 2014-05-10 ENCOUNTER — Encounter (HOSPITAL_COMMUNITY): Payer: Self-pay | Admitting: Emergency Medicine

## 2014-05-10 ENCOUNTER — Telehealth: Payer: Self-pay

## 2014-05-10 DIAGNOSIS — F329 Major depressive disorder, single episode, unspecified: Secondary | ICD-10-CM | POA: Diagnosis not present

## 2014-05-10 DIAGNOSIS — H902 Conductive hearing loss, unspecified: Secondary | ICD-10-CM | POA: Diagnosis not present

## 2014-05-10 DIAGNOSIS — Z8719 Personal history of other diseases of the digestive system: Secondary | ICD-10-CM | POA: Insufficient documentation

## 2014-05-10 DIAGNOSIS — L03119 Cellulitis of unspecified part of limb: Secondary | ICD-10-CM | POA: Diagnosis not present

## 2014-05-10 DIAGNOSIS — Z79899 Other long term (current) drug therapy: Secondary | ICD-10-CM | POA: Insufficient documentation

## 2014-05-10 DIAGNOSIS — I10 Essential (primary) hypertension: Secondary | ICD-10-CM | POA: Insufficient documentation

## 2014-05-10 DIAGNOSIS — F17200 Nicotine dependence, unspecified, uncomplicated: Secondary | ICD-10-CM | POA: Insufficient documentation

## 2014-05-10 DIAGNOSIS — E119 Type 2 diabetes mellitus without complications: Secondary | ICD-10-CM | POA: Diagnosis not present

## 2014-05-10 DIAGNOSIS — R109 Unspecified abdominal pain: Secondary | ICD-10-CM | POA: Insufficient documentation

## 2014-05-10 DIAGNOSIS — R911 Solitary pulmonary nodule: Secondary | ICD-10-CM | POA: Diagnosis not present

## 2014-05-10 DIAGNOSIS — E7800 Pure hypercholesterolemia, unspecified: Secondary | ICD-10-CM | POA: Diagnosis not present

## 2014-05-10 DIAGNOSIS — M1710 Unilateral primary osteoarthritis, unspecified knee: Secondary | ICD-10-CM | POA: Diagnosis not present

## 2014-05-10 DIAGNOSIS — Z85819 Personal history of malignant neoplasm of unspecified site of lip, oral cavity, and pharynx: Secondary | ICD-10-CM | POA: Diagnosis not present

## 2014-05-10 DIAGNOSIS — R0602 Shortness of breath: Secondary | ICD-10-CM | POA: Insufficient documentation

## 2014-05-10 DIAGNOSIS — Z7982 Long term (current) use of aspirin: Secondary | ICD-10-CM | POA: Diagnosis not present

## 2014-05-10 DIAGNOSIS — E785 Hyperlipidemia, unspecified: Secondary | ICD-10-CM | POA: Insufficient documentation

## 2014-05-10 DIAGNOSIS — L03115 Cellulitis of right lower limb: Secondary | ICD-10-CM

## 2014-05-10 DIAGNOSIS — F3289 Other specified depressive episodes: Secondary | ICD-10-CM | POA: Insufficient documentation

## 2014-05-10 DIAGNOSIS — M129 Arthropathy, unspecified: Secondary | ICD-10-CM | POA: Insufficient documentation

## 2014-05-10 DIAGNOSIS — L03116 Cellulitis of left lower limb: Secondary | ICD-10-CM

## 2014-05-10 DIAGNOSIS — L02419 Cutaneous abscess of limb, unspecified: Secondary | ICD-10-CM | POA: Diagnosis not present

## 2014-05-10 DIAGNOSIS — E669 Obesity, unspecified: Secondary | ICD-10-CM | POA: Diagnosis not present

## 2014-05-10 DIAGNOSIS — Z880 Allergy status to penicillin: Secondary | ICD-10-CM | POA: Insufficient documentation

## 2014-05-10 DIAGNOSIS — IMO0002 Reserved for concepts with insufficient information to code with codable children: Secondary | ICD-10-CM

## 2014-05-10 LAB — CBC WITH DIFFERENTIAL/PLATELET
Basophils Absolute: 0.1 10*3/uL (ref 0.0–0.1)
Basophils Relative: 1 % (ref 0–1)
EOS ABS: 1 10*3/uL — AB (ref 0.0–0.7)
EOS PCT: 14 % — AB (ref 0–5)
HEMATOCRIT: 44.2 % (ref 36.0–46.0)
HEMOGLOBIN: 14.7 g/dL (ref 12.0–15.0)
LYMPHS PCT: 26 % (ref 12–46)
Lymphs Abs: 2 10*3/uL (ref 0.7–4.0)
MCH: 30.2 pg (ref 26.0–34.0)
MCHC: 33.3 g/dL (ref 30.0–36.0)
MCV: 90.9 fL (ref 78.0–100.0)
MONO ABS: 0.6 10*3/uL (ref 0.1–1.0)
MONOS PCT: 8 % (ref 3–12)
Neutro Abs: 3.8 10*3/uL (ref 1.7–7.7)
Neutrophils Relative %: 51 % (ref 43–77)
PLATELETS: 241 10*3/uL (ref 150–400)
RBC: 4.86 MIL/uL (ref 3.87–5.11)
RDW: 13.7 % (ref 11.5–15.5)
WBC: 7.5 10*3/uL (ref 4.0–10.5)

## 2014-05-10 LAB — COMPREHENSIVE METABOLIC PANEL
ALT: 13 U/L (ref 0–35)
AST: 21 U/L (ref 0–37)
Albumin: 4 g/dL (ref 3.5–5.2)
Alkaline Phosphatase: 53 U/L (ref 39–117)
BUN: 7 mg/dL (ref 6–23)
CALCIUM: 9.8 mg/dL (ref 8.4–10.5)
CO2: 29 meq/L (ref 19–32)
Chloride: 101 mEq/L (ref 96–112)
Creatinine, Ser: 0.7 mg/dL (ref 0.50–1.10)
GFR, EST NON AFRICAN AMERICAN: 90 mL/min — AB (ref >90)
GLUCOSE: 115 mg/dL — AB (ref 70–99)
Potassium: 4.2 mEq/L (ref 3.7–5.3)
SODIUM: 142 meq/L (ref 137–147)
TOTAL PROTEIN: 7.2 g/dL (ref 6.0–8.3)
Total Bilirubin: 0.2 mg/dL — ABNORMAL LOW (ref 0.3–1.2)

## 2014-05-10 LAB — D-DIMER, QUANTITATIVE: D-Dimer, Quant: 0.82 ug/mL-FEU — ABNORMAL HIGH (ref 0.00–0.48)

## 2014-05-10 LAB — PRO B NATRIURETIC PEPTIDE: PRO B NATRI PEPTIDE: 208.1 pg/mL — AB (ref 0–125)

## 2014-05-10 MED ORDER — SULFAMETHOXAZOLE-TRIMETHOPRIM 800-160 MG PO TABS: 1.0000 | ORAL_TABLET | Freq: Two times a day (BID) | ORAL | Status: DC

## 2014-05-10 MED ORDER — IOHEXOL 350 MG/ML SOLN
100.0000 mL | Freq: Once | INTRAVENOUS | Status: AC | PRN
  Administered 2014-05-10: 100 mL via INTRAVENOUS

## 2014-05-10 MED ORDER — ENOXAPARIN SODIUM 100 MG/ML ~~LOC~~ SOLN
150.0000 mg | Freq: Once | SUBCUTANEOUS | Status: AC
Start: 2014-05-10 — End: 2014-05-10
  Administered 2014-05-10: 150 mg via SUBCUTANEOUS
  Filled 2014-05-10: qty 2

## 2014-05-10 MED ORDER — VANCOMYCIN HCL IN DEXTROSE 1-5 GM/200ML-% IV SOLN
1000.0000 mg | Freq: Once | INTRAVENOUS | Status: AC
  Administered 2014-05-10: 1000 mg via INTRAVENOUS
  Filled 2014-05-10: qty 200

## 2014-05-10 NOTE — Telephone Encounter (Signed)
  Pls explain she needs to go to the Ed, I placed her on an excellent  antibiotic which, if there were no complications should have cured the infection, she is calling now stating worse, she needs to have more evaluation and  management than I can do in the office

## 2014-05-10 NOTE — ED Notes (Signed)
Pt from home. Pt states her rt leg was swollen followed by her lt leg. MD called in antibiotics without any relief. Pt also c/o SOB. Pt denies chest pain at this time. Legs appear to be red and swollen bilaterally.

## 2014-05-10 NOTE — ED Provider Notes (Addendum)
CSN: [id number]     Arrival date & time 05/10/14  1727 History   First MD Initiated Contact with Patient 05/10/14 1850     Chief Complaint  Patient presents with  . Shortness of Breath  . Cellulitis     (Consider location/radiation/quality/duration/timing/severity/associated sxs/prior Treatment) HPI Patient reports about 2 weeks ago she started getting redness on the top of her right ankle that spread up to her right knee. She states she called her PCPs office and she was prescribed Levaquin filled on 5/12 which she took for 7 days. She reports however the redness did not improve. She states actually the redness is spreading and she also is getting more swelling. She also states in the last couple days she started getting redness and swelling in the left lower leg. She denies any itching. She denies fever or chills. She denies chest pain but started having shortness of breath a few nights ago. She states when she lays down she feels like her heart races and she has to sit up to breathe. She denies any diaphoresis. She denies wheezing but her sister states she has her wheezing. She states several years ago she had a mild infection in her left leg after which she had some brown discoloration to her skin. She states her skin feels tight and a couple days ago she nicked the skin on one of her legs and some clear drainage came out of it.  Patient states she needs a total knee replacement to be done in August by Dr. Vanita Panda. She states she is having extreme difficulty ambulating.   PCP Dr Bari Mantis  Past Medical History  Diagnosis Date  . Hand pain     bilateral    . Arthritis of knee, right   . Throat cancer   . Depression   . Hearing loss   . Ulcerative colitis   . Hyperlipidemia   . Type 2 diabetes mellitus without complications   . Hypertension   . Hypercholesteremia    Past Surgical History  Procedure Laterality Date  . Cesarean section    . Cesarean section    . Carpal tunnel  release    . Cholecystectomy    . Total abdominal hysterectomy    . Left trigger finger surgery  Dec. 2011  . Colonoscopy N/A 04/06/2014    Procedure: COLONOSCOPY;  Surgeon: Rogene Houston, MD;  Location: [facility];  Service: Endoscopy;  Laterality: N/A;  33   Family History  Problem Relation Age of Onset  . Lung cancer Mother   . Colon cancer Father    History  Substance Use Topics  . Smoking status: Current Every Day Smoker -- 0.50 packs/day for 20 years    Types: Cigarettes  . Smokeless tobacco: Not on file  . Alcohol Use: No   Lives with sister   OB History   Grav Para Term Preterm Abortions TAB SAB Ect Mult Living                 Review of Systems  All other systems reviewed and are negative.     Allergies  Penicillins and Ibuprofen  Home Medications   Prior to Admission medications   Medication Sig Start Date End Date Taking? Authorizing Provider  aspirin (ASPIRIN LOW DOSE) 81 MG EC tablet Take 81 mg by mouth daily.      Historical Provider, MD  Calcium Carbonate-Vitamin D (CALCIUM 600+D) 600-400 MG-UNIT per tablet Take 1 tablet by mouth 3 (three) times  daily with meals.      Historical Provider, MD  cloNIDine (CATAPRES) 0.1 MG tablet TAKE ONE TABLET BY MOUTH AT BEDTIME 03/29/14   Fayrene Helper, MD  FLUoxetine (PROZAC) 40 MG capsule Take 1 capsule (40 mg total) by mouth daily. 04/25/14   Fayrene Helper, MD  furosemide (LASIX) 20 MG tablet TAKE ONE TABLET BY MOUTH ONCE DAILY 03/29/14   Fayrene Helper, MD  gabapentin (NEURONTIN) 600 MG tablet Take 1 tablet (600 mg total) by mouth 2 (two) times daily. 01/09/14   Fayrene Helper, MD  HYDROcodone-acetaminophen [facility]) 10-325 MG per tablet One tablet twice daily 04/25/14   Fayrene Helper, MD  lisinopril (PRINIVIL,ZESTRIL) 40 MG tablet TAKE ONE TABLET BY MOUTH ONCE DAILY 04/25/14   Fayrene Helper, MD  metFORMIN (GLUCOPHAGE) 500 MG tablet Take 1 tablet (500 mg total) by mouth daily with breakfast.  01/25/14   Fayrene Helper, MD  Multiple Vitamin (MULTIVITAMIN) tablet Take 1 tablet by mouth daily.      Historical Provider, MD  potassium chloride SA (KLOR-CON M20) 20 MEQ tablet TAKE ONE TABLET BY MOUTH TWICE DAILY 04/25/14   Fayrene Helper, MD  simvastatin (ZOCOR) 40 MG tablet TAKE ONE TABLET BY MOUTH AT BEDTIME 04/25/14   Fayrene Helper, MD  traZODone (DESYREL) 100 MG tablet TAKE THREE TABLETS BY MOUTH AT BEDTIME    Fayrene Helper, MD   BP 171/97  Pulse 81  Temp(Src) 99 F (37.2 C) (Oral)  Ht 4\' 11"  (1.499 m)  Wt 229 lb (103.874 kg)  BMI 46.23 kg/m2  SpO2 92%  Vital signs normal except hypertension  Physical Exam  Nursing note and vitals reviewed. Constitutional: She is oriented to person, place, and time. She appears well-developed and well-nourished.  Non-toxic appearance. She does not appear ill. No distress.  obese  HENT:  Head: Normocephalic and atraumatic.  Right Ear: External ear normal.  Left Ear: External ear normal.  Nose: Nose normal. No mucosal edema or rhinorrhea.  Mouth/Throat: Oropharynx is clear and moist and mucous membranes are normal. No dental abscesses or uvula swelling.  Eyes: Conjunctivae and EOM are normal. Pupils are equal, round, and reactive to light.  Neck: Normal range of motion and full passive range of motion without pain. Neck supple.  Cardiovascular: Normal rate, regular rhythm and normal heart sounds.  Exam reveals no gallop and no friction rub.   No murmur heard. Pulmonary/Chest: Effort normal. No respiratory distress. She has decreased breath sounds. She has no wheezes. She has no rhonchi. She has no rales. She exhibits no tenderness and no crepitus.  Abdominal: Soft. Normal appearance and bowel sounds are normal. She exhibits no distension. There is tenderness. There is no rebound and no guarding.  Patient has mild diffuse redness and warmth to her lower abdomen without open lesions  Musculoskeletal: Normal range of motion. She  exhibits edema and tenderness.  Moves all extremities well.  Patient's noted to have some diffuse redness with mild warmth of her right anterior lower leg from her ankle almost up to her knee. There are no open wounds at this time.  She has less redness and warmth of her left lower leg mainly anteriorly. Again no open wounds seen.  Neurological: She is alert and oriented to person, place, and time. She has normal strength. No cranial nerve deficit.  Skin: Skin is warm, dry and intact. No rash noted. No erythema. No pallor.  Psychiatric: She has a normal mood and affect.  Her speech is normal and behavior is normal. Her mood appears not anxious.         ED Course  Procedures (including critical care time)  Medications  vancomycin (VANCOCIN) IVPB 1000 mg/200 mL premix (0 mg Intravenous Stopped 05/10/14 2107)  iohexol (OMNIPAQUE) 350 MG/ML injection 100 mL (100 mLs Intravenous Contrast Given 05/10/14 2113)  enoxaparin (LOVENOX) injection 150 mg (150 mg Subcutaneous Given 05/10/14 2240)    2030 p.m. discussed results of her blood work. Discussed need to get CT angiogram make sure she does not have a PE. Patient is agreeable.  22:00 Pt given her test results. She wants to discharged home and will return for outpatient doppler US of her legs. She was given lovenox 1.5 mg/kg [place]. If her test is positive the ER doctor tomorrow will arrange for her to be admitted or get appropriate outpatient treatment. If her test is negative she will continue on the  new antibiotic.    Labs Review Results for orders placed during the hospital encounter of 05/10/14  CBC WITH DIFFERENTIAL      Result Value Ref Range   WBC 7.5  4.0 - 10.5 K/uL   RBC 4.86  3.87 - 5.11 MIL/uL   Hemoglobin 14.7  12.0 - 15.0 g/dL   HCT 44.2  36.0 - 46.0 %   MCV 90.9  78.0 - 100.0 fL   MCH 30.2  26.0 - 34.0 pg   MCHC 33.3  30.0 - 36.0 g/dL   RDW 13.7  11.5 - 15.5 %   Platelets 241  150 - 400 K/uL   Neutrophils Relative % 51   43 - 77 %   Neutro Abs 3.8  1.7 - 7.7 K/uL   Lymphocytes Relative 26  12 - 46 %   Lymphs Abs 2.0  0.7 - 4.0 K/uL   Monocytes Relative 8  3 - 12 %   Monocytes Absolute 0.6  0.1 - 1.0 K/uL   Eosinophils Relative 14 (*) 0 - 5 %   Eosinophils Absolute 1.0 (*) 0.0 - 0.7 K/uL   Basophils Relative 1  0 - 1 %   Basophils Absolute 0.1  0.0 - 0.1 K/uL  COMPREHENSIVE METABOLIC PANEL      Result Value Ref Range   Sodium 142  137 - 147 mEq/L   Potassium 4.2  3.7 - 5.3 mEq/L   Chloride 101  96 - 112 mEq/L   CO2 29  19 - 32 mEq/L   Glucose, Bld 115 (*) 70 - 99 mg/dL   BUN 7  6 - 23 mg/dL   Creatinine, Ser 0.70  0.50 - 1.10 mg/dL   Calcium 9.8  8.4 - 10.5 mg/dL   Total Protein 7.2  6.0 - 8.3 g/dL   Albumin 4.0  3.5 - 5.2 g/dL   AST 21  0 - 37 U/L   ALT 13  0 - 35 U/L   Alkaline Phosphatase 53  39 - 117 U/L   Total Bilirubin 0.2 (*) 0.3 - 1.2 mg/dL   GFR calc non Af Amer 90 (*) >90 mL/min   GFR calc Af Amer >90  >90 mL/min  PRO B NATRIURETIC PEPTIDE      Result Value Ref Range   Pro B Natriuretic peptide (BNP) 208.1 (*) 0 - 125 pg/mL  D-DIMER, QUANTITATIVE      Result Value Ref Range   D-Dimer, Quant 0.82 (*) 0.00 - 0.48 ug/mL-FEU   Laboratory interpretation all normal except mild elevation of her d-dimer,  concentrated urine   Ct Chest W Contrast  04/28/2014   CLINICAL DATA:  Cough and shortness of breath. History of throat cancer in 2003.    IMPRESSION: Bronchitic changes. Scarring at the lung bases. Prior granulomatous disease.   Electronically Signed   By: Rozetta Nunnery M.D.   On: 04/28/2014 08:55      Imaging Review Ct Angio Chest W/cm &/or Wo Cm  05/10/2014   CLINICAL DATA:  Shortness of breath, positive D-dimer. Patient has a history of throat cancer with radiation.  EXAM: CT ANGIOGRAPHY CHEST WITH CONTRAST  TECHNIQUE: Multidetector CT imaging of the chest was performed using the standard protocol during bolus administration of intravenous contrast. Multiplanar CT image  reconstructions and MIPs were obtained to evaluate the vascular anatomy.  CONTRAST:  119mL OMNIPAQUE IOHEXOL 350 MG/ML SOLN  COMPARISON:  Apr 28, 2014  FINDINGS: There is no pulmonary embolus. There are calcified lymph nodes within the mediastinum and left hilum. There is no mediastinal or hilar lymphadenopathy. The heart size is normal. There is no pericardial effusion. Images the lung fields demonstrate partial calcified scarring in the lingula unchanged. There is minimal scarring of the right middle lobe unchanged. There is a 2 mm nodule in the lateral right middle lobe unchanged. Patient is status post prior cholecystectomy. There is fatty infiltration of liver. There is a small hiatal hernia.  Review of the MIP images confirms the above findings.  IMPRESSION: No pulmonary embolus. No acute abnormality identified within the chest. Changes of old granulomatous disease.   Electronically Signed   By: Abelardo Diesel M.D.   On: 05/10/2014 21:55     EKG Interpretation   Date/Time:  Wednesday May 10 2014 17:51:37 EDT Ventricular Rate:  85 PR Interval:  174 QRS Duration: 70 QT Interval:  392 QTC Calculation: 466 R Axis:   53 Text Interpretation:  Normal sinus rhythm Normal ECG No significant change  since last tracing Confirmed by Zaniah Titterington  MD-I, Vaidehi Braddy ([id number]) on 05/10/2014  7:43:18 PM      MDM   Final diagnoses:  Cellulitis of both lower extremities    New Prescriptions   SULFAMETHOXAZOLE-TRIMETHOPRIM (SEPTRA DS) 800-160 MG PER TABLET    Take 1 tablet by mouth 2 (two) times daily.    Plan discharge to return tomorrow to get outpatient doppler US of her legs.   Rolland Porter, MD, FACEP     Janice Norrie, MD 05/10/14 [id number]  Janice Norrie, MD 05/10/14 2246

## 2014-05-10 NOTE — Telephone Encounter (Signed)
Needs to go to the Ed , this was sent in previous message that if not getting better needs to go to Ed , she has been hospitalized in the past year for skin infection

## 2014-05-10 NOTE — Discharge Instructions (Signed)
Elevate your legs. Use warmth, but not heat (you don't want to burn your skin) to your legs. Take the antibiotic until gone. Return tomorrow at 1:30 pm and go to  radiology for a 1:45 pm appointment to get your outpatient doppler ultrasound done to make sure you don't have a blood clot in your legs. If you have a blood clot the ED doctor tomorrow will help you with your treatment, if you don't have a blood clot, finish the antibiotics. Return to the ED if you get a high fever, chills, or the redness and swelling get a lot worse.      Cellulitis Cellulitis is an infection of the skin and the tissue beneath it. The infected area is usually red and tender. Cellulitis occurs most often in the arms and lower legs.  CAUSES  Cellulitis is caused by bacteria that enter the skin through cracks or cuts in the skin. The most common types of bacteria that cause cellulitis are Staphylococcus and Streptococcus. SYMPTOMS   Redness and warmth.  Swelling.  Tenderness or pain.  Fever. DIAGNOSIS  Your caregiver can usually determine what is wrong based on a physical exam. Blood tests may also be done. TREATMENT  Treatment usually involves taking an antibiotic medicine. HOME CARE INSTRUCTIONS   Take your antibiotics as directed. Finish them even if you start to feel better.  Keep the infected arm or leg elevated to reduce swelling.  Apply a warm cloth to the affected area up to 4 times per day to relieve pain.  Only take over-the-counter or prescription medicines for pain, discomfort, or fever as directed by your caregiver.  Keep all follow-up appointments as directed by your caregiver. SEEK MEDICAL CARE IF:   You notice red streaks coming from the infected area.  Your red area gets larger or turns dark in color.  Your bone or joint underneath the infected area becomes painful after the skin has healed.  Your infection returns in the same area or another area.  You notice a swollen bump in the  infected area.  You develop new symptoms. SEEK IMMEDIATE MEDICAL CARE IF:   You have a fever.  You feel very sleepy.  You develop vomiting or diarrhea.  You have a general ill feeling (malaise) with muscle aches and pains. MAKE SURE YOU:   Understand these instructions.  Will watch your condition.  Will get help right away if you are not doing well or get worse. Document Released: 09/17/2005 Document Revised: 06/08/2012 Document Reviewed: 02/23/2012 [facility] Patient Information 2014 [place].

## 2014-05-10 NOTE — Telephone Encounter (Signed)
Patient would like to know if she can be seen in the office instead.  Please advise.

## 2014-05-11 ENCOUNTER — Ambulatory Visit (HOSPITAL_COMMUNITY)
Admit: 2014-05-11 | Discharge: 2014-05-11 | Disposition: A | Discharge status: Home / Self Care | Payer: MEDICARE | Source: Ambulatory Visit | Attending: Emergency Medicine | Admitting: Emergency Medicine

## 2014-05-11 DIAGNOSIS — M7989 Other specified soft tissue disorders: Secondary | ICD-10-CM | POA: Insufficient documentation

## 2014-05-11 NOTE — ED Provider Notes (Signed)
I relayed the CT / [place] results to the patient, who was in no distress.  She will f/u w PMD.   Carmin Muskrat, MD 05/11/14 [phone]

## 2014-05-18 ENCOUNTER — Telehealth: Payer: Self-pay

## 2014-05-18 ENCOUNTER — Other Ambulatory Visit: Payer: Self-pay

## 2014-05-18 DIAGNOSIS — M1710 Unilateral primary osteoarthritis, unspecified knee: Secondary | ICD-10-CM

## 2014-05-18 DIAGNOSIS — IMO0002 Reserved for concepts with insufficient information to code with codable children: Principal | ICD-10-CM

## 2014-05-18 DIAGNOSIS — L03119 Cellulitis of unspecified part of limb: Secondary | ICD-10-CM

## 2014-05-18 MED ORDER — HYDROCODONE-ACETAMINOPHEN 10-325 MG PO TABS: ORAL_TABLET | ORAL | Status: DC

## 2014-05-18 NOTE — Telephone Encounter (Signed)
For the past 3 weeks she has been suffering with cellulitis on both legs. Took antibiotic that you gave her and then she went to the ER 5/20 and they gave her septra for 10 days. The antibiotics don't seem to be helping and now she has developed several fluid filled blisters on her lower right leg that are sore. Has a couple days left on septra. Please advise

## 2014-05-19 NOTE — Telephone Encounter (Signed)
pls advise that evaluation by dermatology is needed, Dr Tarri Glenn is in [place] where she lives, and try and setup appt asap if she agrees. Pls verify that she has no fever, chills or pus draining form any part of her skin. If she has any of that and feels otherwise ill, she needs to go to urgent care or Ed today. Please also set up an appt for me to see her  next week (preferably after derm if possible) Let her know I am very concerned about the ongoing skin complaint that she is having ( that's why I believe derm eval will be very beneficial )

## 2014-05-19 NOTE — Telephone Encounter (Signed)
Pt aware- no fever chills or pus draining but will go to UC if needed before appt. Referral entered patient aware

## 2014-05-19 NOTE — Telephone Encounter (Signed)
Pt to call back and schedule appt here after she gets appt for dermatology

## 2014-05-19 NOTE — Addendum Note (Signed)
Addended by: Eual Fines on: 05/19/2014 08:45 AM   Modules accepted: Orders

## 2014-05-23 ENCOUNTER — Encounter: Payer: Self-pay | Admitting: Family Medicine

## 2014-05-24 ENCOUNTER — Other Ambulatory Visit: Payer: Self-pay | Admitting: Family Medicine

## 2014-05-30 DIAGNOSIS — I8310 Varicose veins of unspecified lower extremity with inflammation: Secondary | ICD-10-CM | POA: Diagnosis not present

## 2014-06-04 DIAGNOSIS — Z01818 Encounter for other preprocedural examination: Secondary | ICD-10-CM | POA: Insufficient documentation

## 2014-06-04 NOTE — Assessment & Plan Note (Signed)
Controlled, no change in medication DASH diet and commitment to daily physical activity for a minimum of 30 minutes discussed and encouraged, as a part of hypertension management. The importance of attaining a healthy weight is also discussed.  

## 2014-06-04 NOTE — Assessment & Plan Note (Signed)
Planned surghery in near future here for medical clearance primarily

## 2014-06-04 NOTE — Assessment & Plan Note (Signed)
Progressive disease due to ongoing nicotine use, chest CT to r/o lung mass

## 2014-06-04 NOTE — Assessment & Plan Note (Signed)
Sub optimal control, but mainly circumstantial due to chronic pain and inability to adequately care herself and increased dependence on help from her sibling who herself is not well Pt not suicidal or homicidal

## 2014-06-04 NOTE — Assessment & Plan Note (Signed)
Deteriorated Patient educated about the importance of limiting  Carbohydrate intake , the need to commit to daily physical activity for a minimum of 30 minutes , and to commit weight loss. The fact that changes in all these areas will reduce or eliminate all together the development of diabetes is stressed.    

## 2014-06-04 NOTE — Assessment & Plan Note (Signed)
Deteriorated. Patient re-educated about  the importance of commitment to a  minimum of 150 minutes of exercise per week. The importance of healthy food choices with portion control discussed. Encouraged to start a food diary, count calories and to consider  joining a support group. Sample diet sheets offered. Goals set by the patient for the next several months.    

## 2014-06-04 NOTE — Assessment & Plan Note (Signed)
Elevated TG otherwise at goal Hyperlipidemia:Low fat diet discussed and encouraged.  Updated lab needed at/ before next visit. Needs to lower fat intake

## 2014-06-04 NOTE — Assessment & Plan Note (Signed)
Pt has knee surgery upocoming and is here for pre op medical clearance. No symptoms c/w angina or   of cardiac problems. She has limited mobilty therefore not too much challenge to the cardiopulmonary system on a regular basis EKG i office today shows no signs of ischemia and is nSR

## 2014-06-04 NOTE — Assessment & Plan Note (Addendum)
Pt contemplating switching pain management  to pain specialist, has had epidural there, but her current chronic pain meds through this office are inadequate , I strongly encourage her to have a specialist in pain treat her , she is still contemplating this, financial restraints are the stated obstacle

## 2014-06-04 NOTE — Assessment & Plan Note (Signed)
Ongoing  Use and unable to set a quit date, advantage of and need to do this now in prep for surgery is stressed Patient counseled for approximately 5 minutes regarding the health risks of ongoing nicotine use, specifically all types of cancer, heart disease, stroke and respiratory failure. The options available for help with cessation ,the behavioral changes to assist the process, and the option to either gradully reduce usage  Or abruptly stop.is also discussed. Pt is also encouraged to set specific goals in number of cigarettes used daily, as well as to set a quit date.

## 2014-06-21 ENCOUNTER — Other Ambulatory Visit: Payer: Self-pay

## 2014-06-21 DIAGNOSIS — IMO0002 Reserved for concepts with insufficient information to code with codable children: Principal | ICD-10-CM

## 2014-06-21 DIAGNOSIS — M1710 Unilateral primary osteoarthritis, unspecified knee: Secondary | ICD-10-CM

## 2014-06-21 MED ORDER — HYDROCODONE-ACETAMINOPHEN 10-325 MG PO TABS: ORAL_TABLET | ORAL | Status: DC

## 2014-06-23 ENCOUNTER — Other Ambulatory Visit: Payer: Self-pay | Admitting: Family Medicine

## 2014-07-07 DIAGNOSIS — Z87891 Personal history of nicotine dependence: Secondary | ICD-10-CM | POA: Diagnosis not present

## 2014-07-07 DIAGNOSIS — Z880 Allergy status to penicillin: Secondary | ICD-10-CM | POA: Diagnosis not present

## 2014-07-07 DIAGNOSIS — M47812 Spondylosis without myelopathy or radiculopathy, cervical region: Secondary | ICD-10-CM | POA: Diagnosis not present

## 2014-07-07 DIAGNOSIS — M5137 Other intervertebral disc degeneration, lumbosacral region: Secondary | ICD-10-CM | POA: Diagnosis not present

## 2014-07-07 DIAGNOSIS — E785 Hyperlipidemia, unspecified: Secondary | ICD-10-CM | POA: Diagnosis not present

## 2014-07-07 DIAGNOSIS — F3289 Other specified depressive episodes: Secondary | ICD-10-CM | POA: Diagnosis not present

## 2014-07-07 DIAGNOSIS — G4700 Insomnia, unspecified: Secondary | ICD-10-CM | POA: Diagnosis not present

## 2014-07-07 DIAGNOSIS — Z7982 Long term (current) use of aspirin: Secondary | ICD-10-CM | POA: Diagnosis not present

## 2014-07-07 DIAGNOSIS — Z79899 Other long term (current) drug therapy: Secondary | ICD-10-CM | POA: Diagnosis not present

## 2014-07-07 DIAGNOSIS — IMO0002 Reserved for concepts with insufficient information to code with codable children: Secondary | ICD-10-CM | POA: Diagnosis not present

## 2014-07-07 DIAGNOSIS — M47817 Spondylosis without myelopathy or radiculopathy, lumbosacral region: Secondary | ICD-10-CM | POA: Diagnosis not present

## 2014-07-07 DIAGNOSIS — I10 Essential (primary) hypertension: Secondary | ICD-10-CM | POA: Diagnosis not present

## 2014-07-07 DIAGNOSIS — Z886 Allergy status to analgesic agent status: Secondary | ICD-10-CM | POA: Diagnosis not present

## 2014-07-07 DIAGNOSIS — F411 Generalized anxiety disorder: Secondary | ICD-10-CM | POA: Diagnosis not present

## 2014-07-07 DIAGNOSIS — F329 Major depressive disorder, single episode, unspecified: Secondary | ICD-10-CM | POA: Diagnosis not present

## 2014-07-07 DIAGNOSIS — J449 Chronic obstructive pulmonary disease, unspecified: Secondary | ICD-10-CM | POA: Diagnosis not present

## 2014-07-11 DIAGNOSIS — I8310 Varicose veins of unspecified lower extremity with inflammation: Secondary | ICD-10-CM | POA: Diagnosis not present

## 2014-07-19 DIAGNOSIS — G548 Other nerve root and plexus disorders: Secondary | ICD-10-CM | POA: Diagnosis not present

## 2014-07-19 DIAGNOSIS — M5137 Other intervertebral disc degeneration, lumbosacral region: Secondary | ICD-10-CM | POA: Diagnosis not present

## 2014-07-19 DIAGNOSIS — IMO0001 Reserved for inherently not codable concepts without codable children: Secondary | ICD-10-CM | POA: Diagnosis not present

## 2014-07-19 DIAGNOSIS — M47817 Spondylosis without myelopathy or radiculopathy, lumbosacral region: Secondary | ICD-10-CM | POA: Diagnosis not present

## 2014-07-27 ENCOUNTER — Other Ambulatory Visit: Payer: Self-pay

## 2014-07-27 DIAGNOSIS — M1710 Unilateral primary osteoarthritis, unspecified knee: Secondary | ICD-10-CM

## 2014-07-27 DIAGNOSIS — IMO0002 Reserved for concepts with insufficient information to code with codable children: Principal | ICD-10-CM

## 2014-07-27 MED ORDER — HYDROCODONE-ACETAMINOPHEN 10-325 MG PO TABS: ORAL_TABLET | ORAL | Status: DC

## 2014-07-28 ENCOUNTER — Other Ambulatory Visit: Payer: Self-pay | Admitting: Family Medicine

## 2014-07-29 ENCOUNTER — Encounter (HOSPITAL_COMMUNITY): Payer: Self-pay | Admitting: Emergency Medicine

## 2014-07-29 ENCOUNTER — Emergency Department (HOSPITAL_COMMUNITY)
Admission: EM | Admit: 2014-07-29 | Discharge: 2014-07-29 | Disposition: A | Discharge status: Home / Self Care | Payer: MEDICARE | Attending: Emergency Medicine | Admitting: Emergency Medicine

## 2014-07-29 ENCOUNTER — Emergency Department (HOSPITAL_COMMUNITY): Payer: MEDICARE

## 2014-07-29 DIAGNOSIS — E669 Obesity, unspecified: Secondary | ICD-10-CM | POA: Insufficient documentation

## 2014-07-29 DIAGNOSIS — Z880 Allergy status to penicillin: Secondary | ICD-10-CM | POA: Diagnosis not present

## 2014-07-29 DIAGNOSIS — Z8669 Personal history of other diseases of the nervous system and sense organs: Secondary | ICD-10-CM | POA: Insufficient documentation

## 2014-07-29 DIAGNOSIS — F17200 Nicotine dependence, unspecified, uncomplicated: Secondary | ICD-10-CM | POA: Diagnosis not present

## 2014-07-29 DIAGNOSIS — Z8719 Personal history of other diseases of the digestive system: Secondary | ICD-10-CM | POA: Insufficient documentation

## 2014-07-29 DIAGNOSIS — M129 Arthropathy, unspecified: Secondary | ICD-10-CM | POA: Insufficient documentation

## 2014-07-29 DIAGNOSIS — I10 Essential (primary) hypertension: Secondary | ICD-10-CM | POA: Diagnosis not present

## 2014-07-29 DIAGNOSIS — R109 Unspecified abdominal pain: Secondary | ICD-10-CM | POA: Insufficient documentation

## 2014-07-29 DIAGNOSIS — Z7982 Long term (current) use of aspirin: Secondary | ICD-10-CM | POA: Insufficient documentation

## 2014-07-29 DIAGNOSIS — Z87442 Personal history of urinary calculi: Secondary | ICD-10-CM | POA: Diagnosis not present

## 2014-07-29 DIAGNOSIS — E785 Hyperlipidemia, unspecified: Secondary | ICD-10-CM | POA: Diagnosis not present

## 2014-07-29 DIAGNOSIS — Z79899 Other long term (current) drug therapy: Secondary | ICD-10-CM | POA: Insufficient documentation

## 2014-07-29 DIAGNOSIS — E119 Type 2 diabetes mellitus without complications: Secondary | ICD-10-CM | POA: Insufficient documentation

## 2014-07-29 DIAGNOSIS — Z85819 Personal history of malignant neoplasm of unspecified site of lip, oral cavity, and pharynx: Secondary | ICD-10-CM | POA: Diagnosis not present

## 2014-07-29 DIAGNOSIS — K5730 Diverticulosis of large intestine without perforation or abscess without bleeding: Secondary | ICD-10-CM | POA: Diagnosis not present

## 2014-07-29 HISTORY — DX: Diverticulosis of intestine, part unspecified, without perforation or abscess without bleeding: K57.90

## 2014-07-29 HISTORY — DX: Calculus of kidney: N20.0

## 2014-07-29 HISTORY — DX: Irritable bowel syndrome, unspecified: K58.9

## 2014-07-29 LAB — CBC WITH DIFFERENTIAL/PLATELET
BASOS ABS: 0.1 10*3/uL (ref 0.0–0.1)
BASOS PCT: 1 % (ref 0–1)
EOS ABS: 0.9 10*3/uL — AB (ref 0.0–0.7)
EOS PCT: 13 % — AB (ref 0–5)
HEMATOCRIT: 44.6 % (ref 36.0–46.0)
Hemoglobin: 14.8 g/dL (ref 12.0–15.0)
Lymphocytes Relative: 23 % (ref 12–46)
Lymphs Abs: 1.6 10*3/uL (ref 0.7–4.0)
MCH: 30.6 pg (ref 26.0–34.0)
MCHC: 33.2 g/dL (ref 30.0–36.0)
MCV: 92.3 fL (ref 78.0–100.0)
MONO ABS: 0.6 10*3/uL (ref 0.1–1.0)
Monocytes Relative: 9 % (ref 3–12)
Neutro Abs: 3.8 10*3/uL (ref 1.7–7.7)
Neutrophils Relative %: 54 % (ref 43–77)
Platelets: 214 10*3/uL (ref 150–400)
RBC: 4.83 MIL/uL (ref 3.87–5.11)
RDW: 14 % (ref 11.5–15.5)
WBC: 7 10*3/uL (ref 4.0–10.5)

## 2014-07-29 LAB — URINALYSIS, ROUTINE W REFLEX MICROSCOPIC
Bilirubin Urine: NEGATIVE
Glucose, UA: NEGATIVE mg/dL
Ketones, ur: NEGATIVE mg/dL
LEUKOCYTES UA: NEGATIVE
Nitrite: NEGATIVE
PROTEIN: NEGATIVE mg/dL
Specific Gravity, Urine: 1.01 (ref 1.005–1.030)
UROBILINOGEN UA: 0.2 mg/dL (ref 0.0–1.0)
pH: 8 (ref 5.0–8.0)

## 2014-07-29 LAB — BASIC METABOLIC PANEL
Anion gap: 10 (ref 5–15)
BUN: 10 mg/dL (ref 6–23)
CALCIUM: 9.4 mg/dL (ref 8.4–10.5)
CO2: 29 meq/L (ref 19–32)
CREATININE: 0.73 mg/dL (ref 0.50–1.10)
Chloride: 101 mEq/L (ref 96–112)
GFR calc Af Amer: >90 mL/min (ref >90)
GFR, EST NON AFRICAN AMERICAN: 88 mL/min — AB (ref >90)
GLUCOSE: 107 mg/dL — AB (ref 70–99)
Potassium: 4.6 mEq/L (ref 3.7–5.3)
Sodium: 140 mEq/L (ref 137–147)

## 2014-07-29 LAB — URINE MICROSCOPIC-ADD ON

## 2014-07-29 MED ORDER — MORPHINE SULFATE 4 MG/ML IJ SOLN
4.0000 mg | Freq: Once | INTRAMUSCULAR | Status: AC
  Administered 2014-07-29: 4 mg via INTRAVENOUS
  Filled 2014-07-29: qty 1

## 2014-07-29 MED ORDER — ONDANSETRON HCL 4 MG/2ML IJ SOLN
4.0000 mg | Freq: Once | INTRAMUSCULAR | Status: AC
  Administered 2014-07-29: 4 mg via INTRAVENOUS
  Filled 2014-07-29: qty 2

## 2014-07-29 MED ORDER — IOHEXOL 300 MG/ML  SOLN
100.0000 mL | Freq: Once | INTRAMUSCULAR | Status: AC | PRN
  Administered 2014-07-29: 100 mL via INTRAVENOUS

## 2014-07-29 MED ORDER — HYDROCODONE-ACETAMINOPHEN 5-325 MG PO TABS: 2.0000 | ORAL_TABLET | ORAL | Status: DC | PRN

## 2014-07-29 MED ORDER — SODIUM CHLORIDE 0.9 % IV BOLUS (SEPSIS)
500.0000 mL | Freq: Once | INTRAVENOUS | Status: AC
  Administered 2014-07-29: 500 mL via INTRAVENOUS

## 2014-07-29 MED ORDER — PROMETHAZINE HCL 25 MG PO TABS: 25.0000 mg | ORAL_TABLET | Freq: Four times a day (QID) | ORAL | Status: DC | PRN

## 2014-07-29 NOTE — ED Notes (Signed)
Patient resting. Eyes closed. No distress.

## 2014-07-29 NOTE — ED Notes (Signed)
Patient with no complaints at this time. Respirations even and unlabored. Skin warm/dry. Discharge instructions reviewed with patient at this time. Patient given opportunity to voice concerns/ask questions. IV removed per policy and band-aid applied to site. Patient discharged at this time and left Emergency Department with steady gait.  

## 2014-07-29 NOTE — ED Notes (Signed)
Pt c/o left side pain that radiates to left flank area that has remained constant now for the past few weeks, pain is associated with nausea at times, pt states that she has been having diarrhea over the past few weeks but has ibs and is not sure of the diarrhea is related to this pain or the ibs.

## 2014-07-29 NOTE — Discharge Instructions (Signed)
CT scan showed no acute findings. Medication for pain and nausea. Followup your primary care Dr.

## 2014-07-29 NOTE — ED Provider Notes (Signed)
CSN: [id number]     Arrival date & time 07/29/14  1696 History  This chart was scribed for Nat Christen, MD by Ludger Nutting, ED Scribe. This patient was seen in room APA03/AP[facility]03 and the patient's care was started 9:11 AM.    Chief Complaint  Patient presents with  . Flank Pain    The history is provided by the patient. No language interpreter was used.    HPI Comments: Adrienne Nelson is a 63 y.o. female with past medical history of kidney stones who presents to the Emergency Department complaining of constant, gradually worsening, non-radiating left flank pain for the past few weeks. Patient states she has been seen by her PCP but has not had any imaging completed for this pain. She denies hematuria, dysuria, urinary frequency, abdominal pain, fever, chills, diarrhea, vomiting.  Severity is moderate. Nothing makes symptoms better or worse. History of kidney stones.   PCP Moshe Cipro   Past Medical History  Diagnosis Date  . Hand pain     bilateral    . Arthritis of knee, right   . Throat cancer   . Depression   . Hearing loss   . Ulcerative colitis   . Hyperlipidemia   . Type 2 diabetes mellitus without complications   . Hypertension   . Hypercholesteremia   . IBS (irritable bowel syndrome)   . Diverticulosis   . Kidney stones    Past Surgical History  Procedure Laterality Date  . Cesarean section    . Cesarean section    . Carpal tunnel release    . Cholecystectomy    . Total abdominal hysterectomy    . Left trigger finger surgery  Dec. 2011  . Colonoscopy N/A 04/06/2014    Procedure: COLONOSCOPY;  Surgeon: Rogene Houston, MD;  Location: [facility];  Service: Endoscopy;  Laterality: N/A;  2   Family History  Problem Relation Age of Onset  . Lung cancer Mother   . Colon cancer Father    History  Substance Use Topics  . Smoking status: Current Every Day Smoker -- 0.50 packs/day for 20 years    Types: Cigarettes  . Smokeless tobacco: Not on file  . Alcohol Use: No    OB History   Grav Para Term Preterm Abortions TAB SAB Ect Mult Living                 Review of Systems  A complete 10 system review of systems was obtained and all systems are negative except as noted in the HPI and PMH.    Allergies  Penicillins and Ibuprofen  Home Medications   Prior to Admission medications   Medication Sig Start Date End Date Taking? Authorizing Provider  aspirin (ASPIRIN LOW DOSE) 81 MG EC tablet Take 81 mg by mouth daily.     Yes Historical Provider, MD  Calcium Carbonate-Vitamin D (CALCIUM 600+D) 600-400 MG-UNIT per tablet Take 1 tablet by mouth 3 (three) times daily with meals.     Yes Historical Provider, MD  cloNIDine (CATAPRES) 0.1 MG tablet Take 0.1 mg by mouth at bedtime.   Yes Historical Provider, MD  FLUoxetine (PROZAC) 40 MG capsule Take 1 capsule (40 mg total) by mouth daily. 04/25/14  Yes Fayrene Helper, MD  furosemide (LASIX) 20 MG tablet Take 20 mg by mouth daily as needed for fluid or edema.    Yes Historical Provider, MD  gabapentin (NEURONTIN) 600 MG tablet Take 600 mg by mouth 2 (two) times daily.  Yes Historical Provider, MD  HYDROcodone-acetaminophen (NORCO) 10-325 MG per tablet Take 1 tablet by mouth 2 (two) times daily as needed for moderate pain.    Yes Historical Provider, MD  lisinopril (PRINIVIL,ZESTRIL) 40 MG tablet Take 40 mg by mouth daily.   Yes Historical Provider, MD  metFORMIN (GLUCOPHAGE) 500 MG tablet TAKE ONE TABLET BY MOUTH ONCE DAILY WITH BREAKFAST 07/28/14  Yes Fayrene Helper, MD  Multiple Vitamin (MULTIVITAMIN) tablet Take 1 tablet by mouth daily.     Yes Historical Provider, MD  potassium chloride SA (K-DUR,KLOR-CON) 20 MEQ tablet Take 20 mEq by mouth 2 (two) times daily.   Yes Historical Provider, MD  simvastatin (ZOCOR) 40 MG tablet Take 40 mg by mouth at bedtime.   Yes Historical Provider, MD  traZODone (DESYREL) 100 MG tablet Take 300 mg by mouth at bedtime.   Yes Historical Provider, MD   HYDROcodone-acetaminophen (NORCO) 5-325 MG per tablet Take 2 tablets by mouth every 4 (four) hours as needed. 07/29/14   Nat Christen, MD  promethazine (PHENERGAN) 25 MG tablet Take 1 tablet (25 mg total) by mouth every 6 (six) hours as needed. 07/29/14   Nat Christen, MD   BP 158/97  Pulse 79  Temp(Src) 98.6 F (37 C) (Oral)  Resp 22  Ht 4\' 11"  (1.499 m)  Wt 233 lb (105.688 kg)  BMI 47.04 kg/m2  SpO2 91% Physical Exam  Nursing note and vitals reviewed. Constitutional: She is oriented to person, place, and time. She appears well-developed and well-nourished.  Obese  HENT:  Head: Normocephalic and atraumatic.  Eyes: Conjunctivae and EOM are normal. Pupils are equal, round, and reactive to light.  Neck: Normal range of motion. Neck supple.  Cardiovascular: Normal rate, regular rhythm and normal heart sounds.   Pulmonary/Chest: Effort normal and breath sounds normal.  Abdominal: Soft. Bowel sounds are normal. She exhibits no distension. There is no tenderness.  Genitourinary:  Left flank tenderness  Musculoskeletal: Normal range of motion.  Neurological: She is alert and oriented to person, place, and time.  Skin: Skin is warm and dry.  Psychiatric: She has a normal mood and affect. Her behavior is normal.    ED Course  Procedures (including critical care time)  DIAGNOSTIC STUDIES: Oxygen Saturation is 95% on RA, adequate by my interpretation.    COORDINATION OF CARE: 9:16 AM Treatment plan includes abdominal CT, UA, blood work, and pain management. Discussed treatment plan with pt at bedside and pt agreed to plan.   Labs Review Labs Reviewed  URINALYSIS, ROUTINE W REFLEX MICROSCOPIC - Abnormal; Notable for the following:    Hgb urine dipstick TRACE (*)    All other components within normal limits  BASIC METABOLIC PANEL - Abnormal; Notable for the following:    Glucose, Bld 107 (*)    GFR calc non Af Amer 88 (*)    All other components within normal limits  CBC WITH  DIFFERENTIAL - Abnormal; Notable for the following:    Eosinophils Relative 13 (*)    Eosinophils Absolute 0.9 (*)    All other components within normal limits  URINE MICROSCOPIC-ADD ON - Abnormal; Notable for the following:    Squamous Epithelial / LPF FEW (*)    Bacteria, UA FEW (*)    All other components within normal limits    Imaging Review Ct Abdomen Pelvis W Contrast  07/29/2014   CLINICAL DATA:  Left flank pain for a few days. History of cholecystectomy.  EXAM: CT ABDOMEN AND PELVIS WITH  CONTRAST  TECHNIQUE: Multidetector CT imaging of the abdomen and pelvis was performed using the standard protocol following bolus administration of intravenous contrast.  CONTRAST:  177mL OMNIPAQUE IOHEXOL 300 MG/ML  SOLN  COMPARISON:  CT of the abdomen and pelvis on 04/29/2013  FINDINGS: Lower chest: The lung bases are unremarkable in appearance. There is dense calcification of the mitral annulus.  Upper abdomen: No focal abnormality identified within the liver, spleen pancreas, or adrenal glands. The kidneys have a normal appearance. There is symmetric bilateral renal excretion. No hydronephrosis or hydroureter. The gallbladder is surgically absent.  Bowel: The stomach and small bowel loops are normal in appearance. The appendix is well seen and has a normal appearance. Numerous colonic diverticula are present. No associated inflammation. No retroperitoneal or mesenteric adenopathy. No evidence for aortic aneurysm.  Pelvis: The uterus is surgically absent. No adnexal mass identified.  Abdominal wall: Unremarkable.  Osseous structures: There are moderate degenerative changes within the of lumbar spine with vacuum disc phenomenon at multiple levels. No suspicious lytic or blastic lesions are identified.  IMPRESSION: 1. Mitral annulus calcification. 2. Status post cholecystectomy. 3. Diverticulosis without diverticulitis. 4. Status post hysterectomy.   Electronically Signed   By: Shon Hale M.D.   On: 07/29/2014  10:37     EKG Interpretation None      MDM   Final diagnoses:  Left flank pain    Patient is nontoxic appearing. No acute abdomen. CT scan shows no acute pathology. Urinalysis negative for blood or WBC's.  Discharge medications Vicodin and Phenergan 25 mg  I personally performed the services described in this documentation, which was scribed in my presence. The recorded information has been reviewed and is accurate.   Nat Christen, MD 07/29/14 1344

## 2014-07-31 DIAGNOSIS — M1710 Unilateral primary osteoarthritis, unspecified knee: Secondary | ICD-10-CM | POA: Diagnosis not present

## 2014-07-31 DIAGNOSIS — IMO0002 Reserved for concepts with insufficient information to code with codable children: Secondary | ICD-10-CM | POA: Diagnosis not present

## 2014-08-02 ENCOUNTER — Other Ambulatory Visit: Payer: Self-pay

## 2014-08-02 DIAGNOSIS — M1710 Unilateral primary osteoarthritis, unspecified knee: Secondary | ICD-10-CM

## 2014-08-02 DIAGNOSIS — IMO0002 Reserved for concepts with insufficient information to code with codable children: Principal | ICD-10-CM

## 2014-08-02 MED ORDER — HYDROCODONE-ACETAMINOPHEN 10-325 MG PO TABS: ORAL_TABLET | ORAL | Status: DC

## 2014-08-04 ENCOUNTER — Telehealth: Payer: Self-pay | Admitting: *Deleted

## 2014-08-04 MED ORDER — LEVOFLOXACIN 500 MG PO TABS: 500.0000 mg | ORAL_TABLET | Freq: Every day | ORAL | Status: DC

## 2014-08-04 NOTE — Telephone Encounter (Signed)
Pt called stating she is on complete bed rest except for going to the bathroom because of her right leg and if she wasn't she would come in but she has had a uti since Monday night and she would like to know if she can get anything for it, pt is having the urge to "pee" and it is very pain. Please advise [phone]

## 2014-08-04 NOTE — Telephone Encounter (Signed)
Let her knwo that review of her lab from the Ed earlier this week does not suggest infection. IPls erx levaquin 500mg  one daily #3 only, need to submit c/s next week if still symptomatic, let her know

## 2014-08-04 NOTE — Telephone Encounter (Signed)
Patient aware.   Med sent to requested pharmacy.  Patient will call office if she continues to have symptoms.

## 2014-08-04 NOTE — Telephone Encounter (Signed)
Please advise 

## 2014-08-04 NOTE — Addendum Note (Signed)
Addended by: Denman George B on: 08/04/2014 01:52 PM   Modules accepted: Orders

## 2014-08-21 ENCOUNTER — Telehealth: Payer: Self-pay | Admitting: Family Medicine

## 2014-08-21 DIAGNOSIS — M1710 Unilateral primary osteoarthritis, unspecified knee: Secondary | ICD-10-CM | POA: Diagnosis not present

## 2014-08-21 DIAGNOSIS — IMO0002 Reserved for concepts with insufficient information to code with codable children: Secondary | ICD-10-CM | POA: Diagnosis not present

## 2014-08-22 ENCOUNTER — Other Ambulatory Visit: Payer: Self-pay

## 2014-08-22 DIAGNOSIS — IMO0002 Reserved for concepts with insufficient information to code with codable children: Secondary | ICD-10-CM | POA: Diagnosis not present

## 2014-08-22 MED ORDER — BUSPIRONE HCL 5 MG PO TABS: 5.0000 mg | ORAL_TABLET | Freq: Three times a day (TID) | ORAL | Status: DC

## 2014-08-22 NOTE — Telephone Encounter (Signed)
Med sent and patient having surgery on 9/15 and is aware that we are sending surgeon a letter that he is to prescribe pain med after surgery and notify us when Dr Moshe Cipro is to resume

## 2014-08-22 NOTE — Telephone Encounter (Signed)
Called patient and left message for them to return call at the office   

## 2014-08-22 NOTE — Telephone Encounter (Signed)
Noted, pls type letter so i can sign and send, also establish date of surgery pls

## 2014-08-22 NOTE — Telephone Encounter (Signed)
pls advise and send in buspar 5mg  one three times daily 390 refill x 1 Pls explain she NEEDS to take this every 8 hours for effect  Send message all the best with surgery, get date, explain pain meds will be from surgeon immediately following surgery till he releases, and type letter to Dr  Making him aware of the plan, i will sign  We NEED to speak about this so it is  done today, thanks VERY IMPT

## 2014-08-23 ENCOUNTER — Other Ambulatory Visit: Payer: Self-pay | Admitting: Family Medicine

## 2014-08-23 IMAGING — CR DG RIBS W/ CHEST 3+V*L*
3 series · 3 of 3 positions shown · non-contrast
Comparison: None.

CLINICAL DATA: Left lateral rib pain.

LEFT RIBS AND CHEST - 3+ VIEW

[view not recorded (1 of 3)]
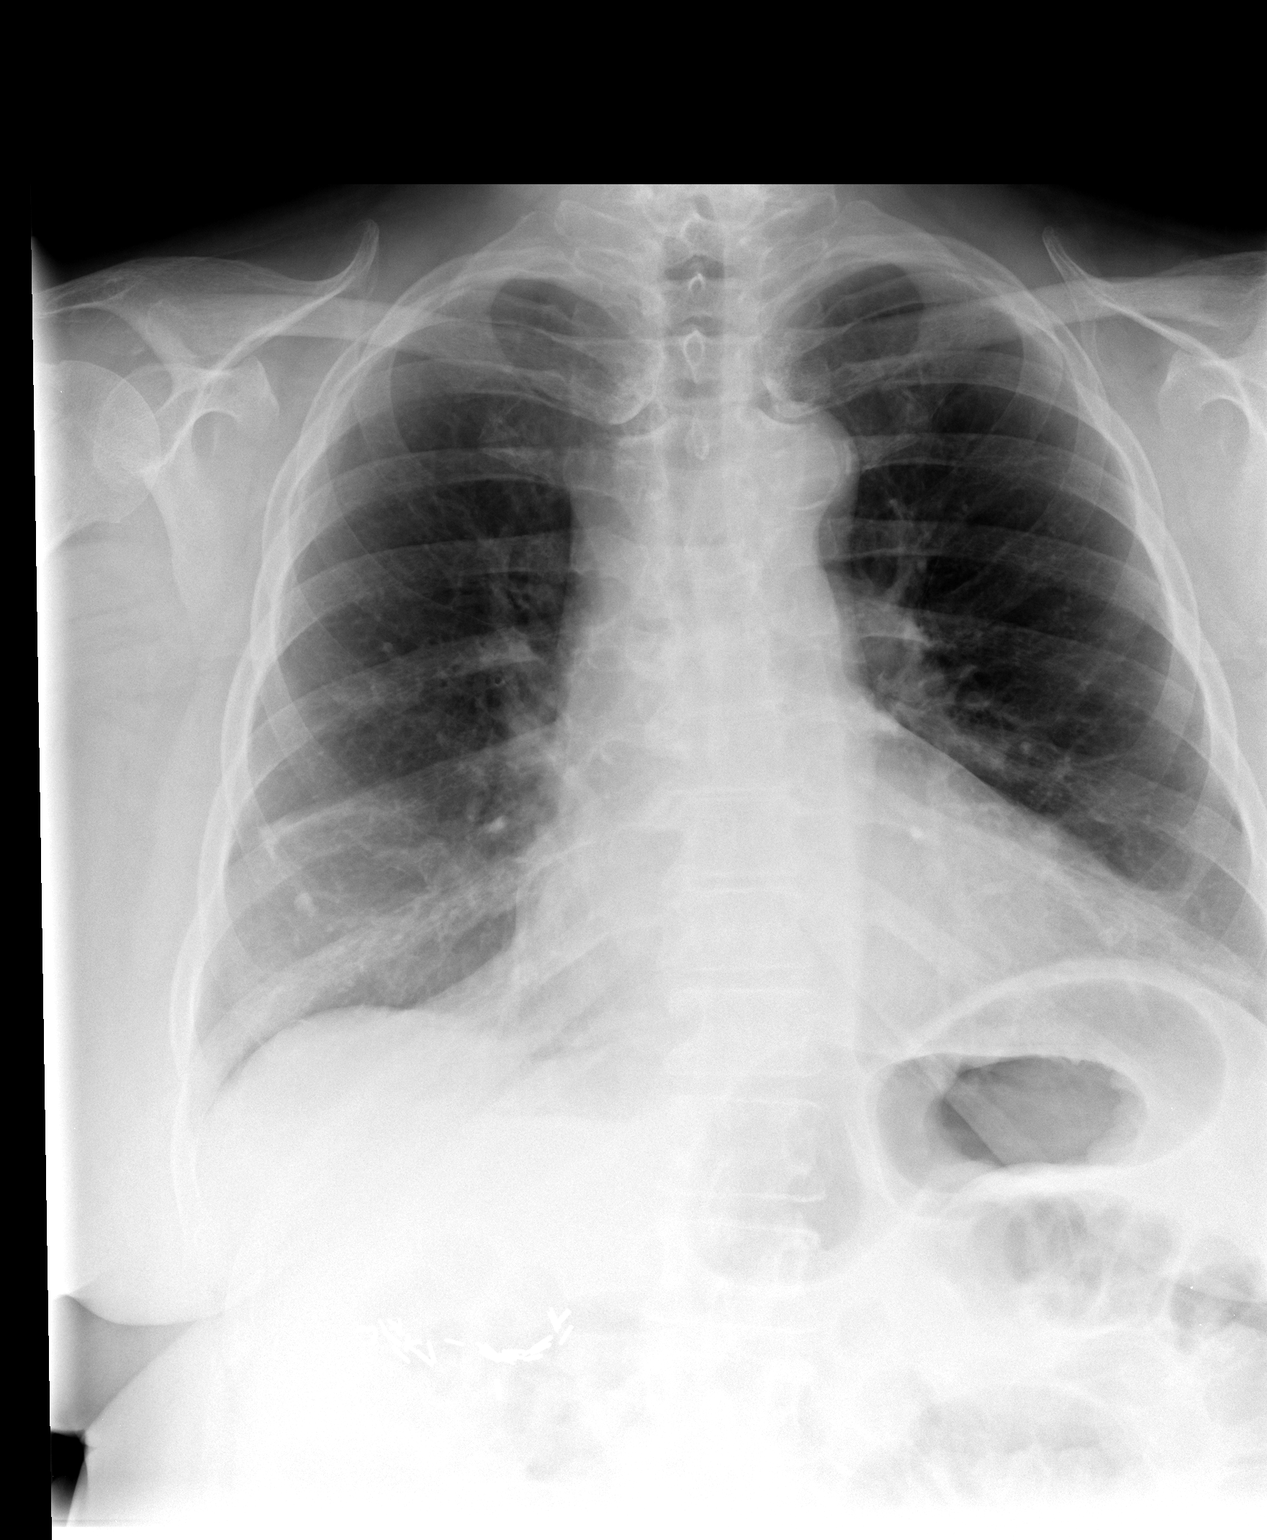

[view not recorded (2 of 3)]
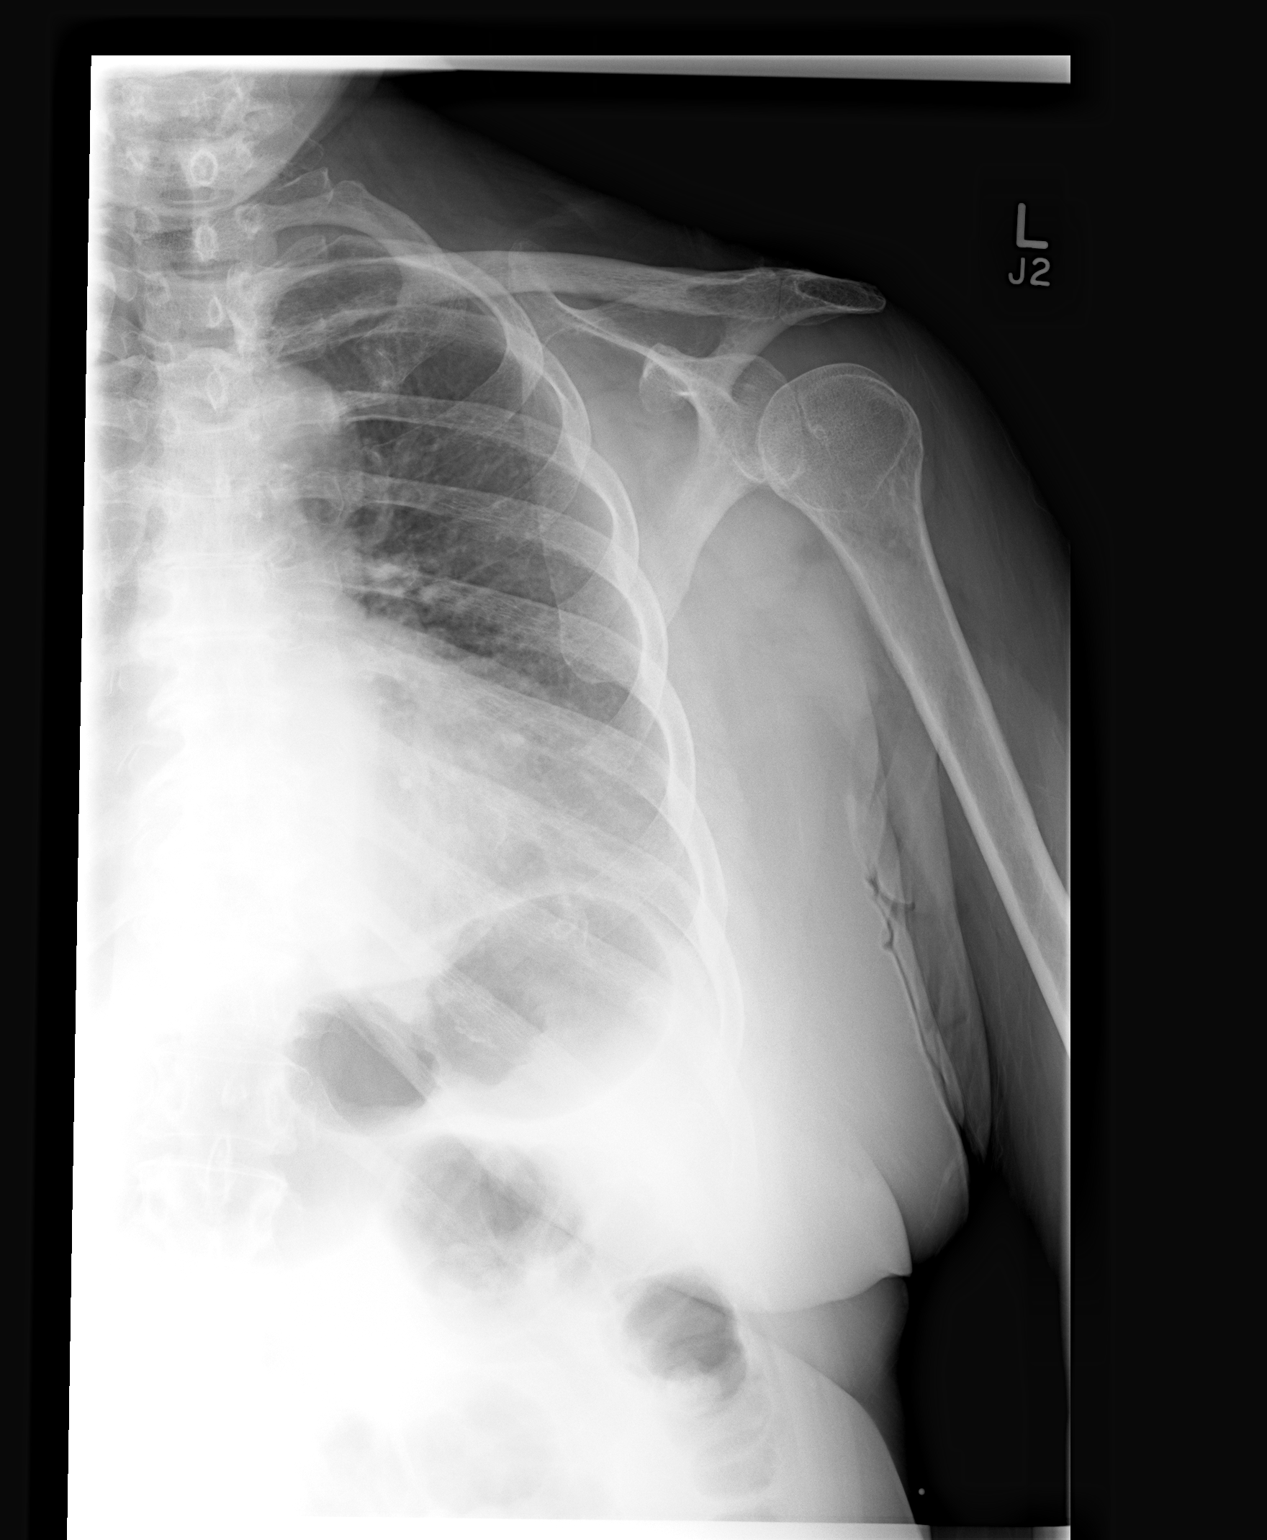

[view not recorded (3 of 3)]
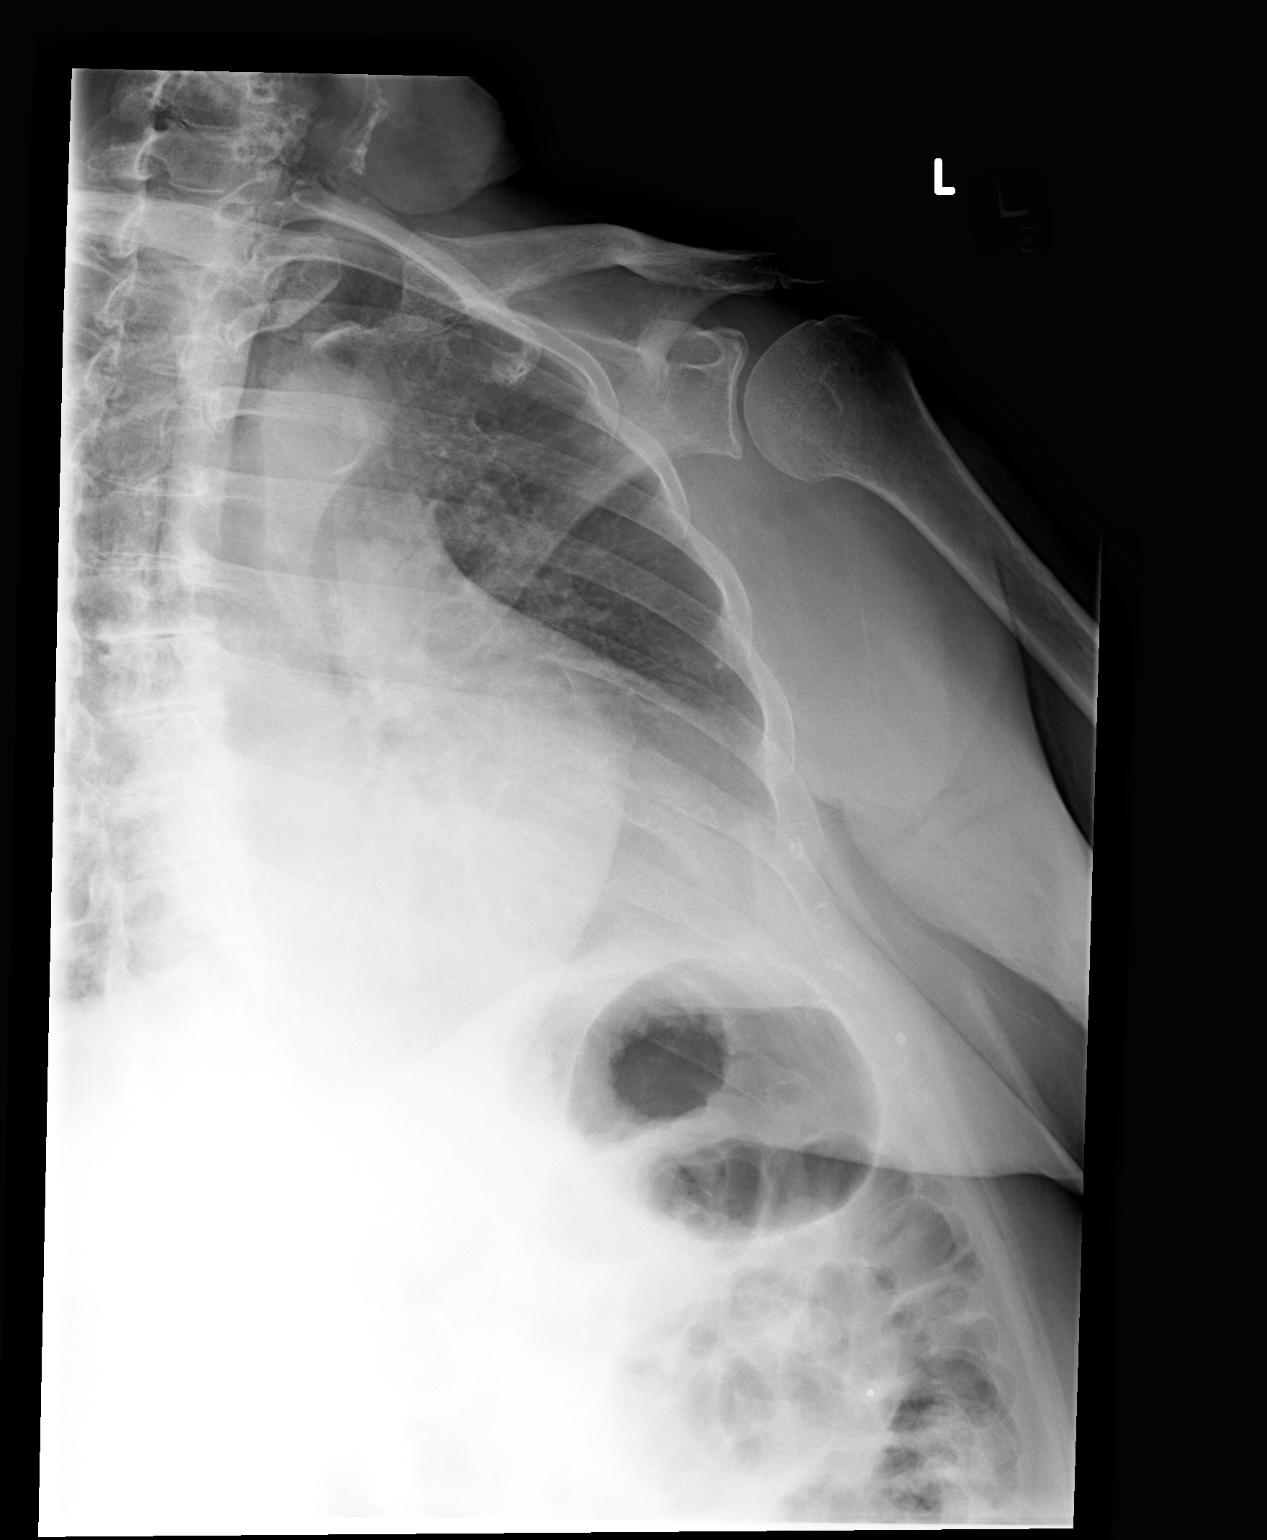

[3 of 3 positions shown; findings below may reference images not displayed]

FINDINGS: Heart is borderline in size.  Linear densities in the
lung bases compatible with scarring.  No acute opacities or
effusions.  No visible rib fracture.  No pneumothorax.
IMPRESSION: Bibasilar scarring.  No acute findings.  No visible rib fracture.

## 2014-08-24 ENCOUNTER — Ambulatory Visit: Payer: MEDICARE | Admitting: Family Medicine

## 2014-08-24 NOTE — Telephone Encounter (Signed)
Noterd, thanks

## 2014-08-30 DIAGNOSIS — Z79899 Other long term (current) drug therapy: Secondary | ICD-10-CM | POA: Diagnosis not present

## 2014-08-30 DIAGNOSIS — J449 Chronic obstructive pulmonary disease, unspecified: Secondary | ICD-10-CM | POA: Diagnosis not present

## 2014-08-30 DIAGNOSIS — I10 Essential (primary) hypertension: Secondary | ICD-10-CM | POA: Diagnosis not present

## 2014-08-30 DIAGNOSIS — E119 Type 2 diabetes mellitus without complications: Secondary | ICD-10-CM | POA: Diagnosis not present

## 2014-08-30 DIAGNOSIS — M1710 Unilateral primary osteoarthritis, unspecified knee: Secondary | ICD-10-CM | POA: Diagnosis not present

## 2014-08-30 DIAGNOSIS — E669 Obesity, unspecified: Secondary | ICD-10-CM | POA: Diagnosis not present

## 2014-08-30 DIAGNOSIS — IMO0002 Reserved for concepts with insufficient information to code with codable children: Secondary | ICD-10-CM | POA: Diagnosis not present

## 2014-08-30 DIAGNOSIS — F17200 Nicotine dependence, unspecified, uncomplicated: Secondary | ICD-10-CM | POA: Diagnosis not present

## 2014-08-30 DIAGNOSIS — Z01818 Encounter for other preprocedural examination: Secondary | ICD-10-CM | POA: Diagnosis not present

## 2014-09-01 DIAGNOSIS — IMO0002 Reserved for concepts with insufficient information to code with codable children: Secondary | ICD-10-CM | POA: Diagnosis not present

## 2014-09-01 DIAGNOSIS — M1710 Unilateral primary osteoarthritis, unspecified knee: Secondary | ICD-10-CM | POA: Diagnosis not present

## 2014-09-05 DIAGNOSIS — Z85819 Personal history of malignant neoplasm of unspecified site of lip, oral cavity, and pharynx: Secondary | ICD-10-CM | POA: Diagnosis not present

## 2014-09-05 DIAGNOSIS — M159 Polyosteoarthritis, unspecified: Secondary | ICD-10-CM | POA: Diagnosis not present

## 2014-09-05 DIAGNOSIS — M1990 Unspecified osteoarthritis, unspecified site: Secondary | ICD-10-CM | POA: Diagnosis not present

## 2014-09-05 DIAGNOSIS — R269 Unspecified abnormalities of gait and mobility: Secondary | ICD-10-CM | POA: Diagnosis not present

## 2014-09-05 DIAGNOSIS — J9620 Acute and chronic respiratory failure, unspecified whether with hypoxia or hypercapnia: Secondary | ICD-10-CM | POA: Diagnosis not present

## 2014-09-05 DIAGNOSIS — Z880 Allergy status to penicillin: Secondary | ICD-10-CM | POA: Diagnosis not present

## 2014-09-05 DIAGNOSIS — IMO0001 Reserved for inherently not codable concepts without codable children: Secondary | ICD-10-CM | POA: Diagnosis not present

## 2014-09-05 DIAGNOSIS — IMO0002 Reserved for concepts with insufficient information to code with codable children: Secondary | ICD-10-CM | POA: Diagnosis not present

## 2014-09-05 DIAGNOSIS — Z6841 Body Mass Index (BMI) 40.0 and over, adult: Secondary | ICD-10-CM | POA: Diagnosis not present

## 2014-09-05 DIAGNOSIS — G252 Other specified forms of tremor: Secondary | ICD-10-CM | POA: Diagnosis not present

## 2014-09-05 DIAGNOSIS — R404 Transient alteration of awareness: Secondary | ICD-10-CM | POA: Diagnosis not present

## 2014-09-05 DIAGNOSIS — G4733 Obstructive sleep apnea (adult) (pediatric): Secondary | ICD-10-CM | POA: Diagnosis present

## 2014-09-05 DIAGNOSIS — J9600 Acute respiratory failure, unspecified whether with hypoxia or hypercapnia: Secondary | ICD-10-CM | POA: Diagnosis not present

## 2014-09-05 DIAGNOSIS — K589 Irritable bowel syndrome without diarrhea: Secondary | ICD-10-CM | POA: Diagnosis not present

## 2014-09-05 DIAGNOSIS — M6281 Muscle weakness (generalized): Secondary | ICD-10-CM | POA: Diagnosis not present

## 2014-09-05 DIAGNOSIS — Z79899 Other long term (current) drug therapy: Secondary | ICD-10-CM | POA: Diagnosis not present

## 2014-09-05 DIAGNOSIS — Z5189 Encounter for other specified aftercare: Secondary | ICD-10-CM | POA: Diagnosis not present

## 2014-09-05 DIAGNOSIS — Z7982 Long term (current) use of aspirin: Secondary | ICD-10-CM | POA: Diagnosis not present

## 2014-09-05 DIAGNOSIS — F411 Generalized anxiety disorder: Secondary | ICD-10-CM | POA: Diagnosis present

## 2014-09-05 DIAGNOSIS — R0902 Hypoxemia: Secondary | ICD-10-CM | POA: Diagnosis not present

## 2014-09-05 DIAGNOSIS — Z471 Aftercare following joint replacement surgery: Secondary | ICD-10-CM | POA: Diagnosis not present

## 2014-09-05 DIAGNOSIS — F329 Major depressive disorder, single episode, unspecified: Secondary | ICD-10-CM | POA: Diagnosis present

## 2014-09-05 DIAGNOSIS — J449 Chronic obstructive pulmonary disease, unspecified: Secondary | ICD-10-CM | POA: Diagnosis not present

## 2014-09-05 DIAGNOSIS — F410 Panic disorder [episodic paroxysmal anxiety] without agoraphobia: Secondary | ICD-10-CM | POA: Diagnosis not present

## 2014-09-05 DIAGNOSIS — M1710 Unilateral primary osteoarthritis, unspecified knee: Secondary | ICD-10-CM | POA: Diagnosis present

## 2014-09-05 DIAGNOSIS — G250 Essential tremor: Secondary | ICD-10-CM | POA: Diagnosis not present

## 2014-09-05 DIAGNOSIS — F3289 Other specified depressive episodes: Secondary | ICD-10-CM | POA: Diagnosis not present

## 2014-09-05 DIAGNOSIS — E785 Hyperlipidemia, unspecified: Secondary | ICD-10-CM | POA: Diagnosis not present

## 2014-09-05 DIAGNOSIS — Z8521 Personal history of malignant neoplasm of larynx: Secondary | ICD-10-CM | POA: Diagnosis not present

## 2014-09-05 DIAGNOSIS — I10 Essential (primary) hypertension: Secondary | ICD-10-CM | POA: Diagnosis present

## 2014-09-05 DIAGNOSIS — Z96659 Presence of unspecified artificial knee joint: Secondary | ICD-10-CM | POA: Diagnosis not present

## 2014-09-05 DIAGNOSIS — J4489 Other specified chronic obstructive pulmonary disease: Secondary | ICD-10-CM | POA: Diagnosis not present

## 2014-09-05 DIAGNOSIS — K219 Gastro-esophageal reflux disease without esophagitis: Secondary | ICD-10-CM | POA: Diagnosis present

## 2014-09-05 DIAGNOSIS — E119 Type 2 diabetes mellitus without complications: Secondary | ICD-10-CM | POA: Diagnosis present

## 2014-09-05 HISTORY — PX: JOINT REPLACEMENT: SHX530

## 2014-09-11 DIAGNOSIS — Z85819 Personal history of malignant neoplasm of unspecified site of lip, oral cavity, and pharynx: Secondary | ICD-10-CM | POA: Diagnosis not present

## 2014-09-11 DIAGNOSIS — K589 Irritable bowel syndrome without diarrhea: Secondary | ICD-10-CM | POA: Diagnosis not present

## 2014-09-11 DIAGNOSIS — J449 Chronic obstructive pulmonary disease, unspecified: Secondary | ICD-10-CM | POA: Diagnosis not present

## 2014-09-11 DIAGNOSIS — Z96651 Presence of right artificial knee joint: Secondary | ICD-10-CM | POA: Diagnosis not present

## 2014-09-11 DIAGNOSIS — M159 Polyosteoarthritis, unspecified: Secondary | ICD-10-CM | POA: Diagnosis not present

## 2014-09-11 DIAGNOSIS — M1990 Unspecified osteoarthritis, unspecified site: Secondary | ICD-10-CM | POA: Diagnosis not present

## 2014-09-11 DIAGNOSIS — F329 Major depressive disorder, single episode, unspecified: Secondary | ICD-10-CM | POA: Diagnosis not present

## 2014-09-11 DIAGNOSIS — B0229 Other postherpetic nervous system involvement: Secondary | ICD-10-CM | POA: Diagnosis not present

## 2014-09-11 DIAGNOSIS — G252 Other specified forms of tremor: Secondary | ICD-10-CM | POA: Diagnosis not present

## 2014-09-11 DIAGNOSIS — Z880 Allergy status to penicillin: Secondary | ICD-10-CM | POA: Diagnosis not present

## 2014-09-11 DIAGNOSIS — Z471 Aftercare following joint replacement surgery: Secondary | ICD-10-CM | POA: Diagnosis not present

## 2014-09-11 DIAGNOSIS — J9692 Respiratory failure, unspecified with hypercapnia: Secondary | ICD-10-CM | POA: Diagnosis not present

## 2014-09-11 DIAGNOSIS — L0390 Cellulitis, unspecified: Secondary | ICD-10-CM | POA: Diagnosis not present

## 2014-09-11 DIAGNOSIS — J9600 Acute respiratory failure, unspecified whether with hypoxia or hypercapnia: Secondary | ICD-10-CM | POA: Diagnosis not present

## 2014-09-11 DIAGNOSIS — Z96659 Presence of unspecified artificial knee joint: Secondary | ICD-10-CM | POA: Diagnosis not present

## 2014-09-11 DIAGNOSIS — F410 Panic disorder [episodic paroxysmal anxiety] without agoraphobia: Secondary | ICD-10-CM | POA: Diagnosis not present

## 2014-09-11 DIAGNOSIS — M1710 Unilateral primary osteoarthritis, unspecified knee: Secondary | ICD-10-CM | POA: Diagnosis not present

## 2014-09-11 DIAGNOSIS — IMO0001 Reserved for inherently not codable concepts without codable children: Secondary | ICD-10-CM | POA: Diagnosis not present

## 2014-09-11 DIAGNOSIS — M6281 Muscle weakness (generalized): Secondary | ICD-10-CM | POA: Diagnosis not present

## 2014-09-11 DIAGNOSIS — R269 Unspecified abnormalities of gait and mobility: Secondary | ICD-10-CM | POA: Diagnosis not present

## 2014-09-11 DIAGNOSIS — I10 Essential (primary) hypertension: Secondary | ICD-10-CM | POA: Diagnosis not present

## 2014-09-11 DIAGNOSIS — F3289 Other specified depressive episodes: Secondary | ICD-10-CM | POA: Diagnosis not present

## 2014-09-11 DIAGNOSIS — G250 Essential tremor: Secondary | ICD-10-CM | POA: Diagnosis not present

## 2014-09-11 DIAGNOSIS — IMO0002 Reserved for concepts with insufficient information to code with codable children: Secondary | ICD-10-CM | POA: Diagnosis not present

## 2014-09-11 DIAGNOSIS — E785 Hyperlipidemia, unspecified: Secondary | ICD-10-CM | POA: Diagnosis not present

## 2014-09-11 DIAGNOSIS — E119 Type 2 diabetes mellitus without complications: Secondary | ICD-10-CM | POA: Diagnosis not present

## 2014-09-11 DIAGNOSIS — Z5189 Encounter for other specified aftercare: Secondary | ICD-10-CM | POA: Diagnosis not present

## 2014-09-15 DIAGNOSIS — Z96659 Presence of unspecified artificial knee joint: Secondary | ICD-10-CM | POA: Diagnosis not present

## 2014-09-21 DIAGNOSIS — Z96651 Presence of right artificial knee joint: Secondary | ICD-10-CM | POA: Diagnosis not present

## 2014-09-21 DIAGNOSIS — L0390 Cellulitis, unspecified: Secondary | ICD-10-CM | POA: Diagnosis not present

## 2014-09-21 DIAGNOSIS — M6281 Muscle weakness (generalized): Secondary | ICD-10-CM | POA: Diagnosis not present

## 2014-09-21 DIAGNOSIS — G250 Essential tremor: Secondary | ICD-10-CM | POA: Diagnosis not present

## 2014-09-21 DIAGNOSIS — B0229 Other postherpetic nervous system involvement: Secondary | ICD-10-CM | POA: Diagnosis not present

## 2014-09-21 DIAGNOSIS — Z5189 Encounter for other specified aftercare: Secondary | ICD-10-CM | POA: Diagnosis not present

## 2014-09-21 DIAGNOSIS — M1990 Unspecified osteoarthritis, unspecified site: Secondary | ICD-10-CM | POA: Diagnosis not present

## 2014-09-21 DIAGNOSIS — F329 Major depressive disorder, single episode, unspecified: Secondary | ICD-10-CM | POA: Diagnosis not present

## 2014-09-21 DIAGNOSIS — J449 Chronic obstructive pulmonary disease, unspecified: Secondary | ICD-10-CM | POA: Diagnosis not present

## 2014-09-21 DIAGNOSIS — Z85819 Personal history of malignant neoplasm of unspecified site of lip, oral cavity, and pharynx: Secondary | ICD-10-CM | POA: Diagnosis not present

## 2014-09-21 DIAGNOSIS — Z471 Aftercare following joint replacement surgery: Secondary | ICD-10-CM | POA: Diagnosis not present

## 2014-09-21 DIAGNOSIS — R269 Unspecified abnormalities of gait and mobility: Secondary | ICD-10-CM | POA: Diagnosis not present

## 2014-09-21 DIAGNOSIS — F410 Panic disorder [episodic paroxysmal anxiety] without agoraphobia: Secondary | ICD-10-CM | POA: Diagnosis not present

## 2014-09-21 DIAGNOSIS — E785 Hyperlipidemia, unspecified: Secondary | ICD-10-CM | POA: Diagnosis not present

## 2014-09-21 DIAGNOSIS — I10 Essential (primary) hypertension: Secondary | ICD-10-CM | POA: Diagnosis not present

## 2014-09-21 DIAGNOSIS — J9692 Respiratory failure, unspecified with hypercapnia: Secondary | ICD-10-CM | POA: Diagnosis not present

## 2014-09-21 DIAGNOSIS — E119 Type 2 diabetes mellitus without complications: Secondary | ICD-10-CM | POA: Diagnosis not present

## 2014-09-23 DIAGNOSIS — G250 Essential tremor: Secondary | ICD-10-CM | POA: Diagnosis not present

## 2014-09-23 DIAGNOSIS — E119 Type 2 diabetes mellitus without complications: Secondary | ICD-10-CM | POA: Diagnosis not present

## 2014-09-23 DIAGNOSIS — Z471 Aftercare following joint replacement surgery: Secondary | ICD-10-CM | POA: Diagnosis not present

## 2014-09-23 DIAGNOSIS — K5190 Ulcerative colitis, unspecified, without complications: Secondary | ICD-10-CM | POA: Diagnosis not present

## 2014-09-23 DIAGNOSIS — Z96651 Presence of right artificial knee joint: Secondary | ICD-10-CM | POA: Diagnosis not present

## 2014-09-23 DIAGNOSIS — F411 Generalized anxiety disorder: Secondary | ICD-10-CM | POA: Diagnosis not present

## 2014-09-23 DIAGNOSIS — Z8521 Personal history of malignant neoplasm of larynx: Secondary | ICD-10-CM | POA: Diagnosis not present

## 2014-09-23 DIAGNOSIS — Z7901 Long term (current) use of anticoagulants: Secondary | ICD-10-CM | POA: Diagnosis not present

## 2014-09-23 DIAGNOSIS — K589 Irritable bowel syndrome without diarrhea: Secondary | ICD-10-CM | POA: Diagnosis not present

## 2014-09-23 DIAGNOSIS — Z6841 Body Mass Index (BMI) 40.0 and over, adult: Secondary | ICD-10-CM | POA: Diagnosis not present

## 2014-09-23 DIAGNOSIS — F329 Major depressive disorder, single episode, unspecified: Secondary | ICD-10-CM | POA: Diagnosis not present

## 2014-09-23 DIAGNOSIS — I10 Essential (primary) hypertension: Secondary | ICD-10-CM | POA: Diagnosis not present

## 2014-09-23 DIAGNOSIS — Z720 Tobacco use: Secondary | ICD-10-CM | POA: Diagnosis not present

## 2014-09-23 DIAGNOSIS — J449 Chronic obstructive pulmonary disease, unspecified: Secondary | ICD-10-CM | POA: Diagnosis not present

## 2014-09-25 ENCOUNTER — Telehealth: Payer: Self-pay

## 2014-09-25 ENCOUNTER — Other Ambulatory Visit: Payer: Self-pay

## 2014-09-25 ENCOUNTER — Telehealth: Payer: Self-pay | Admitting: Family Medicine

## 2014-09-25 DIAGNOSIS — F411 Generalized anxiety disorder: Secondary | ICD-10-CM | POA: Diagnosis not present

## 2014-09-25 DIAGNOSIS — I10 Essential (primary) hypertension: Secondary | ICD-10-CM | POA: Diagnosis not present

## 2014-09-25 DIAGNOSIS — F329 Major depressive disorder, single episode, unspecified: Secondary | ICD-10-CM | POA: Diagnosis not present

## 2014-09-25 DIAGNOSIS — Z471 Aftercare following joint replacement surgery: Secondary | ICD-10-CM | POA: Diagnosis not present

## 2014-09-25 DIAGNOSIS — E119 Type 2 diabetes mellitus without complications: Secondary | ICD-10-CM | POA: Diagnosis not present

## 2014-09-25 DIAGNOSIS — J449 Chronic obstructive pulmonary disease, unspecified: Secondary | ICD-10-CM | POA: Diagnosis not present

## 2014-09-25 MED ORDER — AMLODIPINE BESYLATE 5 MG PO TABS: 5.0000 mg | ORAL_TABLET | Freq: Every day | ORAL | Status: DC

## 2014-09-25 NOTE — Telephone Encounter (Signed)
Patient is asking for a refill on amlodipine 5mg 

## 2014-09-25 NOTE — Telephone Encounter (Signed)
Called and left message for patient to return call.  

## 2014-09-25 NOTE — Telephone Encounter (Signed)
See previous message

## 2014-09-26 DIAGNOSIS — E119 Type 2 diabetes mellitus without complications: Secondary | ICD-10-CM | POA: Diagnosis not present

## 2014-09-26 DIAGNOSIS — F411 Generalized anxiety disorder: Secondary | ICD-10-CM | POA: Diagnosis not present

## 2014-09-26 DIAGNOSIS — I10 Essential (primary) hypertension: Secondary | ICD-10-CM | POA: Diagnosis not present

## 2014-09-26 DIAGNOSIS — J449 Chronic obstructive pulmonary disease, unspecified: Secondary | ICD-10-CM | POA: Diagnosis not present

## 2014-09-26 DIAGNOSIS — F329 Major depressive disorder, single episode, unspecified: Secondary | ICD-10-CM | POA: Diagnosis not present

## 2014-09-26 DIAGNOSIS — Z471 Aftercare following joint replacement surgery: Secondary | ICD-10-CM | POA: Diagnosis not present

## 2014-09-27 DIAGNOSIS — F411 Generalized anxiety disorder: Secondary | ICD-10-CM | POA: Diagnosis not present

## 2014-09-27 DIAGNOSIS — J449 Chronic obstructive pulmonary disease, unspecified: Secondary | ICD-10-CM | POA: Diagnosis not present

## 2014-09-27 DIAGNOSIS — Z471 Aftercare following joint replacement surgery: Secondary | ICD-10-CM | POA: Diagnosis not present

## 2014-09-27 DIAGNOSIS — F329 Major depressive disorder, single episode, unspecified: Secondary | ICD-10-CM | POA: Diagnosis not present

## 2014-09-27 DIAGNOSIS — I10 Essential (primary) hypertension: Secondary | ICD-10-CM | POA: Diagnosis not present

## 2014-09-27 DIAGNOSIS — E119 Type 2 diabetes mellitus without complications: Secondary | ICD-10-CM | POA: Diagnosis not present

## 2014-09-27 IMAGING — CR DG CHEST 2V
2 series · 2 of 2 positions shown · non-contrast
Comparison: April 18, 2013.

CLINICAL DATA: Cough, acute bronchitis

CHEST - 2 VIEW

[view not recorded (1 of 2)]
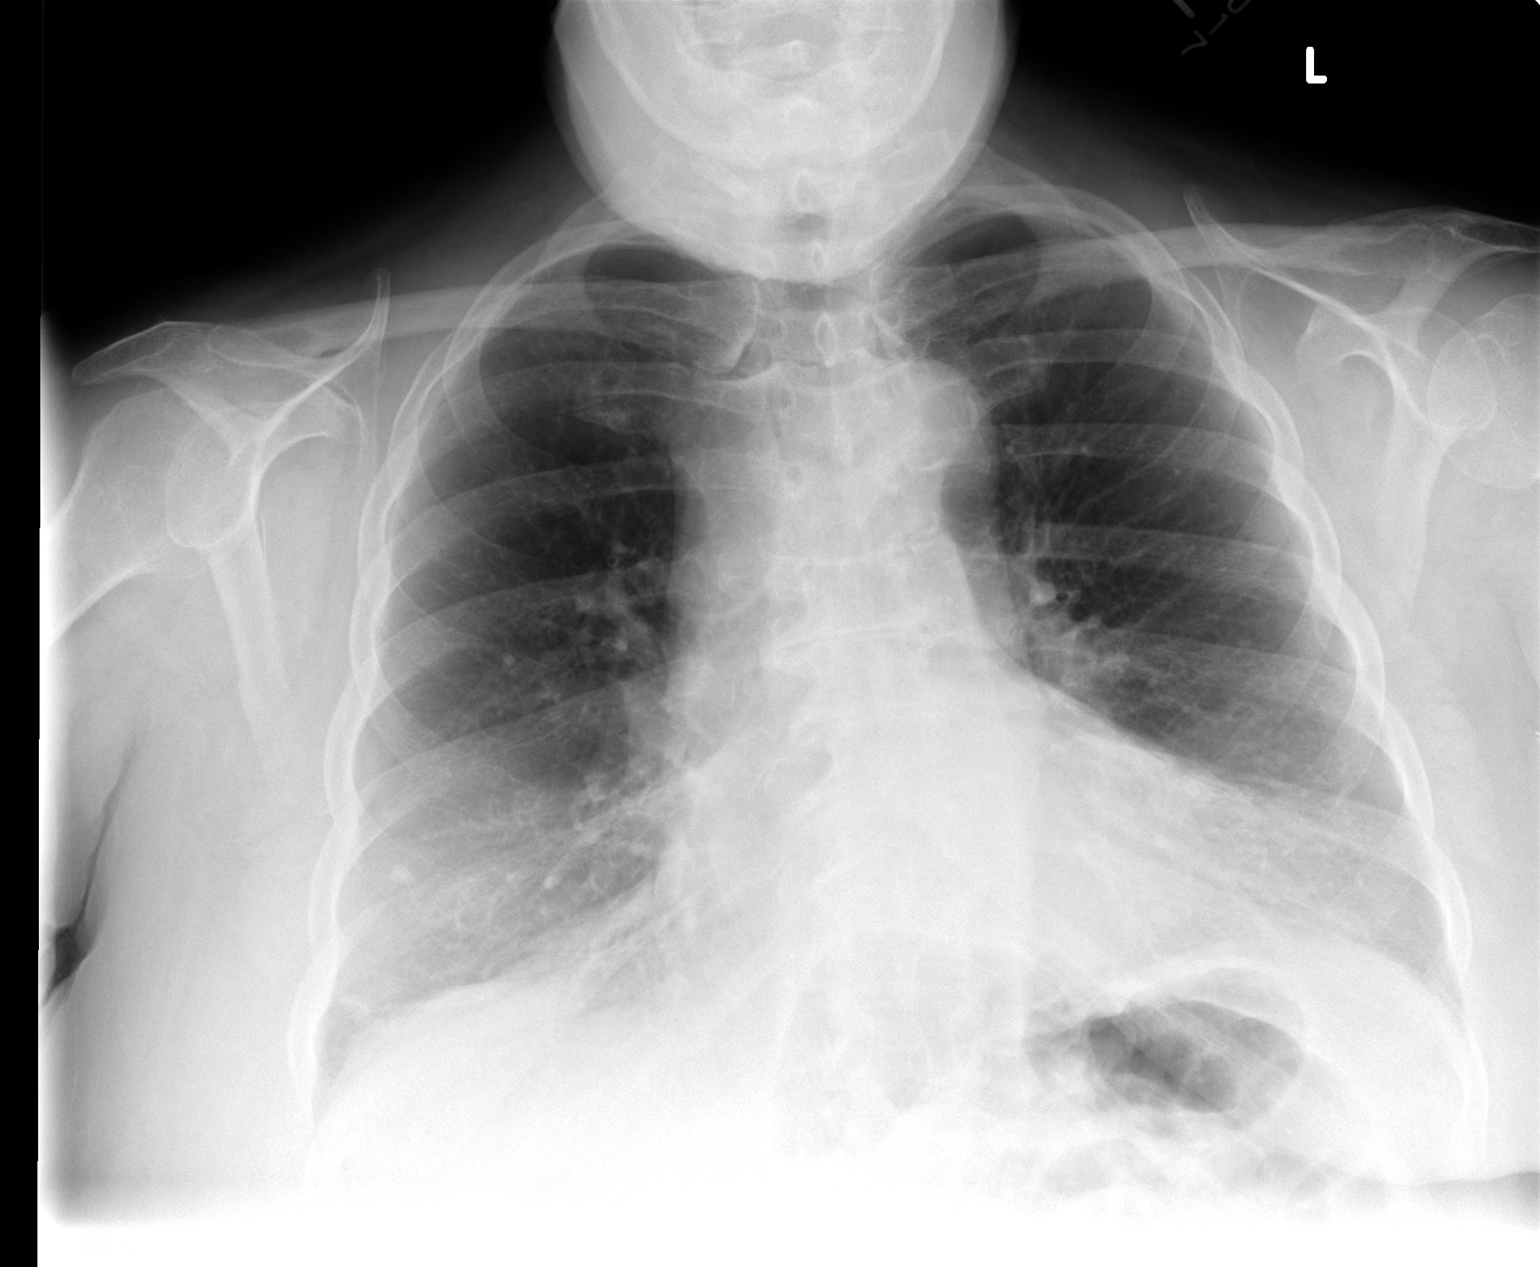

[view not recorded (2 of 2)]
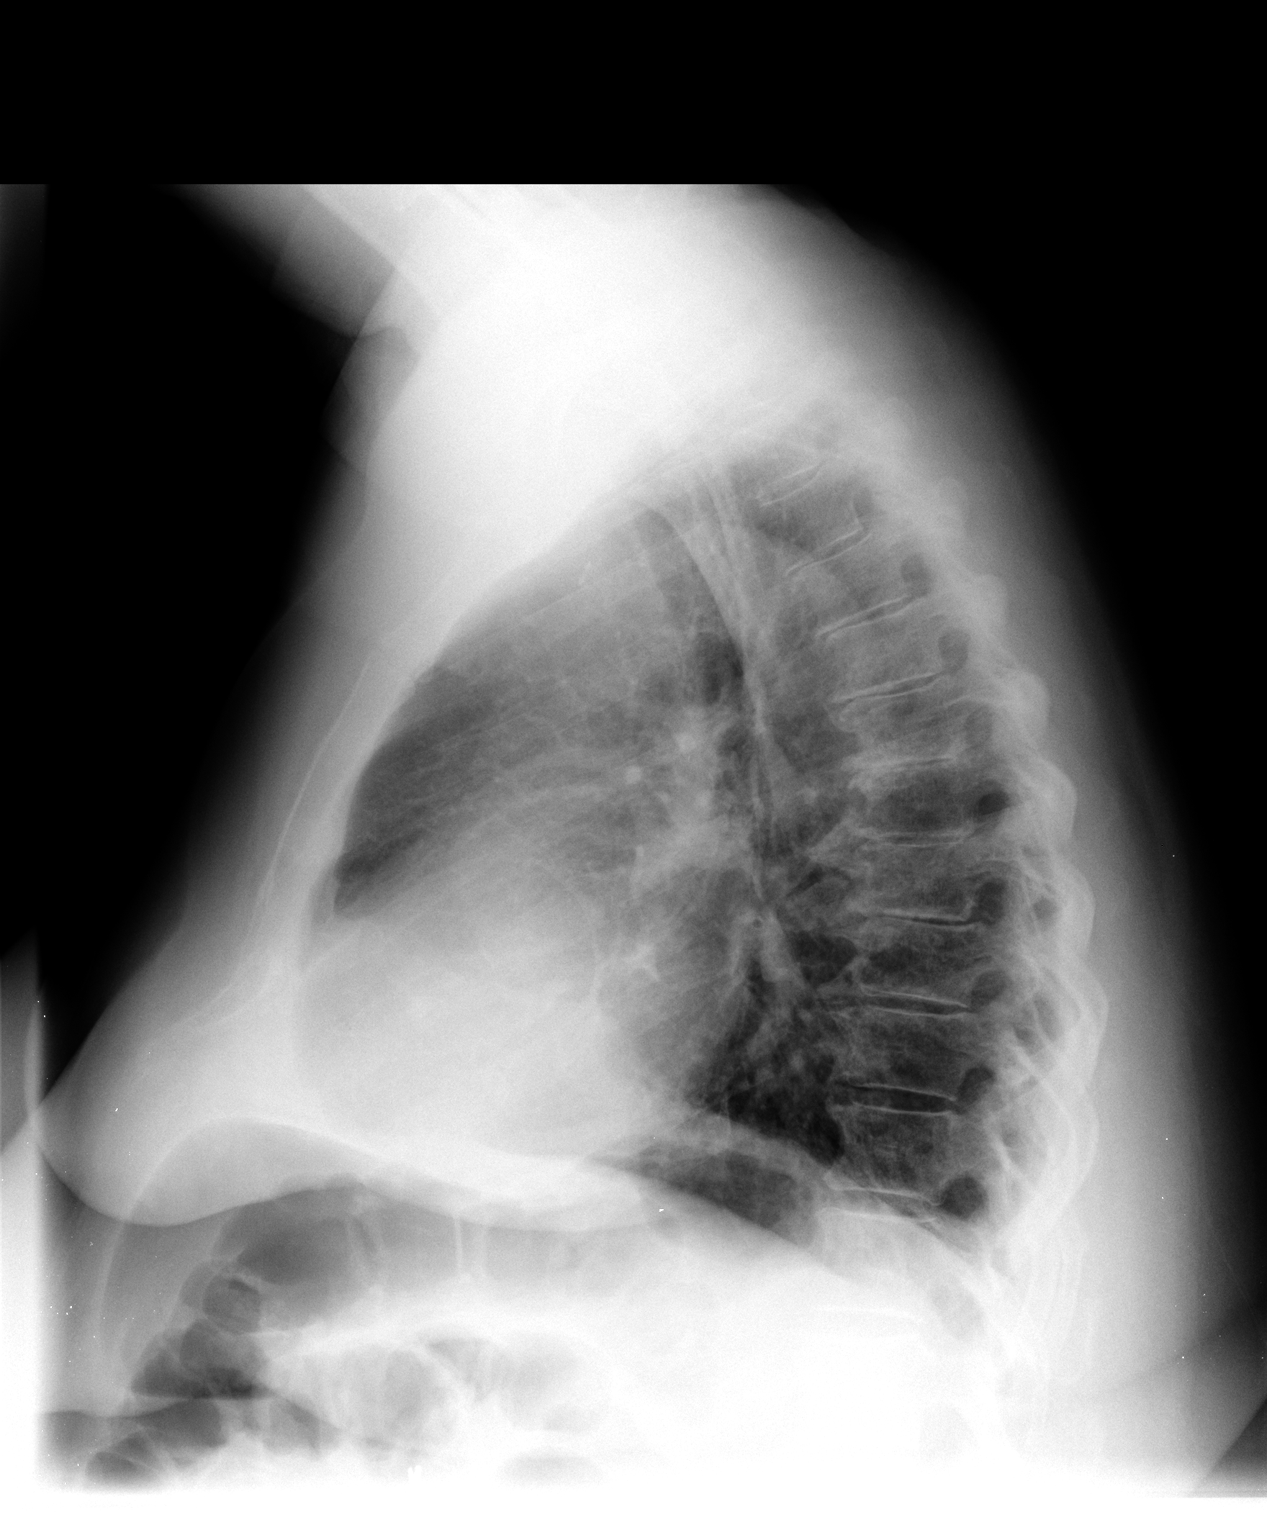

[2 of 2 positions shown; findings below may reference images not displayed]

FINDINGS: Stable cardiomediastinal silhouette.  No acute pulmonary
disease is noted.  Stable minimal bilateral basilar scarring.
Small calcified granuloma seen laterally in right lower lobe.  No
pneumothorax or pleural effusion is noted.  Bony thorax is intact.
IMPRESSION: No acute cardiopulmonary abnormality seen.

## 2014-09-28 DIAGNOSIS — Z471 Aftercare following joint replacement surgery: Secondary | ICD-10-CM | POA: Diagnosis not present

## 2014-09-28 DIAGNOSIS — F329 Major depressive disorder, single episode, unspecified: Secondary | ICD-10-CM | POA: Diagnosis not present

## 2014-09-28 DIAGNOSIS — F411 Generalized anxiety disorder: Secondary | ICD-10-CM | POA: Diagnosis not present

## 2014-09-28 DIAGNOSIS — E119 Type 2 diabetes mellitus without complications: Secondary | ICD-10-CM | POA: Diagnosis not present

## 2014-09-28 DIAGNOSIS — I10 Essential (primary) hypertension: Secondary | ICD-10-CM | POA: Diagnosis not present

## 2014-09-28 DIAGNOSIS — J449 Chronic obstructive pulmonary disease, unspecified: Secondary | ICD-10-CM | POA: Diagnosis not present

## 2014-10-02 ENCOUNTER — Other Ambulatory Visit: Payer: Self-pay | Admitting: Family Medicine

## 2014-10-02 DIAGNOSIS — E785 Hyperlipidemia, unspecified: Secondary | ICD-10-CM | POA: Diagnosis not present

## 2014-10-02 DIAGNOSIS — I10 Essential (primary) hypertension: Secondary | ICD-10-CM | POA: Diagnosis not present

## 2014-10-02 DIAGNOSIS — J449 Chronic obstructive pulmonary disease, unspecified: Secondary | ICD-10-CM | POA: Diagnosis not present

## 2014-10-02 DIAGNOSIS — R7309 Other abnormal glucose: Secondary | ICD-10-CM | POA: Diagnosis not present

## 2014-10-02 DIAGNOSIS — Z471 Aftercare following joint replacement surgery: Secondary | ICD-10-CM | POA: Diagnosis not present

## 2014-10-02 DIAGNOSIS — F411 Generalized anxiety disorder: Secondary | ICD-10-CM | POA: Diagnosis not present

## 2014-10-02 DIAGNOSIS — F329 Major depressive disorder, single episode, unspecified: Secondary | ICD-10-CM | POA: Diagnosis not present

## 2014-10-02 DIAGNOSIS — E119 Type 2 diabetes mellitus without complications: Secondary | ICD-10-CM | POA: Diagnosis not present

## 2014-10-02 LAB — BASIC METABOLIC PANEL
BUN: 13 mg/dL (ref 6–23)
CO2: 28 mEq/L (ref 19–32)
Calcium: 9.7 mg/dL (ref 8.4–10.5)
Chloride: 102 mEq/L (ref 96–112)
Creat: 0.69 mg/dL (ref 0.50–1.10)
Glucose, Bld: 114 mg/dL — ABNORMAL HIGH (ref 70–99)
POTASSIUM: 4.9 meq/L (ref 3.5–5.3)
Sodium: 138 mEq/L (ref 135–145)

## 2014-10-02 LAB — HEMOGLOBIN A1C
HEMOGLOBIN A1C: 6.1 % — AB (ref <5.7)
MEAN PLASMA GLUCOSE: 128 mg/dL — AB (ref <117)

## 2014-10-03 DIAGNOSIS — I10 Essential (primary) hypertension: Secondary | ICD-10-CM | POA: Diagnosis not present

## 2014-10-03 DIAGNOSIS — E119 Type 2 diabetes mellitus without complications: Secondary | ICD-10-CM | POA: Diagnosis not present

## 2014-10-03 DIAGNOSIS — J449 Chronic obstructive pulmonary disease, unspecified: Secondary | ICD-10-CM | POA: Diagnosis not present

## 2014-10-03 DIAGNOSIS — Z471 Aftercare following joint replacement surgery: Secondary | ICD-10-CM | POA: Diagnosis not present

## 2014-10-03 DIAGNOSIS — F411 Generalized anxiety disorder: Secondary | ICD-10-CM | POA: Diagnosis not present

## 2014-10-03 DIAGNOSIS — F329 Major depressive disorder, single episode, unspecified: Secondary | ICD-10-CM | POA: Diagnosis not present

## 2014-10-04 ENCOUNTER — Encounter: Payer: Self-pay | Admitting: Family Medicine

## 2014-10-04 ENCOUNTER — Ambulatory Visit (INDEPENDENT_AMBULATORY_CARE_PROVIDER_SITE_OTHER): Payer: MEDICARE | Admitting: Family Medicine

## 2014-10-04 VITALS — BP 108/74 | HR 88 | Resp 18 | Ht <= 58 in | Wt 220.1 lb

## 2014-10-04 DIAGNOSIS — F329 Major depressive disorder, single episode, unspecified: Secondary | ICD-10-CM

## 2014-10-04 DIAGNOSIS — I10 Essential (primary) hypertension: Secondary | ICD-10-CM

## 2014-10-04 DIAGNOSIS — Z23 Encounter for immunization: Secondary | ICD-10-CM | POA: Diagnosis not present

## 2014-10-04 DIAGNOSIS — Z471 Aftercare following joint replacement surgery: Secondary | ICD-10-CM | POA: Diagnosis not present

## 2014-10-04 DIAGNOSIS — E785 Hyperlipidemia, unspecified: Secondary | ICD-10-CM

## 2014-10-04 DIAGNOSIS — R7303 Prediabetes: Secondary | ICD-10-CM

## 2014-10-04 DIAGNOSIS — F411 Generalized anxiety disorder: Secondary | ICD-10-CM | POA: Diagnosis not present

## 2014-10-04 DIAGNOSIS — F17211 Nicotine dependence, cigarettes, in remission: Secondary | ICD-10-CM

## 2014-10-04 DIAGNOSIS — G4700 Insomnia, unspecified: Secondary | ICD-10-CM | POA: Insufficient documentation

## 2014-10-04 DIAGNOSIS — R7309 Other abnormal glucose: Secondary | ICD-10-CM

## 2014-10-04 DIAGNOSIS — E119 Type 2 diabetes mellitus without complications: Secondary | ICD-10-CM | POA: Diagnosis not present

## 2014-10-04 DIAGNOSIS — F32A Depression, unspecified: Secondary | ICD-10-CM

## 2014-10-04 DIAGNOSIS — J449 Chronic obstructive pulmonary disease, unspecified: Secondary | ICD-10-CM | POA: Diagnosis not present

## 2014-10-04 MED ORDER — TEMAZEPAM 15 MG PO CAPS: 15.0000 mg | ORAL_CAPSULE | Freq: Every evening | ORAL | Status: DC | PRN

## 2014-10-04 NOTE — Assessment & Plan Note (Signed)
Controlled, no change in medication D/c furosemide due to c/o excess urination, if pt develops leg selling or CHF symptoms she will resume

## 2014-10-04 NOTE — Assessment & Plan Note (Signed)
Patient educated about the importance of limiting  Carbohydrate intake , the need to commit to daily physical activity for a minimum of 30 minutes , and to commit weight loss. The fact that changes in all these areas will reduce or eliminate all together the development of diabetes is stressed.   Updated lab needed at/ before next visit.  

## 2014-10-04 NOTE — Progress Notes (Signed)
   Subjective:    Patient ID: Adrienne Nelson, female    DOB: 1951-07-07, 63 y.o.   MRN: [id number]  HPI The PT is here for follow up and re-evaluation of chronic medical conditions, medication management and review of any available recent lab and radiology data.  Preventive health is updated, specifically  Cancer screening and Immunization.   Had successful total right knee replacement 09/05/2014, no longer in uncontrolled pain and up and moving around where she had been unable to in the recent past since 8 months ago Has not smoked since 09/04/2014, and states she will never do this again Chronic pain management is currently from ortho Doc The PT states she was told "never to use trazodone again" and is requesting sleep med   Has script from hospital/rehab, but states she gets nightmares with this Sleep hygiene is poor, keeps on sound and light and will work on this C/o excessive urination with lasix , wants to d/c , denies pNd or orthopnea, will try to taper off      Review of Systems See HPI Denies recent fever or chills. Denies sinus pressure, nasal congestion, ear pain or sore throat. Denies chest congestion, productive cough or wheezing. Denies chest pains, palpitations and leg swelling Denies abdominal pain, nausea, vomiting,diarrhea or constipation.   Denies dysuria, frequency, hesitancy or incontinence. Denies uncontrolled  joint pain, swelling and limitation in mobility. Denies headaches, seizures, numbness, or tingling. Denies depression, anxiety , does have significant  insomnia. Denies skin break down or rash.        Objective:   Physical Exam  BP 108/74  Pulse 88  Resp 18  Ht 4\' 9"  (1.448 m)  Wt 220 lb 1.3 oz (99.828 kg)  BMI 47.61 kg/m2  SpO2 94% Patient alert and oriented and in no cardiopulmonary distress.  HEENT: No facial asymmetry, EOMI,   oropharynx pink and moist.  Neck supple no JVD, no mass.  Chest: Clear to auscultation bilaterally.  CVS:  S1, S2 no murmurs, no S3.Regular rate.  ABD: Soft non tender.   Ext: No edema  MS: Decreased though adequate  ROM spine, shoulders, hips and knees.  Skin: Intact, no ulcerations or rash noted.Right knee scar well healed  Psych: Good eye contact, normal affect. Memory intact not anxious or depressed appearing.  CNS: CN 2-12 intact, power,  normal throughout.no focal deficits noted.       Assessment & Plan:  Essential hypertension Controlled, no change in medication D/c furosemide due to c/o excess urination, if pt develops leg selling or CHF symptoms she will resume  Prediabetes Patient educated about the importance of limiting  Carbohydrate intake , the need to commit to daily physical activity for a minimum of 30 minutes , and to commit weight loss. The fact that changes in all these areas will reduce or eliminate all together the development of diabetes is stressed.   Updated lab needed at/ before next visit.   Hyperlipidemia LDL goal <100 Hyperlipidemia:Low fat diet discussed and encouraged.  Updated lab will be added   Insomnia uncontrolled off trazodone  Sleep hygiene reviewed and written information offered also. Prescription sent for  medication needed.   Depression Improved since she has had successful surgery and is relatively pain free  Need for prophylactic vaccination and inoculation against influenza Vaccine administered at visit.

## 2014-10-04 NOTE — Assessment & Plan Note (Signed)
Improved since she has had successful surgery and is relatively pain free

## 2014-10-04 NOTE — Assessment & Plan Note (Signed)
Hyperlipidemia:Low fat diet discussed and encouraged.  Updated lab will be added

## 2014-10-04 NOTE — Assessment & Plan Note (Signed)
Vaccine administered at visit.  

## 2014-10-04 NOTE — Assessment & Plan Note (Signed)
uncontrolled off trazodone  Sleep hygiene reviewed and written information offered also. Prescription sent for  medication needed.

## 2014-10-04 NOTE — Patient Instructions (Addendum)
Annual wellness with prevnar, in 4.5 month, call if you need me before  Flu vaccine today   Reduce use of lasix and potassium to evry other day for  2 weeks, then 3 times weekly for 2 weeks , then stop completely If you have leg swelling , note increased shortness of breath or have to prop up your head when you lie down, you will need to continue taking the lasix every day as you currently are  Call if you have questions  Thankful that your surgery has been the great success that it is, and that you will NEVER smoke again  New for sleep is restoril, and it is VITAL that you turn off all light and sound during sleep, night light floor level for safety is OK  You will be contacted if cholesterol and liver tests are abnormal  You look as if you feel much better since your knee is replaced, that is GREAT!

## 2014-10-05 ENCOUNTER — Encounter: Payer: Self-pay | Admitting: Family Medicine

## 2014-10-05 LAB — HEPATIC FUNCTION PANEL
ALT: 13 U/L (ref 0–35)
AST: 17 U/L (ref 0–37)
Albumin: 4.1 g/dL (ref 3.5–5.2)
Alkaline Phosphatase: 72 U/L (ref 39–117)
Bilirubin, Direct: 0.1 mg/dL (ref 0.0–0.3)
Indirect Bilirubin: 0.3 mg/dL (ref 0.2–1.2)
Total Bilirubin: 0.4 mg/dL (ref 0.2–1.2)
Total Protein: 6.2 g/dL (ref 6.0–8.3)

## 2014-10-05 LAB — LIPID PANEL
CHOL/HDL RATIO: 2.8 ratio
CHOLESTEROL: 180 mg/dL (ref 0–200)
HDL: 65 mg/dL (ref >39)
LDL Cholesterol: 82 mg/dL (ref 0–99)
TRIGLYCERIDES: 167 mg/dL — AB (ref <150)
VLDL: 33 mg/dL (ref 0–40)

## 2014-10-06 DIAGNOSIS — I10 Essential (primary) hypertension: Secondary | ICD-10-CM | POA: Diagnosis not present

## 2014-10-06 DIAGNOSIS — E119 Type 2 diabetes mellitus without complications: Secondary | ICD-10-CM | POA: Diagnosis not present

## 2014-10-06 DIAGNOSIS — F329 Major depressive disorder, single episode, unspecified: Secondary | ICD-10-CM | POA: Diagnosis not present

## 2014-10-06 DIAGNOSIS — J449 Chronic obstructive pulmonary disease, unspecified: Secondary | ICD-10-CM | POA: Diagnosis not present

## 2014-10-06 DIAGNOSIS — Z471 Aftercare following joint replacement surgery: Secondary | ICD-10-CM | POA: Diagnosis not present

## 2014-10-06 DIAGNOSIS — F411 Generalized anxiety disorder: Secondary | ICD-10-CM | POA: Diagnosis not present

## 2014-10-09 DIAGNOSIS — I10 Essential (primary) hypertension: Secondary | ICD-10-CM | POA: Diagnosis not present

## 2014-10-09 DIAGNOSIS — E119 Type 2 diabetes mellitus without complications: Secondary | ICD-10-CM | POA: Diagnosis not present

## 2014-10-09 DIAGNOSIS — Z471 Aftercare following joint replacement surgery: Secondary | ICD-10-CM | POA: Diagnosis not present

## 2014-10-09 DIAGNOSIS — J449 Chronic obstructive pulmonary disease, unspecified: Secondary | ICD-10-CM | POA: Diagnosis not present

## 2014-10-09 DIAGNOSIS — F411 Generalized anxiety disorder: Secondary | ICD-10-CM | POA: Diagnosis not present

## 2014-10-09 DIAGNOSIS — F329 Major depressive disorder, single episode, unspecified: Secondary | ICD-10-CM | POA: Diagnosis not present

## 2014-10-10 DIAGNOSIS — J449 Chronic obstructive pulmonary disease, unspecified: Secondary | ICD-10-CM | POA: Diagnosis not present

## 2014-10-10 DIAGNOSIS — F329 Major depressive disorder, single episode, unspecified: Secondary | ICD-10-CM | POA: Diagnosis not present

## 2014-10-10 DIAGNOSIS — E119 Type 2 diabetes mellitus without complications: Secondary | ICD-10-CM | POA: Diagnosis not present

## 2014-10-10 DIAGNOSIS — F411 Generalized anxiety disorder: Secondary | ICD-10-CM | POA: Diagnosis not present

## 2014-10-10 DIAGNOSIS — I10 Essential (primary) hypertension: Secondary | ICD-10-CM | POA: Diagnosis not present

## 2014-10-10 DIAGNOSIS — Z471 Aftercare following joint replacement surgery: Secondary | ICD-10-CM | POA: Diagnosis not present

## 2014-10-16 DIAGNOSIS — F411 Generalized anxiety disorder: Secondary | ICD-10-CM | POA: Diagnosis not present

## 2014-10-16 DIAGNOSIS — E119 Type 2 diabetes mellitus without complications: Secondary | ICD-10-CM | POA: Diagnosis not present

## 2014-10-16 DIAGNOSIS — F329 Major depressive disorder, single episode, unspecified: Secondary | ICD-10-CM | POA: Diagnosis not present

## 2014-10-16 DIAGNOSIS — I10 Essential (primary) hypertension: Secondary | ICD-10-CM | POA: Diagnosis not present

## 2014-10-16 DIAGNOSIS — J449 Chronic obstructive pulmonary disease, unspecified: Secondary | ICD-10-CM | POA: Diagnosis not present

## 2014-10-16 DIAGNOSIS — Z471 Aftercare following joint replacement surgery: Secondary | ICD-10-CM | POA: Diagnosis not present

## 2014-10-17 DIAGNOSIS — F329 Major depressive disorder, single episode, unspecified: Secondary | ICD-10-CM | POA: Diagnosis not present

## 2014-10-17 DIAGNOSIS — Z471 Aftercare following joint replacement surgery: Secondary | ICD-10-CM | POA: Diagnosis not present

## 2014-10-17 DIAGNOSIS — I10 Essential (primary) hypertension: Secondary | ICD-10-CM | POA: Diagnosis not present

## 2014-10-17 DIAGNOSIS — M7061 Trochanteric bursitis, right hip: Secondary | ICD-10-CM | POA: Diagnosis not present

## 2014-10-17 DIAGNOSIS — M5431 Sciatica, right side: Secondary | ICD-10-CM | POA: Diagnosis not present

## 2014-10-17 DIAGNOSIS — Z96659 Presence of unspecified artificial knee joint: Secondary | ICD-10-CM | POA: Diagnosis not present

## 2014-10-17 DIAGNOSIS — E119 Type 2 diabetes mellitus without complications: Secondary | ICD-10-CM | POA: Diagnosis not present

## 2014-10-17 DIAGNOSIS — F411 Generalized anxiety disorder: Secondary | ICD-10-CM | POA: Diagnosis not present

## 2014-10-17 DIAGNOSIS — J449 Chronic obstructive pulmonary disease, unspecified: Secondary | ICD-10-CM | POA: Diagnosis not present

## 2014-10-19 ENCOUNTER — Telehealth: Payer: Self-pay | Admitting: Family Medicine

## 2014-10-19 DIAGNOSIS — J449 Chronic obstructive pulmonary disease, unspecified: Secondary | ICD-10-CM | POA: Diagnosis not present

## 2014-10-19 NOTE — Telephone Encounter (Signed)
pls let pt know that recent overnight p pulse ox study shows that she does need supplemental oxygen while asleep , needs humidified oxygen at 2L/min for use while asleep ordered for 12 months, I will sign , study report available to you

## 2014-10-20 ENCOUNTER — Telehealth: Payer: Self-pay | Admitting: Family Medicine

## 2014-10-20 DIAGNOSIS — E119 Type 2 diabetes mellitus without complications: Secondary | ICD-10-CM | POA: Diagnosis not present

## 2014-10-20 DIAGNOSIS — J449 Chronic obstructive pulmonary disease, unspecified: Secondary | ICD-10-CM | POA: Diagnosis not present

## 2014-10-20 DIAGNOSIS — I10 Essential (primary) hypertension: Secondary | ICD-10-CM | POA: Diagnosis not present

## 2014-10-20 DIAGNOSIS — F411 Generalized anxiety disorder: Secondary | ICD-10-CM | POA: Diagnosis not present

## 2014-10-20 DIAGNOSIS — Z471 Aftercare following joint replacement surgery: Secondary | ICD-10-CM | POA: Diagnosis not present

## 2014-10-20 DIAGNOSIS — F329 Major depressive disorder, single episode, unspecified: Secondary | ICD-10-CM | POA: Diagnosis not present

## 2014-10-20 NOTE — Telephone Encounter (Signed)
BP check scheduled.

## 2014-10-20 NOTE — Telephone Encounter (Signed)
Arrange nurse bp check in 3 weeks please

## 2014-10-20 NOTE — Telephone Encounter (Signed)
Was normal at her most recent OV

## 2014-10-20 NOTE — Telephone Encounter (Signed)
Patient aware- doesn't want to use [employer]. Will fax info to another company

## 2014-10-24 DIAGNOSIS — J449 Chronic obstructive pulmonary disease, unspecified: Secondary | ICD-10-CM | POA: Diagnosis not present

## 2014-10-24 DIAGNOSIS — I10 Essential (primary) hypertension: Secondary | ICD-10-CM | POA: Diagnosis not present

## 2014-10-24 DIAGNOSIS — Z471 Aftercare following joint replacement surgery: Secondary | ICD-10-CM | POA: Diagnosis not present

## 2014-10-24 DIAGNOSIS — E119 Type 2 diabetes mellitus without complications: Secondary | ICD-10-CM

## 2014-10-25 ENCOUNTER — Telehealth: Payer: Self-pay

## 2014-10-25 NOTE — Telephone Encounter (Signed)
Will refax results when available from scanning.

## 2014-11-01 ENCOUNTER — Ambulatory Visit: Payer: MEDICARE

## 2014-11-01 VITALS — BP 140/92 | Wt 225.0 lb

## 2014-11-01 DIAGNOSIS — I10 Essential (primary) hypertension: Secondary | ICD-10-CM

## 2014-11-01 NOTE — Progress Notes (Signed)
Patient in for nurse visit for blood pressure eval.  Blood pressure taken manually in left arm with reading of 140/92.  Patient is currently on amlodipine for blood pressure.  Will consult with MD on adjustments needed.   Patient is also asking is sleeping aid can be changed.  And would like to know if she can be prescribed something for restless leg syndrome which runs in her family.

## 2014-11-02 ENCOUNTER — Other Ambulatory Visit: Payer: Self-pay

## 2014-11-02 ENCOUNTER — Telehealth: Payer: Self-pay | Admitting: Family Medicine

## 2014-11-02 MED ORDER — GABAPENTIN 300 MG PO CAPS: 300.0000 mg | ORAL_CAPSULE | Freq: Every day | ORAL | Status: DC

## 2014-11-02 MED ORDER — TEMAZEPAM 30 MG PO CAPS: 30.0000 mg | ORAL_CAPSULE | Freq: Every evening | ORAL | Status: DC | PRN

## 2014-11-02 NOTE — Telephone Encounter (Signed)
Increase restoril to 30mg  daily,I advise this rather than changing due to tolerance and less side effects  For restless gabapentin 300 mg one at bedtime plsms send both

## 2014-11-02 NOTE — Telephone Encounter (Signed)
Patient would like to know if she can have a change in insomnia med as well as an rx for something for restless leg syndrome.

## 2014-11-02 NOTE — Telephone Encounter (Signed)
Pt aware and med sent  

## 2014-11-06 ENCOUNTER — Telehealth: Payer: Self-pay

## 2014-11-06 MED ORDER — ROPINIROLE HCL 0.25 MG PO TABS: 0.2500 mg | ORAL_TABLET | Freq: Every day | ORAL | Status: DC

## 2014-11-06 NOTE — Telephone Encounter (Signed)
Patient aware, d/c order sent

## 2014-11-06 NOTE — Telephone Encounter (Signed)
Prescription sent in pls let her know, pls send d/c gabapentin msg to pharmacy

## 2014-11-10 ENCOUNTER — Telehealth: Payer: Self-pay | Admitting: Family Medicine

## 2014-11-10 MED ORDER — CLONIDINE HCL 0.1 MG PO TABS: 0.1000 mg | ORAL_TABLET | Freq: Every day | ORAL | Status: DC

## 2014-11-10 NOTE — Telephone Encounter (Addendum)
Late follow up on pt's BP on 11/01/2014. Pls add clonidine 0.1mg  one at bedtime to her current BP medication reigme, this will help with sleep and Blood pressure . Pls let her know and send in #30 with 3   Refills, pls mover her annual wellness up to Feb 4 or as soon after so I can check BP then, also, her wellness is due then

## 2014-11-10 NOTE — Addendum Note (Signed)
Addended by: Denman George B on: 11/10/2014 04:01 PM   Modules accepted: Orders

## 2014-11-10 NOTE — Telephone Encounter (Signed)
Med sent appointment changed and patient aware.

## 2014-11-15 ENCOUNTER — Telehealth: Payer: Self-pay

## 2014-11-15 MED ORDER — PREDNISONE 5 MG PO KIT: PACK | ORAL | Status: DC

## 2014-11-15 NOTE — Telephone Encounter (Signed)
Patient aware.  Med sent to pharmacy.  

## 2014-11-15 NOTE — Addendum Note (Signed)
Addended by: Denman George B on: 11/15/2014 10:42 AM   Modules accepted: Orders

## 2014-11-15 NOTE — Telephone Encounter (Signed)
pls send prednisone 5mg  dopse pack #21 only, tell her not to hurt herself pls

## 2014-11-28 DIAGNOSIS — Z96659 Presence of unspecified artificial knee joint: Secondary | ICD-10-CM | POA: Diagnosis not present

## 2014-11-28 DIAGNOSIS — M179 Osteoarthritis of knee, unspecified: Secondary | ICD-10-CM | POA: Diagnosis not present

## 2014-12-04 ENCOUNTER — Encounter: Payer: Self-pay | Admitting: Family Medicine

## 2014-12-04 ENCOUNTER — Ambulatory Visit (INDEPENDENT_AMBULATORY_CARE_PROVIDER_SITE_OTHER): Payer: MEDICARE | Admitting: Family Medicine

## 2014-12-04 VITALS — BP 162/94 | HR 93 | Resp 20 | Ht <= 58 in | Wt 223.1 lb

## 2014-12-04 DIAGNOSIS — Z23 Encounter for immunization: Secondary | ICD-10-CM

## 2014-12-04 DIAGNOSIS — M25551 Pain in right hip: Secondary | ICD-10-CM | POA: Diagnosis not present

## 2014-12-04 DIAGNOSIS — E785 Hyperlipidemia, unspecified: Secondary | ICD-10-CM | POA: Diagnosis not present

## 2014-12-04 DIAGNOSIS — R7309 Other abnormal glucose: Secondary | ICD-10-CM | POA: Diagnosis not present

## 2014-12-04 DIAGNOSIS — M25552 Pain in left hip: Secondary | ICD-10-CM

## 2014-12-04 DIAGNOSIS — F329 Major depressive disorder, single episode, unspecified: Secondary | ICD-10-CM

## 2014-12-04 DIAGNOSIS — E669 Obesity, unspecified: Secondary | ICD-10-CM

## 2014-12-04 DIAGNOSIS — G4700 Insomnia, unspecified: Secondary | ICD-10-CM

## 2014-12-04 DIAGNOSIS — M47896 Other spondylosis, lumbar region: Secondary | ICD-10-CM

## 2014-12-04 DIAGNOSIS — I10 Essential (primary) hypertension: Secondary | ICD-10-CM

## 2014-12-04 DIAGNOSIS — R7303 Prediabetes: Secondary | ICD-10-CM

## 2014-12-04 DIAGNOSIS — F32A Depression, unspecified: Secondary | ICD-10-CM

## 2014-12-04 MED ORDER — METHYLPREDNISOLONE ACETATE 80 MG/ML IJ SUSP
80.0000 mg | Freq: Once | INTRAMUSCULAR | Status: AC
Start: 2014-12-04 — End: 2014-12-04
  Administered 2014-12-04: 80 mg via INTRAMUSCULAR

## 2014-12-04 MED ORDER — PREDNISONE 5 MG PO TABS: 5.0000 mg | ORAL_TABLET | Freq: Two times a day (BID) | ORAL | Status: AC

## 2014-12-04 MED ORDER — CLONIDINE HCL 0.3 MG PO TABS: 0.3000 mg | ORAL_TABLET | Freq: Two times a day (BID) | ORAL | Status: DC

## 2014-12-04 MED ORDER — KETOROLAC TROMETHAMINE 60 MG/2ML IM SOLN
60.0000 mg | Freq: Once | INTRAMUSCULAR | Status: AC
  Administered 2014-12-04: 60 mg via INTRAMUSCULAR

## 2014-12-04 NOTE — Assessment & Plan Note (Signed)
Vaccine administered at visit.  

## 2014-12-04 NOTE — Assessment & Plan Note (Signed)
Uncontrolled, increase clonidine to 0.3mg  one at bedtime

## 2014-12-04 NOTE — Progress Notes (Signed)
   Subjective:    Patient ID: Adrienne Nelson, female    DOB: [birth year], 63 y.o.   MRN: [id number]  HPI The PT is here for follow up and re-evaluation of chronic medical conditions, medication management and review of any available recent lab and radiology data.  Preventive health is updated, specifically  Cancer screening and Immunization.   Questions or concerns regarding consultations or procedures which the PT has had in the interim are  addressed. The PT denies any adverse reactions to current medications since the last visit.  C/o increased bilaterl hip pain and uncontrolled back pain,  Sleep is also more of a problem and needs help wit this       Review of Systems See HPI Denies recent fever or chills. Denies sinus pressure, nasal congestion, ear pain or sore throat. Denies chest congestion, productive cough or wheezing. Denies chest pains, palpitations and leg swelling Denies abdominal pain, nausea, vomiting,diarrhea or constipation.   Denies dysuria, frequency, hesitancy or incontinence.  Denies headaches, seizures, numbness, or tingling. Denies  Uncontrolled depression or  anxiety is having significant difficulty with sleepa. Denies skin break down or rash.        Objective:   Physical Exam BP 162/94 mmHg  Pulse 93  Resp 20  Ht 4\' 9"  (1.448 m)  Wt 223 lb 1.9 oz (101.207 kg)  BMI 48.27 kg/m2  SpO2 93% Patient alert and oriented and in no cardiopulmonary distress.Pt in pain  HEENT: No facial asymmetry, EOMI,   oropharynx pink and moist.  Neck supple no JVD, no mass.  Chest: Clear to auscultation bilaterally.  CVS: S1, S2 no murmurs, no S3.Regular rate.  ABD: Soft non tender.   Ext: No edema  WC:[id number]  ROM spine, , hips and knees.  Skin: Intact, no ulcerations or rash noted.  Psych: Good eye contact, normal affect. Memory intact not anxious or depressed appearing.  CNS: CN 2-12 intact, power,  normal throughout.no focal deficits noted.       Assessment & Plan:  Essential hypertension Uncontrolled, increase clonidine to 0.3mg  one at bedtime   Need for vaccination with 13-polyvalent pneumococcal conjugate vaccine Vaccine administered at visit.    Bilateral hip pain Deteriorate and uncontrolled Anti inflammatories i office and then orally   Insomnia Uncontrolled, hopefully this will improve with hjgher dose of cloniodine Sleep hygiene reviewed and written information offered also. Prescription sent for  medication needed.    Hyperlipidemia LDL goal <100 Triglycerides are elevated, labs are otherwise good Hyperlipidemia:Low fat diet discussed and encouraged.Hyperlipidemia:Low fat diet discussed and encouraged.       Depression Controlled, no change in medication    Prediabetes Unchanged Patient educated about the importance of limiting  Carbohydrate intake , the need to commit to daily physical activity for a minimum of 30 minutes , and to commit weight loss. The fact that changes in all these areas will reduce or eliminate all together the development of diabetes is stressed.      Obesity Improved. Pt applauded on succesful weight loss through lifestyle change, and encouraged to continue same. Weight loss goal set for the next several months.    Lumbar spondylosis Increased pain, will increase dose of pain med, may need referral to pain clinic, she has been before and will call in if needed

## 2014-12-04 NOTE — Patient Instructions (Addendum)
F/u as before  Fasting lipid, cmp and EGFR, hBA1C and TSH for f/u appt pls  Prevnar today  Toradol and depo medrol today for hip pain and 5 day course of prednisone prescribed  Pain contract today, you may collect  Script for pain meds from here when next due , one twice daily  BP is high,new dose of  clonidine is 0.84m one at bedtime (OK to take THREE 0.182mtabs at one time till done)  Call for referral to Dr O Francesco Runnerf you feel you need epidural injections  Use restoril that you have for sleep with the higher dose of clonidine hopefully you will sleep

## 2014-12-06 ENCOUNTER — Other Ambulatory Visit: Payer: Self-pay | Admitting: Family Medicine

## 2014-12-18 ENCOUNTER — Other Ambulatory Visit: Payer: Self-pay

## 2014-12-18 DIAGNOSIS — IMO0002 Reserved for concepts with insufficient information to code with codable children: Principal | ICD-10-CM

## 2014-12-18 DIAGNOSIS — M1710 Unilateral primary osteoarthritis, unspecified knee: Secondary | ICD-10-CM

## 2014-12-18 MED ORDER — HYDROCODONE-ACETAMINOPHEN 10-325 MG PO TABS
ORAL_TABLET | ORAL | Status: DC
Start: 2014-12-18 — End: 2015-01-11

## 2014-12-22 HISTORY — PX: MENISCUS REPAIR: SHX5179

## 2015-01-11 ENCOUNTER — Other Ambulatory Visit: Payer: Self-pay

## 2015-01-11 DIAGNOSIS — M1710 Unilateral primary osteoarthritis, unspecified knee: Secondary | ICD-10-CM

## 2015-01-11 DIAGNOSIS — IMO0002 Reserved for concepts with insufficient information to code with codable children: Principal | ICD-10-CM

## 2015-01-11 MED ORDER — HYDROCODONE-ACETAMINOPHEN 10-325 MG PO TABS: ORAL_TABLET | ORAL | Status: DC

## 2015-01-18 ENCOUNTER — Telehealth: Payer: Self-pay | Admitting: *Deleted

## 2015-01-18 NOTE — Telephone Encounter (Signed)
Is having worsening pain since Sunday with her ribs that are pressing on a nerve. She can't get an appt with [facility] until Feb 17 at 11:30. She wants to know if you would allow her to take 3 hydrocodone a day. 1 in the am, afternoon and at bedtime because the pain isn't letting up. Please advise

## 2015-01-18 NOTE — Telephone Encounter (Signed)
Pt called to speak with the nurse. Please advise [phone]

## 2015-01-18 NOTE — Telephone Encounter (Signed)
pls print script for 3 times daily starting 01/29 until Feb 17 , only, after that on feb 18 she goes back to twice daily, I will sign

## 2015-01-19 NOTE — Telephone Encounter (Signed)
Patient aware.

## 2015-01-21 ENCOUNTER — Other Ambulatory Visit: Payer: Self-pay | Admitting: Family Medicine

## 2015-01-25 ENCOUNTER — Encounter: Payer: MEDICARE | Admitting: Family Medicine

## 2015-02-05 ENCOUNTER — Other Ambulatory Visit: Payer: Self-pay | Admitting: Family Medicine

## 2015-02-06 DIAGNOSIS — M791 Myalgia: Secondary | ICD-10-CM | POA: Diagnosis not present

## 2015-02-06 DIAGNOSIS — M179 Osteoarthritis of knee, unspecified: Secondary | ICD-10-CM | POA: Diagnosis not present

## 2015-02-06 DIAGNOSIS — M47816 Spondylosis without myelopathy or radiculopathy, lumbar region: Secondary | ICD-10-CM | POA: Diagnosis not present

## 2015-02-06 DIAGNOSIS — M47812 Spondylosis without myelopathy or radiculopathy, cervical region: Secondary | ICD-10-CM | POA: Diagnosis not present

## 2015-02-07 ENCOUNTER — Telehealth: Payer: Self-pay | Admitting: Family Medicine

## 2015-02-07 ENCOUNTER — Other Ambulatory Visit: Payer: Self-pay | Admitting: Family Medicine

## 2015-02-07 MED ORDER — SUVOREXANT 5 MG PO TABS: 1.0000 | ORAL_TABLET | Freq: Every day | ORAL | Status: DC

## 2015-02-07 NOTE — Telephone Encounter (Signed)
Patient aware and med sent  

## 2015-02-07 NOTE — Telephone Encounter (Signed)
  I will send belsomra, also remind her of good sleep hygiene, will re address at visit in 3/22,

## 2015-02-07 NOTE — Telephone Encounter (Signed)
Wants different sleep med due to cost. I told her trazodone was covered and she said she remembers a doctor telling her never to take that anymore with her breathing problems. She wants to know if there are any other options. Please advise

## 2015-02-09 ENCOUNTER — Other Ambulatory Visit: Payer: Self-pay

## 2015-02-09 DIAGNOSIS — M1710 Unilateral primary osteoarthritis, unspecified knee: Secondary | ICD-10-CM

## 2015-02-09 DIAGNOSIS — IMO0002 Reserved for concepts with insufficient information to code with codable children: Principal | ICD-10-CM

## 2015-02-09 MED ORDER — HYDROCODONE-ACETAMINOPHEN 10-325 MG PO TABS: ORAL_TABLET | ORAL | Status: DC

## 2015-02-17 NOTE — Assessment & Plan Note (Signed)
Deteriorate and uncontrolled Anti inflammatories i office and then orally

## 2015-02-17 NOTE — Assessment & Plan Note (Signed)
Unchanged Patient educated about the importance of limiting  Carbohydrate intake , the need to commit to daily physical activity for a minimum of 30 minutes , and to commit weight loss. The fact that changes in all these areas will reduce or eliminate all together the development of diabetes is stressed.    

## 2015-02-17 NOTE — Assessment & Plan Note (Signed)
Improved. Pt applauded on succesful weight loss through lifestyle change, and encouraged to continue same. Weight loss goal set for the next several months.  

## 2015-02-17 NOTE — Assessment & Plan Note (Signed)
Controlled, no change in medication  

## 2015-02-17 NOTE — Assessment & Plan Note (Signed)
Uncontrolled, hopefully this will improve with hjgher dose of cloniodine Sleep hygiene reviewed and written information offered also. Prescription sent for  medication needed.

## 2015-02-17 NOTE — Assessment & Plan Note (Signed)
Increased pain, will increase dose of pain med, may need referral to pain clinic, she has been before and will call in if needed

## 2015-02-17 NOTE — Assessment & Plan Note (Signed)
Triglycerides are elevated, labs are otherwise good Hyperlipidemia:Low fat diet discussed and encouraged.Hyperlipidemia:Low fat diet discussed and encouraged.

## 2015-03-07 ENCOUNTER — Other Ambulatory Visit: Payer: Self-pay | Admitting: Family Medicine

## 2015-03-07 DIAGNOSIS — M47812 Spondylosis without myelopathy or radiculopathy, cervical region: Secondary | ICD-10-CM | POA: Diagnosis not present

## 2015-03-07 DIAGNOSIS — G588 Other specified mononeuropathies: Secondary | ICD-10-CM | POA: Diagnosis not present

## 2015-03-07 DIAGNOSIS — M47816 Spondylosis without myelopathy or radiculopathy, lumbar region: Secondary | ICD-10-CM | POA: Diagnosis not present

## 2015-03-07 DIAGNOSIS — M791 Myalgia: Secondary | ICD-10-CM | POA: Diagnosis not present

## 2015-03-09 DIAGNOSIS — I10 Essential (primary) hypertension: Secondary | ICD-10-CM | POA: Diagnosis not present

## 2015-03-09 DIAGNOSIS — R7309 Other abnormal glucose: Secondary | ICD-10-CM | POA: Diagnosis not present

## 2015-03-09 DIAGNOSIS — E785 Hyperlipidemia, unspecified: Secondary | ICD-10-CM | POA: Diagnosis not present

## 2015-03-10 LAB — COMPLETE METABOLIC PANEL WITH GFR
ALT: 13 U/L (ref 0–35)
AST: 20 U/L (ref 0–37)
Albumin: 4.3 g/dL (ref 3.5–5.2)
Alkaline Phosphatase: 56 U/L (ref 39–117)
BUN: 11 mg/dL (ref 6–23)
CHLORIDE: 104 meq/L (ref 96–112)
CO2: 31 meq/L (ref 19–32)
Calcium: 10.1 mg/dL (ref 8.4–10.5)
Creat: 0.66 mg/dL (ref 0.50–1.10)
GFR, Est Non African American: >89 mL/min
Glucose, Bld: 120 mg/dL — ABNORMAL HIGH (ref 70–99)
Potassium: 4.4 mEq/L (ref 3.5–5.3)
Sodium: 144 mEq/L (ref 135–145)
Total Bilirubin: 0.3 mg/dL (ref 0.2–1.2)
Total Protein: 6.6 g/dL (ref 6.0–8.3)

## 2015-03-10 LAB — LIPID PANEL
Cholesterol: 167 mg/dL (ref 0–200)
HDL: 57 mg/dL (ref >=46)
LDL Cholesterol: 66 mg/dL (ref 0–99)
TRIGLYCERIDES: 218 mg/dL — AB (ref <150)
Total CHOL/HDL Ratio: 2.9 Ratio
VLDL: 44 mg/dL — ABNORMAL HIGH (ref 0–40)

## 2015-03-10 LAB — HEMOGLOBIN A1C
Hgb A1c MFr Bld: 6.3 % — ABNORMAL HIGH (ref <5.7)
MEAN PLASMA GLUCOSE: 134 mg/dL — AB (ref <117)

## 2015-03-10 LAB — TSH: TSH: 4.273 u[IU]/mL (ref 0.350–4.500)

## 2015-03-13 ENCOUNTER — Ambulatory Visit (INDEPENDENT_AMBULATORY_CARE_PROVIDER_SITE_OTHER): Payer: MEDICARE | Admitting: Family Medicine

## 2015-03-13 ENCOUNTER — Ambulatory Visit (HOSPITAL_COMMUNITY)
Admission: RE | Admit: 2015-03-13 | Discharge: 2015-03-13 | Disposition: A | Discharge status: Home / Self Care | Payer: Medicare Other | Source: Ambulatory Visit | Attending: Family Medicine | Admitting: Family Medicine

## 2015-03-13 ENCOUNTER — Encounter: Payer: Self-pay | Admitting: Family Medicine

## 2015-03-13 VITALS — BP 140/88 | HR 75 | Resp 16 | Ht <= 58 in | Wt 216.4 lb

## 2015-03-13 DIAGNOSIS — R7303 Prediabetes: Secondary | ICD-10-CM

## 2015-03-13 DIAGNOSIS — Z0000 Encounter for general adult medical examination without abnormal findings: Secondary | ICD-10-CM | POA: Diagnosis not present

## 2015-03-13 DIAGNOSIS — M545 Low back pain: Secondary | ICD-10-CM

## 2015-03-13 DIAGNOSIS — M25562 Pain in left knee: Secondary | ICD-10-CM | POA: Diagnosis not present

## 2015-03-13 DIAGNOSIS — F17218 Nicotine dependence, cigarettes, with other nicotine-induced disorders: Secondary | ICD-10-CM

## 2015-03-13 DIAGNOSIS — E785 Hyperlipidemia, unspecified: Secondary | ICD-10-CM

## 2015-03-13 DIAGNOSIS — M25552 Pain in left hip: Secondary | ICD-10-CM

## 2015-03-13 DIAGNOSIS — M1612 Unilateral primary osteoarthritis, left hip: Secondary | ICD-10-CM | POA: Diagnosis not present

## 2015-03-13 DIAGNOSIS — R7309 Other abnormal glucose: Secondary | ICD-10-CM

## 2015-03-13 DIAGNOSIS — M1712 Unilateral primary osteoarthritis, left knee: Secondary | ICD-10-CM | POA: Diagnosis not present

## 2015-03-13 DIAGNOSIS — M25551 Pain in right hip: Secondary | ICD-10-CM

## 2015-03-13 MED ORDER — BUPROPION HCL ER (SR) 150 MG PO TB12: 150.0000 mg | ORAL_TABLET | Freq: Two times a day (BID) | ORAL | Status: DC

## 2015-03-13 NOTE — Patient Instructions (Signed)
F/u in 3.5  month, call if you need me before  X rays of left hip and knee today ,  You are referred to Dr Francesco Runner for all pain management,  this is because your pain is UNCONTROLLED  Quit date set for April 5, start wellbutrin one daily for 3 days , then one twice daily  Call [facility] for help 24 hrs/ day, and we will give you info to help with quitting  Blood sugar and triglycerides have increased slightly sow make dietary changes a we discussed please  Hope you start feeling better soon!  Fasting lipid, cmp and EGFr and HBa1C in 3.5 month

## 2015-03-13 NOTE — Progress Notes (Signed)
Subjective:    Patient ID: Adrienne Nelson, female    DOB: 06-30-1951, 64 y.o.   MRN: [id number]  HPI Preventive Screening-Counseling & Management   Patient present here today for a Medicare annual wellness visit. 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 yes Over the past two weeks, have you felt little interest or pleasure in doing things? Unable to get around like she would like  Have you lost interest or pleasure in daily life? sometimes Do you often feel hopeless? No  Do you cry easily over simple problems? No   Activities of Daily Living  In your present state of health, do you have any difficulty performing the following activities?  Driving?: haven't drove in a few years  Managing money?: No Feeding yourself?:No Getting from bed to chair?:uses a cane  Climbing a flight of stairs?:cannot climb stairs  Preparing food and eating?:No Bathing or showering?: uses a shower chair  Getting dressed?:No Getting to the toilet?:no  Using the toilet?:No Moving around from place to  place?: has to take her time and walk with a cane   Fall Risk Assessment In the past year have you fallen or had a near fall?:No, but  High risk due to severe pain and instability in left hip and knee Are you currently taking any medications that make you dizzy?:No   Hearing Difficulties: yes , severe hearing loss Do you often ask people to speak up or repeat themselves?yes Do you experience ringing or noises in your ears?:No Do you have difficulty understanding soft or whispered voices?:yes  Cognitive Testing  Alert? Yes Normal Appearance?Yes  Oriented to person? Yes Place? Yes  Time? Yes  Displays appropriate judgment?Yes  Can read the correct time from a watch face? yes Are you having problems remembering things?No  Advanced Directives have been discussed with the patient?Yes, brochure given     List the Names of Other Physician/Practitioners you currently use:  Dr Aundria Rud ( pain specialist) Ortho ([place])  Dr Redmond Pulling (ENT)    Indicate any recent Medical Services you may have received from other than [facility] providers in the past year (date may be approximate).   Assessment:    Annual Wellness Exam   Plan:     Medicare Attestation  I have personally reviewed:  The patient's medical and social history  Their use of alcohol, tobacco or illicit drugs  Their current medications and supplements  The patient's functional ability including ADLs,fall risks, home safety risks, cognitive, and hearing and visual impairment  Diet and physical  activities  Evidence for depression or mood disorders  The patient's weight, height, BMI, and visual acuity have been recorded in the chart. I have made referrals, counseling, and provided education to the patient based on review of the above and I have provided the patient with a written personalized care plan for preventive services.      Review of Systems     Objective:   Physical Exam  BP 140/88 mmHg  Pulse 75  Resp 16  Ht 4'  9" (1.448 m)  Wt 216 lb 6.4 oz (98.158 kg)  BMI 46.82 kg/m2  SpO2 96%  HEENT: light reflex normal in both ears  MS: markedly reduced ROM lumbosacral spine, left knee with crepitus and deformity, abnormal gait , left hip movement limited by pain      Assessment & Plan:  Medicare annual wellness visit, subsequent Annual exam as documented. Counseling done  re healthy lifestyle involving commitment to 150 minutes exercise per week, heart healthy diet, and attaining healthy weight.The importance of adequate sleep also discussed. Regular seat belt use and home safety, is also discussed. Changes in health habits are decided on by the patient with goals and time frames  set for achieving them. Immunization and cancer screening needs are specifically addressed at this visit.    Lumbar back pain Uncontrolled and debilitating ongoing pain. Has had epidurals in the past , currently at the point where chronic pain management best taken over entirely by pain specialist who has been assisting in treating her , and she is in agreement, referral is made   Bilateral hip pain Increased left hip pain, with instability, xray of joint ordered at visit   Left knee pain Increased pain and instability, increasing fall risk. Xray ordered. \Home safety reviewed

## 2015-03-14 ENCOUNTER — Other Ambulatory Visit: Payer: Self-pay

## 2015-03-14 DIAGNOSIS — I10 Essential (primary) hypertension: Secondary | ICD-10-CM

## 2015-03-14 MED ORDER — CLONIDINE HCL 0.3 MG PO TABS: 0.3000 mg | ORAL_TABLET | Freq: Two times a day (BID) | ORAL | Status: DC

## 2015-03-14 MED ORDER — TEMAZEPAM 30 MG PO CAPS: 30.0000 mg | ORAL_CAPSULE | Freq: Every evening | ORAL | Status: DC | PRN

## 2015-03-14 MED ORDER — LISINOPRIL 40 MG PO TABS: 40.0000 mg | ORAL_TABLET | Freq: Every day | ORAL | Status: DC

## 2015-03-22 DIAGNOSIS — Z8589 Personal history of malignant neoplasm of other organs and systems: Secondary | ICD-10-CM | POA: Diagnosis not present

## 2015-03-22 DIAGNOSIS — M542 Cervicalgia: Secondary | ICD-10-CM | POA: Diagnosis not present

## 2015-03-25 DIAGNOSIS — M25562 Pain in left knee: Secondary | ICD-10-CM | POA: Insufficient documentation

## 2015-03-25 NOTE — Assessment & Plan Note (Signed)
Increased pain and instability, increasing fall risk. Xray ordered. \Home safety reviewed

## 2015-03-25 NOTE — Assessment & Plan Note (Signed)
Increased left hip pain, with instability, xray of joint ordered at visit

## 2015-03-25 NOTE — Assessment & Plan Note (Signed)

## 2015-03-25 NOTE — Assessment & Plan Note (Signed)
Uncontrolled and debilitating ongoing pain. Has had epidurals in the past , currently at the point where chronic pain management best taken over entirely by pain specialist who has been assisting in treating her , and she is in agreement, referral is made

## 2015-03-26 DIAGNOSIS — M542 Cervicalgia: Secondary | ICD-10-CM | POA: Diagnosis not present

## 2015-03-26 DIAGNOSIS — I6523 Occlusion and stenosis of bilateral carotid arteries: Secondary | ICD-10-CM | POA: Diagnosis not present

## 2015-03-26 DIAGNOSIS — M47812 Spondylosis without myelopathy or radiculopathy, cervical region: Secondary | ICD-10-CM | POA: Diagnosis not present

## 2015-03-26 DIAGNOSIS — H9202 Otalgia, left ear: Secondary | ICD-10-CM | POA: Diagnosis not present

## 2015-03-26 DIAGNOSIS — J029 Acute pharyngitis, unspecified: Secondary | ICD-10-CM | POA: Diagnosis not present

## 2015-03-26 DIAGNOSIS — M479 Spondylosis, unspecified: Secondary | ICD-10-CM | POA: Diagnosis not present

## 2015-03-29 ENCOUNTER — Telehealth: Payer: Self-pay | Admitting: Family Medicine

## 2015-03-29 NOTE — Telephone Encounter (Addendum)
Letter written , pls fax to his office

## 2015-03-29 NOTE — Telephone Encounter (Signed)
Spoke with Dr Beryle Quant office asking how do they do the taking over the pain management and the way he does it is the Dr requesting this will have to send a letter or a note requesting this and he will talk to the patient at her next visit and then he will decide. Patients next appointment with him is May 11,2016

## 2015-03-30 ENCOUNTER — Other Ambulatory Visit: Payer: Self-pay

## 2015-03-30 DIAGNOSIS — M1710 Unilateral primary osteoarthritis, unspecified knee: Secondary | ICD-10-CM

## 2015-03-30 DIAGNOSIS — IMO0002 Reserved for concepts with insufficient information to code with codable children: Principal | ICD-10-CM

## 2015-03-30 MED ORDER — HYDROCODONE-ACETAMINOPHEN 10-325 MG PO TABS: ORAL_TABLET | ORAL | Status: DC

## 2015-04-05 ENCOUNTER — Telehealth: Payer: Self-pay | Admitting: Family Medicine

## 2015-04-05 DIAGNOSIS — Z79891 Long term (current) use of opiate analgesic: Secondary | ICD-10-CM | POA: Diagnosis not present

## 2015-04-05 NOTE — Telephone Encounter (Signed)
Noted, still her appt is in May, I believe, so we will need to cover her pain meds  till the appt, pls verify that info

## 2015-04-05 NOTE — Telephone Encounter (Signed)
Yes the patient has an appointment in May 2016 with Dr Lyla Son

## 2015-04-05 NOTE — Telephone Encounter (Signed)
Noted  

## 2015-04-05 NOTE — Telephone Encounter (Signed)
Spoke with Larene Beach from Dr Ellouise Newer office she stated that Dr Lauree [place] office will call patient in and do a urine test and if everything is good Dr Francesco Runner will take over the RX

## 2015-04-10 DIAGNOSIS — M791 Myalgia: Secondary | ICD-10-CM | POA: Diagnosis not present

## 2015-04-10 DIAGNOSIS — Z87891 Personal history of nicotine dependence: Secondary | ICD-10-CM | POA: Diagnosis not present

## 2015-04-10 DIAGNOSIS — Z880 Allergy status to penicillin: Secondary | ICD-10-CM | POA: Diagnosis not present

## 2015-04-10 DIAGNOSIS — G588 Other specified mononeuropathies: Secondary | ICD-10-CM | POA: Diagnosis not present

## 2015-04-10 DIAGNOSIS — G8929 Other chronic pain: Secondary | ICD-10-CM | POA: Diagnosis not present

## 2015-04-10 DIAGNOSIS — I10 Essential (primary) hypertension: Secondary | ICD-10-CM | POA: Diagnosis not present

## 2015-04-10 DIAGNOSIS — G4700 Insomnia, unspecified: Secondary | ICD-10-CM | POA: Diagnosis not present

## 2015-04-10 DIAGNOSIS — Z7982 Long term (current) use of aspirin: Secondary | ICD-10-CM | POA: Diagnosis not present

## 2015-04-10 DIAGNOSIS — J449 Chronic obstructive pulmonary disease, unspecified: Secondary | ICD-10-CM | POA: Diagnosis not present

## 2015-04-10 DIAGNOSIS — M47812 Spondylosis without myelopathy or radiculopathy, cervical region: Secondary | ICD-10-CM | POA: Diagnosis not present

## 2015-04-10 DIAGNOSIS — Z79899 Other long term (current) drug therapy: Secondary | ICD-10-CM | POA: Diagnosis not present

## 2015-04-10 DIAGNOSIS — Z7951 Long term (current) use of inhaled steroids: Secondary | ICD-10-CM | POA: Diagnosis not present

## 2015-04-10 DIAGNOSIS — F419 Anxiety disorder, unspecified: Secondary | ICD-10-CM | POA: Diagnosis not present

## 2015-04-10 DIAGNOSIS — Z886 Allergy status to analgesic agent status: Secondary | ICD-10-CM | POA: Diagnosis not present

## 2015-04-10 DIAGNOSIS — F329 Major depressive disorder, single episode, unspecified: Secondary | ICD-10-CM | POA: Diagnosis not present

## 2015-04-10 DIAGNOSIS — E785 Hyperlipidemia, unspecified: Secondary | ICD-10-CM | POA: Diagnosis not present

## 2015-04-10 DIAGNOSIS — M179 Osteoarthritis of knee, unspecified: Secondary | ICD-10-CM | POA: Diagnosis not present

## 2015-04-10 DIAGNOSIS — M47816 Spondylosis without myelopathy or radiculopathy, lumbar region: Secondary | ICD-10-CM | POA: Diagnosis not present

## 2015-04-12 ENCOUNTER — Other Ambulatory Visit: Payer: Self-pay | Admitting: Family Medicine

## 2015-04-23 DIAGNOSIS — Z8589 Personal history of malignant neoplasm of other organs and systems: Secondary | ICD-10-CM | POA: Diagnosis not present

## 2015-04-23 DIAGNOSIS — M542 Cervicalgia: Secondary | ICD-10-CM | POA: Diagnosis not present

## 2015-04-26 ENCOUNTER — Other Ambulatory Visit: Payer: Self-pay

## 2015-04-26 DIAGNOSIS — M1710 Unilateral primary osteoarthritis, unspecified knee: Secondary | ICD-10-CM

## 2015-04-26 DIAGNOSIS — IMO0002 Reserved for concepts with insufficient information to code with codable children: Principal | ICD-10-CM

## 2015-04-26 MED ORDER — HYDROCODONE-ACETAMINOPHEN 10-325 MG PO TABS: ORAL_TABLET | ORAL | Status: DC

## 2015-05-16 IMAGING — CR DG CHEST 2V
2 series · 2 of 2 positions shown · non-contrast
Comparison: 05/23/2013

CLINICAL DATA: Cough and congestion.  Shortness of breath

EXAM:
CHEST  2 VIEW

[view not recorded (1 of 2)]
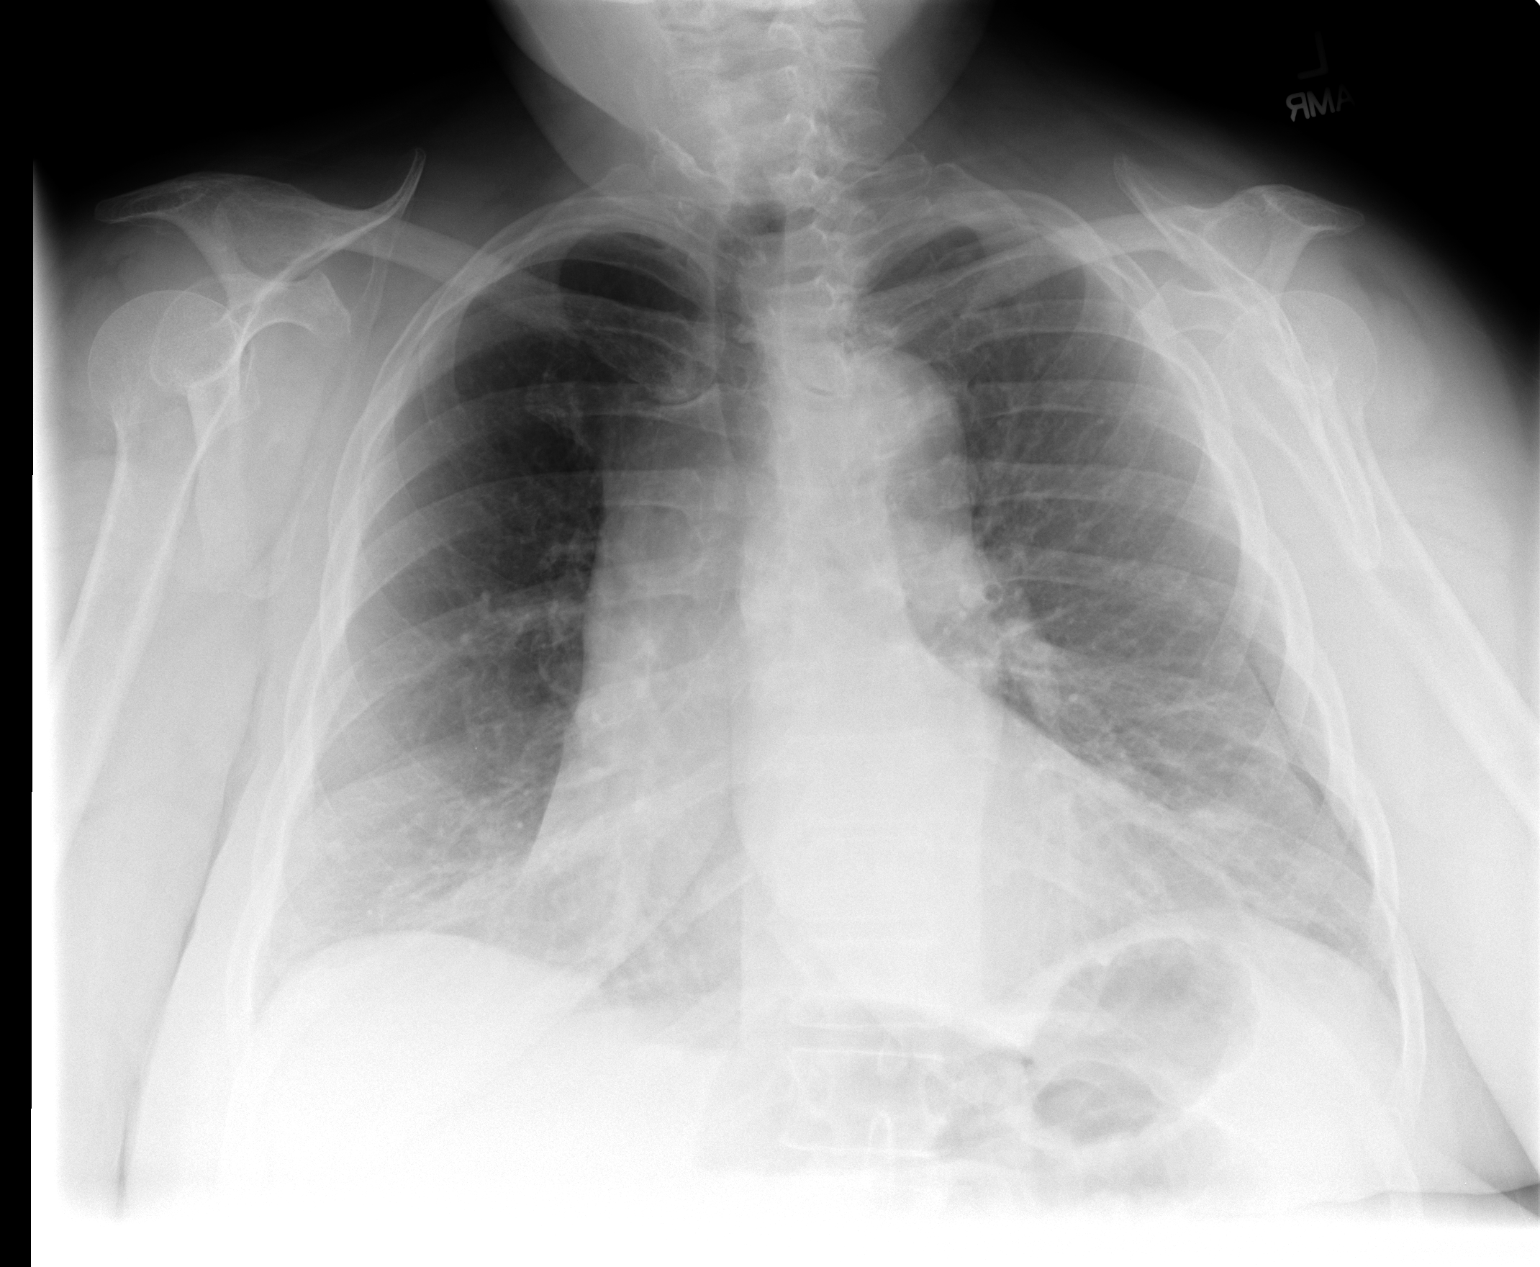

[view not recorded (2 of 2)]
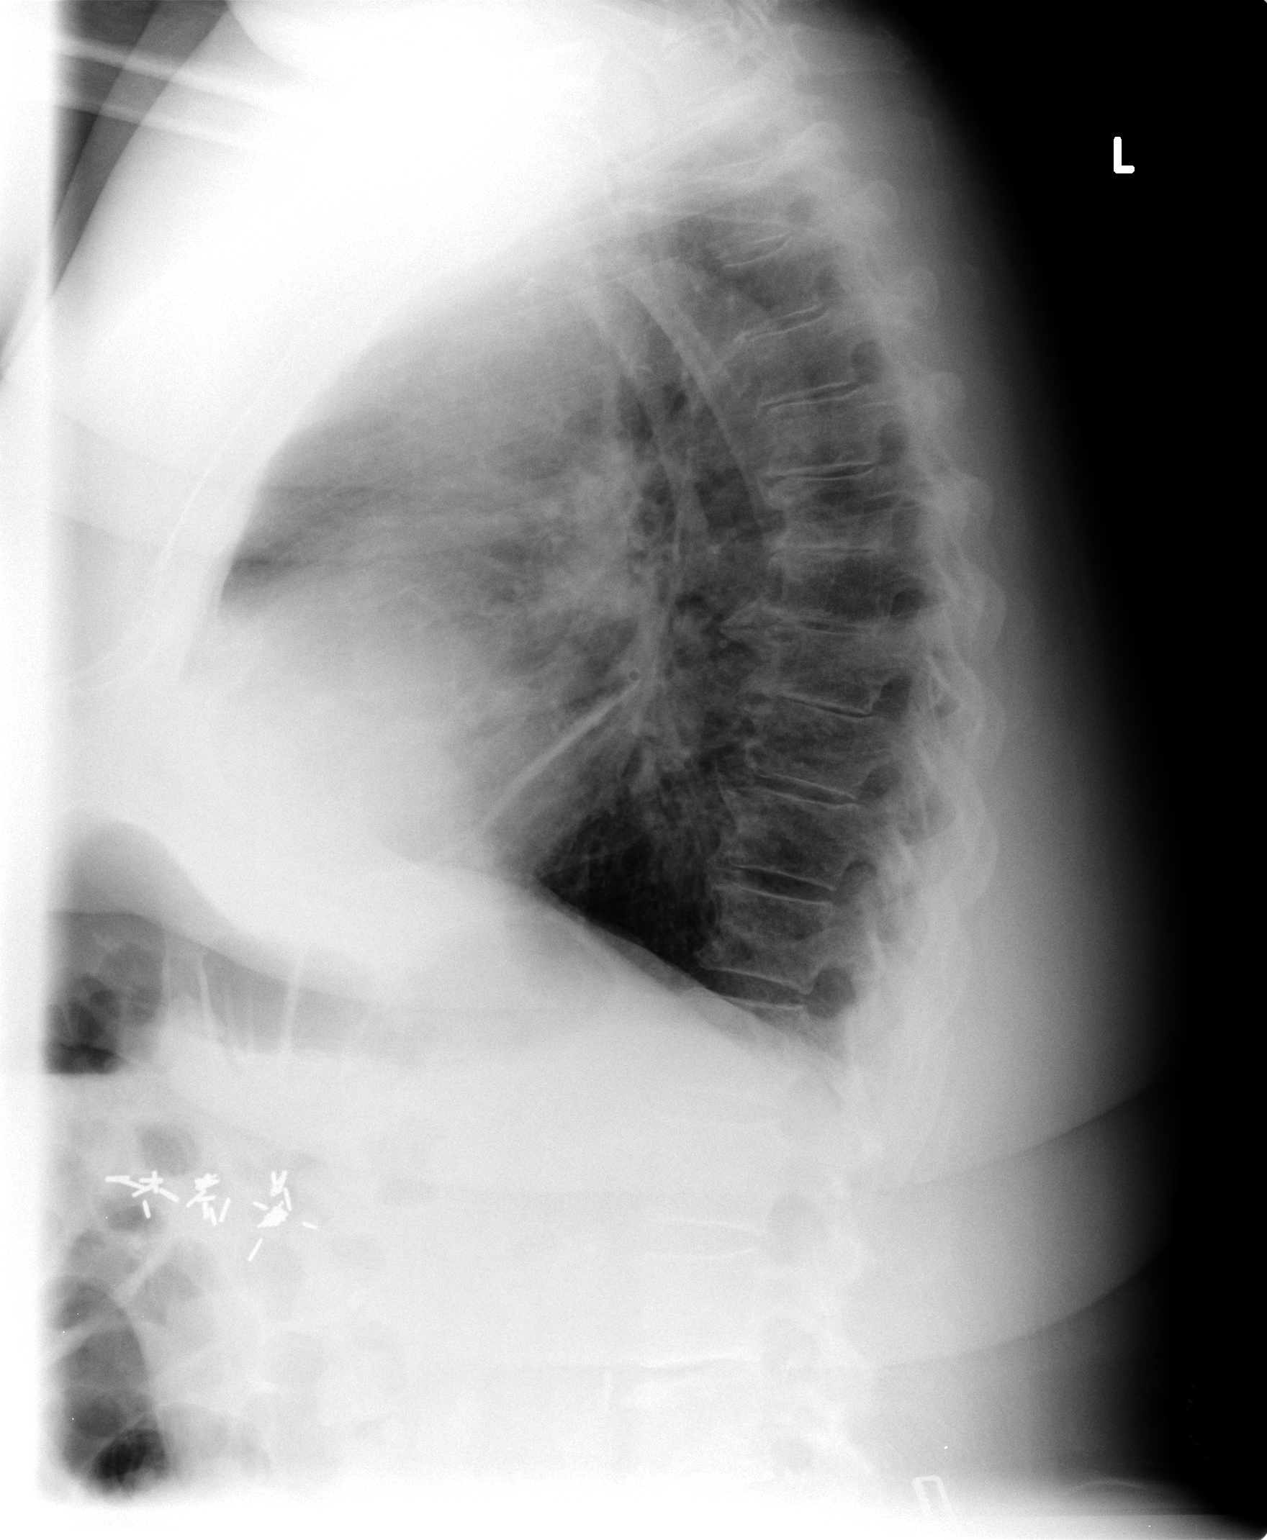

[2 of 2 positions shown; findings below may reference images not displayed]

FINDINGS: There is mild cardiac enlargement. Atelectasis is identified within
the left lung base. No airspace consolidation. The visualized
skeletal structures are unremarkable.
IMPRESSION: 1. Cardiac enlargement and left base atelectasis.

## 2015-05-18 DIAGNOSIS — M179 Osteoarthritis of knee, unspecified: Secondary | ICD-10-CM | POA: Diagnosis not present

## 2015-05-27 ENCOUNTER — Other Ambulatory Visit: Payer: Self-pay | Admitting: Family Medicine

## 2015-05-28 ENCOUNTER — Other Ambulatory Visit: Payer: Self-pay | Admitting: Family Medicine

## 2015-05-29 ENCOUNTER — Telehealth: Payer: Self-pay

## 2015-05-29 NOTE — Telephone Encounter (Signed)
States the temezepam isnt working at all for her and she states she used to take trazodone 100mg  three tabs at bedtime and it worked Engineer, manufacturing. Wants to know if you will send her in an rx?

## 2015-05-29 NOTE — Telephone Encounter (Signed)
Start with trazodone 100 mg  At bedtime, call in 1 week if ineffective for titration up to 200 mg taking two 100 mg caps Keep appt 06/22  REVIEW sleep hygiene with patitnet , and offer counseling/ therapy for depression and anxiety pls I have entered ned med pls fax etc

## 2015-05-30 ENCOUNTER — Other Ambulatory Visit: Payer: Self-pay

## 2015-05-30 MED ORDER — TRAZODONE HCL 100 MG PO TABS: 100.0000 mg | ORAL_TABLET | Freq: Every day | ORAL | Status: DC

## 2015-05-31 NOTE — Telephone Encounter (Signed)
Pt aware.

## 2015-06-08 DIAGNOSIS — R7309 Other abnormal glucose: Secondary | ICD-10-CM | POA: Diagnosis not present

## 2015-06-08 DIAGNOSIS — E785 Hyperlipidemia, unspecified: Secondary | ICD-10-CM | POA: Diagnosis not present

## 2015-06-09 LAB — LIPID PANEL
Cholesterol: 150 mg/dL (ref 0–200)
HDL: 49 mg/dL (ref >=46)
LDL Cholesterol: 54 mg/dL (ref 0–99)
Total CHOL/HDL Ratio: 3.1 Ratio
Triglycerides: 236 mg/dL — ABNORMAL HIGH (ref <150)
VLDL: 47 mg/dL — AB (ref 0–40)

## 2015-06-09 LAB — COMPLETE METABOLIC PANEL WITH GFR
ALBUMIN: 4 g/dL (ref 3.5–5.2)
ALK PHOS: 48 U/L (ref 39–117)
ALT: 13 U/L (ref 0–35)
AST: 18 U/L (ref 0–37)
BUN: 9 mg/dL (ref 6–23)
CALCIUM: 9.3 mg/dL (ref 8.4–10.5)
CHLORIDE: 104 meq/L (ref 96–112)
CO2: 25 meq/L (ref 19–32)
Creat: 0.61 mg/dL (ref 0.50–1.10)
GLUCOSE: 114 mg/dL — AB (ref 70–99)
POTASSIUM: 3.9 meq/L (ref 3.5–5.3)
SODIUM: 142 meq/L (ref 135–145)
Total Bilirubin: 0.4 mg/dL (ref 0.2–1.2)
Total Protein: 6.4 g/dL (ref 6.0–8.3)

## 2015-06-09 LAB — HEMOGLOBIN A1C
HEMOGLOBIN A1C: 6.5 % — AB (ref <5.7)
Mean Plasma Glucose: 140 mg/dL — ABNORMAL HIGH (ref <117)

## 2015-06-11 ENCOUNTER — Other Ambulatory Visit: Payer: Self-pay | Admitting: Family Medicine

## 2015-06-13 ENCOUNTER — Ambulatory Visit (INDEPENDENT_AMBULATORY_CARE_PROVIDER_SITE_OTHER): Payer: MEDICARE | Admitting: Family Medicine

## 2015-06-13 ENCOUNTER — Encounter: Payer: Self-pay | Admitting: Family Medicine

## 2015-06-13 VITALS — BP 116/78 | HR 78 | Resp 18 | Ht <= 58 in | Wt 206.1 lb

## 2015-06-13 DIAGNOSIS — G4700 Insomnia, unspecified: Secondary | ICD-10-CM

## 2015-06-13 DIAGNOSIS — E559 Vitamin D deficiency, unspecified: Secondary | ICD-10-CM

## 2015-06-13 DIAGNOSIS — J449 Chronic obstructive pulmonary disease, unspecified: Secondary | ICD-10-CM

## 2015-06-13 DIAGNOSIS — F32A Depression, unspecified: Secondary | ICD-10-CM

## 2015-06-13 DIAGNOSIS — E1140 Type 2 diabetes mellitus with diabetic neuropathy, unspecified: Secondary | ICD-10-CM

## 2015-06-13 DIAGNOSIS — K1379 Other lesions of oral mucosa: Secondary | ICD-10-CM

## 2015-06-13 DIAGNOSIS — F329 Major depressive disorder, single episode, unspecified: Secondary | ICD-10-CM

## 2015-06-13 DIAGNOSIS — I10 Essential (primary) hypertension: Secondary | ICD-10-CM

## 2015-06-13 DIAGNOSIS — F17210 Nicotine dependence, cigarettes, uncomplicated: Secondary | ICD-10-CM

## 2015-06-13 DIAGNOSIS — F17208 Nicotine dependence, unspecified, with other nicotine-induced disorders: Secondary | ICD-10-CM

## 2015-06-13 DIAGNOSIS — E785 Hyperlipidemia, unspecified: Secondary | ICD-10-CM

## 2015-06-13 MED ORDER — BENZONATATE 100 MG PO CAPS: 100.0000 mg | ORAL_CAPSULE | Freq: Two times a day (BID) | ORAL | Status: DC

## 2015-06-13 MED ORDER — LIDOCAINE VISCOUS 2 % MT SOLN: OROMUCOSAL | Status: DC

## 2015-06-13 MED ORDER — TRAZODONE HCL 100 MG PO TABS: ORAL_TABLET | ORAL | Status: DC

## 2015-06-13 MED ORDER — PREDNISONE 5 MG PO TABS: 5.0000 mg | ORAL_TABLET | Freq: Two times a day (BID) | ORAL | Status: DC

## 2015-06-13 NOTE — Assessment & Plan Note (Signed)
Controlled, no change in medication DASH diet and commitment to daily physical activity for a minimum of 30 minutes discussed and encouraged, as a part of hypertension management. The importance of attaining a healthy weight is also discussed.  BP/Weight 06/13/2015 03/13/2015 12/04/2014 11/01/2014 10/04/2014 07/29/2014 3/49/1791  Systolic BP 505 697 948 016 553 748 270  Diastolic BP 78 88 94 92 74 97 120  Wt. (Lbs) 206.12 216.4 223.12 225 220.08 233 229  BMI 44.59 46.82 48.27 48.68 47.61 47.04 46.23

## 2015-06-13 NOTE — Patient Instructions (Signed)
F/u in 4 months, call if yopu need me before  You are now diabetic, so please watch carbohydrate intake and continue to work on weigfht loss, Examine feet every day  Quit date for nicotine is June 30, and dose of trazodone is 200mg  at night  Prednisone and decongestant sent for cough  Lidocaine sent to help with oral pain  Please work on good  health habits so that your health will improve. 1. Commitment to daily physical activity for 30 to 60  minutes, if you are able to do this.  2. Commitment to wise food choices. Aim for half of your  food intake to be vegetable and fruit, one quarter starchy foods, and one quarter protein. Try to eat on a regular schedule  3 meals per day, snacking between meals should be limited to vegetables or fruits or small portions of nuts. 64 ounces of water per day is generally recommended, unless you have specific health conditions, like heart failure or kidney failure where you will need to limit fluid intake.  3. Commitment to sufficient and a  good quality of physical and mental rest daily, generally between 6 to 8 hours per day.  WITH PERSISTANCE AND PERSEVERANCE, THE IMPOSSIBLE , BECOMES THE NORM! Thanks for choosing [facility], we consider it a privelige to serve you. Labs non fast for next visit

## 2015-06-13 NOTE — Assessment & Plan Note (Signed)

## 2015-06-13 NOTE — Assessment & Plan Note (Signed)
Sleep hygiene reviewed and written information offered also. Prescription sent for  medication needed. Increased dose of trazodone  To 200 mg  works

## 2015-06-14 LAB — MICROALBUMIN / CREATININE URINE RATIO
Creatinine, Urine: 227.6 mg/dL
MICROALB UR: 2.1 mg/dL — AB (ref <2.0)
Microalb Creat Ratio: 9.2 mg/g (ref 0.0–30.0)

## 2015-06-17 ENCOUNTER — Encounter: Payer: Self-pay | Admitting: Family Medicine

## 2015-06-17 NOTE — Assessment & Plan Note (Signed)
Hyperlipidemia:Low fat diet discussed and encouraged.   Lipid Panel  Lab Results  Component Value Date   CHOL 150 06/08/2015   HDL 49 06/08/2015   LDLCALC 54 06/08/2015   TRIG 236* 06/08/2015   CHOLHDL 3.1 06/08/2015   TG elevated, dietary change discussed, no med change

## 2015-06-17 NOTE — Assessment & Plan Note (Signed)
Adrienne Nelson is reminded of the importance of commitment to daily physical activity for 30 minutes or more, as able and the need to limit carbohydrate intake to 30 to 60 grams per meal to help with blood sugar control.   The need to take medication as prescribed, test blood sugar as directed, and to call between visits if there is a concern that blood sugar is uncontrolled is also discussed.   Adrienne Nelson is reminded of the importance of daily foot exam, annual eye examination, and good blood sugar, blood pressure and cholesterol control.  Diabetic Labs Latest Ref Rng 06/13/2015 06/08/2015 03/09/2015 10/02/2014 07/29/2014  HbA1c <5.7 % - 6.5(H) 6.3(H) 6.1(H) -  Microalbumin <2.0 mg/dL 2.1(H) - - - -  Micro/Creat Ratio 0.0 - 30.0 mg/g 9.2 - - - -  Chol 0 - 200 mg/dL - 150 167 180 -  HDL >=46 mg/dL - 49 57 65 -  Calc LDL 0 - 99 mg/dL - 54 66 82 -  Triglycerides <150 mg/dL - 236(H) 218(H) 167(H) -  Creatinine 0.50 - 1.10 mg/dL - 0.61 0.66 0.69 0.73   BP/Weight 06/13/2015 03/13/2015 12/04/2014 11/01/2014 10/04/2014 07/29/2014 9/67/5916  Systolic BP 384 665 993 570 177 939 030  Diastolic BP 78 88 94 92 74 97 120  Wt. (Lbs) 206.12 216.4 223.12 225 220.08 233 229  BMI 44.59 46.82 48.27 48.68 47.61 47.04 46.23   Foot/eye exam completion dates 06/13/2015 08/20/2010  Foot exam Order - yes  Foot Form Completion Done -

## 2015-06-17 NOTE — Progress Notes (Signed)
Subjective:    Patient ID: Adrienne Nelson, female    DOB: Jun 28, 1951, 64 y.o.   MRN: [id number]  HPI The PT is here for follow up and re-evaluation of chronic medical conditions, medication management and review of any available recent lab and radiology data.  Preventive health is updated, specifically  Cancer screening and Immunization.   Questions or concerns regarding consultations or procedures which the PT has had in the interim are  addressed. The PT denies any adverse reactions to current medications since the last visit.  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 Denies sinus pressure, nasal congestion, ear pain or sore throat.  Denies chest pains, palpitations and leg swelling Denies abdominal pain, nausea, vomiting, does have chronic diarrheah Denies dysuria, frequency, hesitancy , does have chronic  incontinence.  Denies headaches, seizures, chronic  numbness, and  tingling. Denies uncontrolled depression, anxiety states needs higfher dose of med to treat her insomnia i. Denies skin break down or rash.        Objective:   Physical Exam BP 116/78 mmHg  Pulse 78  Resp 18  Ht 4\' 9"  (1.448 m)  Wt 206 lb 1.9 oz (93.495 kg)  BMI 44.59 kg/m2  SpO2 92% Patient alert and oriented and in no cardiopulmonary distress.  HEENT: No facial asymmetry, EOMI,   oropharynx pink and moist.  Neck supple no JVD, no mass.  Chest: Clear to auscultation bilaterally.dewcreased though adequate air entry CVS: S1, S2 no murmurs, no S3.Regular rate.  ABD: Soft non tender.   Ext: No edema  MS: Adequate though reduced  ROM spine, shoulders,  hips and knees.  Skin: Intact, no ulcerations or rash noted.  Psych: Good eye contact, normal affect. Memory intact not anxious or depressed appearing.  CNS: CN 2-12 intact, power,  normal throughout.no focal deficits noted.       Assessment & Plan:  Essential hypertension Controlled, no change in medication DASH diet and commitment to daily physical activity for a minimum of 30 minutes discussed and encouraged, as a part of hypertension management. The importance of attaining a healthy weight is also discussed.  BP/Weight 06/13/2015 03/13/2015 12/04/2014 11/01/2014 10/04/2014 07/29/2014 5/46/5681  Systolic BP 275 170 017 494 496 759 163  Diastolic BP 78 88 94 92 74 97 120  Wt. (Lbs) 206.12 216.4 223.12 225 220.08 233 229  BMI 44.59 46.82 48.27 48.68 47.61 47.04 46.23        Insomnia Sleep hygiene reviewed and written information offered also. Prescription sent for  medication needed. Increased dose of trazodone  To 200 mg  works  Nicotine dependence Patient counseled for approximately 5 minutes regarding the health risks of ongoing nicotine use, specifically all types of cancer, heart disease, stroke and respiratory failure. The options available for help with cessation ,the behavioral changes to assist the process, and the option to either gradully reduce usage  Or abruptly stop.is also discussed. Pt is also encouraged to set specific goals in number of cigarettes used daily, as well as to set a quit date.  Number of cigarettes/cigars currently smoking daily: 3  Depression Controlled, no change in medication   Morbid obesity Improved Patient re-educated about  the importance of commitment to a  minimum of 150 minutes of exercise per week.  The importance of healthy food choices with portion control discussed. Encouraged to start a food diary, count calories and to consider  joining a support group. Sample diet sheets offered. Goals set by the patient for the next  several months.   Weight /BMI 06/13/2015 03/13/2015 12/04/2014  WEIGHT 206 lb 1.9 oz 216 lb 6.4 oz 223 lb 1.9 oz  HEIGHT 4\' 9"  4\' 9"  4\' 9"   BMI 44.59 kg/m2 46.82 kg/m2 48.27 kg/m2    Current exercise per week **60 minutes.   Hyperlipidemia LDL goal <100 Hyperlipidemia:Low fat diet discussed and encouraged.   Lipid Panel  Lab Results  Component Value Date   CHOL 150 06/08/2015   HDL 49 06/08/2015   LDLCALC 54 06/08/2015   TRIG 236* 06/08/2015   CHOLHDL 3.1 06/08/2015   TG elevated, dietary change discussed, no med change     COPD (chronic obstructive pulmonary disease) with chronic bronchitis Increased cough and dyspnea inpast week, short course of steroid , encouraged to stop smoking also  Diabetes mellitus with neuropathy Adrienne Nelson is reminded of the importance of commitment to daily physical activity for 30 minutes or more, as able and the need to limit carbohydrate intake to 30 to 60 grams per meal to help with blood sugar control.   The need to take medication as prescribed, test blood sugar as directed, and to call between visits if there is a concern that blood sugar is uncontrolled is also discussed.   Adrienne Nelson is reminded of the importance of daily foot exam, annual eye examination, and good blood sugar, blood pressure and cholesterol control.  Diabetic Labs Latest Ref Rng 06/13/2015 06/08/2015 03/09/2015 10/02/2014 07/29/2014  HbA1c <5.7 % - 6.5(H) 6.3(H) 6.1(H) -  Microalbumin <2.0 mg/dL 2.1(H) - - - -  Micro/Creat Ratio 0.0 - 30.0 mg/g 9.2 - - - -  Chol 0 - 200 mg/dL - 150 167 180 -  HDL >=46 mg/dL - 49 57 65 -  Calc LDL 0 - 99 mg/dL - 54 66 82 -  Triglycerides <150 mg/dL - 236(H) 218(H) 167(H) -  Creatinine 0.50 - 1.10 mg/dL - 0.61 0.66 0.69 0.73   BP/Weight 06/13/2015 03/13/2015 12/04/2014 11/01/2014 10/04/2014 07/29/2014 8/78/6767  Systolic BP 209 470 962 836 629 476 546  Diastolic BP 78 88 94 92 74 97 120  Wt. (Lbs) 206.12 216.4 223.12 225 220.08 233 229    BMI 44.59 46.82 48.27 48.68 47.61 47.04 46.23   Foot/eye exam completion dates 06/13/2015 08/20/2010  Foot exam Order - yes  Foot Form Completion Done -

## 2015-06-17 NOTE — Assessment & Plan Note (Signed)
Controlled, no change in medication  

## 2015-06-17 NOTE — Assessment & Plan Note (Signed)
Increased cough and dyspnea inpast week, short course of steroid , encouraged to stop smoking also

## 2015-06-17 NOTE — Assessment & Plan Note (Addendum)
Improved Patient re-educated about  the importance of commitment to a  minimum of 150 minutes of exercise per week.  The importance of healthy food choices with portion control discussed. Encouraged to start a food diary, count calories and to consider  joining a support group. Sample diet sheets offered. Goals set by the patient for the next several months.   Weight /BMI 06/13/2015 03/13/2015 12/04/2014  WEIGHT 206 lb 1.9 oz 216 lb 6.4 oz 223 lb 1.9 oz  HEIGHT 4\' 9"  4\' 9"  4\' 9"   BMI 44.59 kg/m2 46.82 kg/m2 48.27 kg/m2    Current exercise per week **60 minutes.

## 2015-06-18 DIAGNOSIS — Z886 Allergy status to analgesic agent status: Secondary | ICD-10-CM | POA: Diagnosis not present

## 2015-06-18 DIAGNOSIS — J449 Chronic obstructive pulmonary disease, unspecified: Secondary | ICD-10-CM | POA: Diagnosis not present

## 2015-06-18 DIAGNOSIS — M47812 Spondylosis without myelopathy or radiculopathy, cervical region: Secondary | ICD-10-CM | POA: Diagnosis not present

## 2015-06-18 DIAGNOSIS — G588 Other specified mononeuropathies: Secondary | ICD-10-CM | POA: Diagnosis not present

## 2015-06-18 DIAGNOSIS — Z7982 Long term (current) use of aspirin: Secondary | ICD-10-CM | POA: Diagnosis not present

## 2015-06-18 DIAGNOSIS — M791 Myalgia: Secondary | ICD-10-CM | POA: Diagnosis not present

## 2015-06-18 DIAGNOSIS — F329 Major depressive disorder, single episode, unspecified: Secondary | ICD-10-CM | POA: Diagnosis not present

## 2015-06-18 DIAGNOSIS — M179 Osteoarthritis of knee, unspecified: Secondary | ICD-10-CM | POA: Diagnosis not present

## 2015-06-18 DIAGNOSIS — E785 Hyperlipidemia, unspecified: Secondary | ICD-10-CM | POA: Diagnosis not present

## 2015-06-18 DIAGNOSIS — G4700 Insomnia, unspecified: Secondary | ICD-10-CM | POA: Diagnosis not present

## 2015-06-18 DIAGNOSIS — M47816 Spondylosis without myelopathy or radiculopathy, lumbar region: Secondary | ICD-10-CM | POA: Diagnosis not present

## 2015-06-18 DIAGNOSIS — Z87891 Personal history of nicotine dependence: Secondary | ICD-10-CM | POA: Diagnosis not present

## 2015-06-18 DIAGNOSIS — F419 Anxiety disorder, unspecified: Secondary | ICD-10-CM | POA: Diagnosis not present

## 2015-06-18 DIAGNOSIS — Z880 Allergy status to penicillin: Secondary | ICD-10-CM | POA: Diagnosis not present

## 2015-06-18 DIAGNOSIS — G8929 Other chronic pain: Secondary | ICD-10-CM | POA: Diagnosis not present

## 2015-06-18 DIAGNOSIS — Z79899 Other long term (current) drug therapy: Secondary | ICD-10-CM | POA: Diagnosis not present

## 2015-06-18 DIAGNOSIS — I10 Essential (primary) hypertension: Secondary | ICD-10-CM | POA: Diagnosis not present

## 2015-06-18 DIAGNOSIS — Z7951 Long term (current) use of inhaled steroids: Secondary | ICD-10-CM | POA: Diagnosis not present

## 2015-07-02 DIAGNOSIS — M25562 Pain in left knee: Secondary | ICD-10-CM | POA: Diagnosis not present

## 2015-07-02 DIAGNOSIS — M1712 Unilateral primary osteoarthritis, left knee: Secondary | ICD-10-CM | POA: Diagnosis not present

## 2015-07-09 DIAGNOSIS — M71562 Other bursitis, not elsewhere classified, left knee: Secondary | ICD-10-CM | POA: Diagnosis not present

## 2015-07-09 DIAGNOSIS — M23312 Other meniscus derangements, anterior horn of medial meniscus, left knee: Secondary | ICD-10-CM | POA: Diagnosis not present

## 2015-07-09 DIAGNOSIS — M25562 Pain in left knee: Secondary | ICD-10-CM | POA: Diagnosis not present

## 2015-07-09 DIAGNOSIS — M1712 Unilateral primary osteoarthritis, left knee: Secondary | ICD-10-CM | POA: Diagnosis not present

## 2015-07-09 DIAGNOSIS — S83242A Other tear of medial meniscus, current injury, left knee, initial encounter: Secondary | ICD-10-CM | POA: Diagnosis not present

## 2015-07-11 DIAGNOSIS — S83242D Other tear of medial meniscus, current injury, left knee, subsequent encounter: Secondary | ICD-10-CM | POA: Diagnosis not present

## 2015-07-24 DIAGNOSIS — K5190 Ulcerative colitis, unspecified, without complications: Secondary | ICD-10-CM | POA: Diagnosis not present

## 2015-07-24 DIAGNOSIS — Z7982 Long term (current) use of aspirin: Secondary | ICD-10-CM | POA: Diagnosis not present

## 2015-07-24 DIAGNOSIS — Z8249 Family history of ischemic heart disease and other diseases of the circulatory system: Secondary | ICD-10-CM | POA: Diagnosis not present

## 2015-07-24 DIAGNOSIS — Z886 Allergy status to analgesic agent status: Secondary | ICD-10-CM | POA: Diagnosis not present

## 2015-07-24 DIAGNOSIS — E119 Type 2 diabetes mellitus without complications: Secondary | ICD-10-CM | POA: Diagnosis not present

## 2015-07-24 DIAGNOSIS — Z880 Allergy status to penicillin: Secondary | ICD-10-CM | POA: Diagnosis not present

## 2015-07-24 DIAGNOSIS — E785 Hyperlipidemia, unspecified: Secondary | ICD-10-CM | POA: Diagnosis not present

## 2015-07-24 DIAGNOSIS — M179 Osteoarthritis of knee, unspecified: Secondary | ICD-10-CM | POA: Diagnosis not present

## 2015-07-24 DIAGNOSIS — K219 Gastro-esophageal reflux disease without esophagitis: Secondary | ICD-10-CM | POA: Diagnosis not present

## 2015-07-24 DIAGNOSIS — K580 Irritable bowel syndrome with diarrhea: Secondary | ICD-10-CM | POA: Diagnosis not present

## 2015-07-24 DIAGNOSIS — X58XXXA Exposure to other specified factors, initial encounter: Secondary | ICD-10-CM | POA: Diagnosis not present

## 2015-07-24 DIAGNOSIS — Z859 Personal history of malignant neoplasm, unspecified: Secondary | ICD-10-CM | POA: Diagnosis not present

## 2015-07-24 DIAGNOSIS — I10 Essential (primary) hypertension: Secondary | ICD-10-CM | POA: Diagnosis not present

## 2015-07-24 DIAGNOSIS — F17200 Nicotine dependence, unspecified, uncomplicated: Secondary | ICD-10-CM | POA: Diagnosis not present

## 2015-07-24 DIAGNOSIS — Z6841 Body Mass Index (BMI) 40.0 and over, adult: Secondary | ICD-10-CM | POA: Diagnosis not present

## 2015-07-24 DIAGNOSIS — F329 Major depressive disorder, single episode, unspecified: Secondary | ICD-10-CM | POA: Diagnosis not present

## 2015-07-24 DIAGNOSIS — S83242A Other tear of medial meniscus, current injury, left knee, initial encounter: Secondary | ICD-10-CM | POA: Diagnosis not present

## 2015-07-24 DIAGNOSIS — M1990 Unspecified osteoarthritis, unspecified site: Secondary | ICD-10-CM | POA: Diagnosis not present

## 2015-07-24 DIAGNOSIS — Z79899 Other long term (current) drug therapy: Secondary | ICD-10-CM | POA: Diagnosis not present

## 2015-08-08 ENCOUNTER — Other Ambulatory Visit: Payer: Self-pay | Admitting: Family Medicine

## 2015-08-08 NOTE — Telephone Encounter (Signed)
Patient is calling requesting medication for pill not patches for hot flashes and night sweats called to [facility] in [place], please advise?

## 2015-08-09 ENCOUNTER — Telehealth: Payer: Self-pay

## 2015-08-09 NOTE — Telephone Encounter (Signed)
Suffers from very bad hot flashes during the day and at night and she wants something to use for it. Still taking the fluoxetine. Please advise

## 2015-08-10 ENCOUNTER — Other Ambulatory Visit: Payer: Self-pay

## 2015-08-10 MED ORDER — VENLAFAXINE HCL ER 37.5 MG PO CP24: 37.5000 mg | ORAL_CAPSULE | Freq: Every day | ORAL | Status: DC

## 2015-08-10 NOTE — Telephone Encounter (Signed)
Pt aware.

## 2015-08-10 NOTE — Telephone Encounter (Signed)
Will add effexor, entered pls send after you spk with her

## 2015-08-21 ENCOUNTER — Telehealth: Payer: Self-pay | Admitting: *Deleted

## 2015-08-21 ENCOUNTER — Other Ambulatory Visit: Payer: Self-pay

## 2015-08-21 MED ORDER — VENLAFAXINE HCL ER 75 MG PO CP24: 75.0000 mg | ORAL_CAPSULE | Freq: Every day | ORAL | Status: DC

## 2015-08-21 NOTE — Telephone Encounter (Signed)
I recommend a dose increase to 75 mg daily, try this for the next 2 months, pls send in higher dose, please, will discuss further if needed at the October visit

## 2015-08-21 NOTE — Telephone Encounter (Signed)
The effexor isn't helping her hot flashes and wants to know if there is anything else she can take. Please advise

## 2015-08-21 NOTE — Telephone Encounter (Signed)
Patient aware and will send in higher dose

## 2015-08-21 NOTE — Telephone Encounter (Signed)
Pt called stating the medication for the got flashes isn't helping, pt is requesting something [phone]

## 2015-08-25 ENCOUNTER — Other Ambulatory Visit: Payer: Self-pay | Admitting: Family Medicine

## 2015-09-22 ENCOUNTER — Other Ambulatory Visit: Payer: Self-pay | Admitting: Family Medicine

## 2015-09-25 ENCOUNTER — Encounter: Payer: Self-pay | Admitting: Family Medicine

## 2015-09-27 ENCOUNTER — Other Ambulatory Visit: Payer: Self-pay

## 2015-09-27 DIAGNOSIS — K1379 Other lesions of oral mucosa: Secondary | ICD-10-CM

## 2015-09-27 MED ORDER — LIDOCAINE VISCOUS 2 % MT SOLN: OROMUCOSAL | Status: DC

## 2015-09-27 NOTE — Telephone Encounter (Signed)
Spoke with patient and she states that she is having more oral pain primarily on the left side. States that she was told that this will get no better by ENT.  She will keep upcoming appointment in October to address further refills.

## 2015-10-10 ENCOUNTER — Other Ambulatory Visit: Payer: Self-pay | Admitting: Family Medicine

## 2015-10-10 DIAGNOSIS — E559 Vitamin D deficiency, unspecified: Secondary | ICD-10-CM | POA: Diagnosis not present

## 2015-10-10 DIAGNOSIS — E1140 Type 2 diabetes mellitus with diabetic neuropathy, unspecified: Secondary | ICD-10-CM | POA: Diagnosis not present

## 2015-10-10 DIAGNOSIS — Z1159 Encounter for screening for other viral diseases: Secondary | ICD-10-CM | POA: Diagnosis not present

## 2015-10-10 DIAGNOSIS — I10 Essential (primary) hypertension: Secondary | ICD-10-CM | POA: Diagnosis not present

## 2015-10-10 DIAGNOSIS — G4700 Insomnia, unspecified: Secondary | ICD-10-CM | POA: Diagnosis not present

## 2015-10-11 ENCOUNTER — Other Ambulatory Visit: Payer: Self-pay | Admitting: Family Medicine

## 2015-10-11 LAB — HEMOGLOBIN A1C
HEMOGLOBIN A1C: 6.1 % — AB (ref <5.7)
Mean Plasma Glucose: 128 mg/dL — ABNORMAL HIGH (ref <117)

## 2015-10-11 LAB — CBC
HEMATOCRIT: 43.6 % (ref 36.0–46.0)
Hemoglobin: 15.1 g/dL — ABNORMAL HIGH (ref 12.0–15.0)
MCH: 30 pg (ref 26.0–34.0)
MCHC: 34.6 g/dL (ref 30.0–36.0)
MCV: 86.7 fL (ref 78.0–100.0)
MPV: 10.1 fL (ref 8.6–12.4)
Platelets: 308 10*3/uL (ref 150–400)
RBC: 5.03 MIL/uL (ref 3.87–5.11)
RDW: 14.7 % (ref 11.5–15.5)
WBC: 7.4 10*3/uL (ref 4.0–10.5)

## 2015-10-11 LAB — COMPLETE METABOLIC PANEL WITH GFR
ALT: 12 U/L (ref 6–29)
AST: 17 U/L (ref 10–35)
Albumin: 3.8 g/dL (ref 3.6–5.1)
Alkaline Phosphatase: 40 U/L (ref 33–130)
BUN: 17 mg/dL (ref 7–25)
CALCIUM: 10.1 mg/dL (ref 8.6–10.4)
CHLORIDE: 97 mmol/L — AB (ref 98–110)
CO2: 30 mmol/L (ref 20–31)
CREATININE: 0.7 mg/dL (ref 0.50–0.99)
GFR, Est African American: >89 mL/min (ref >=60)
GFR, Est Non African American: >89 mL/min (ref >=60)
Glucose, Bld: 123 mg/dL — ABNORMAL HIGH (ref 65–99)
Potassium: 3.2 mmol/L — ABNORMAL LOW (ref 3.5–5.3)
Sodium: 141 mmol/L (ref 135–146)
Total Bilirubin: 0.4 mg/dL (ref 0.2–1.2)
Total Protein: 6.2 g/dL (ref 6.1–8.1)

## 2015-10-11 LAB — VITAMIN D 25 HYDROXY (VIT D DEFICIENCY, FRACTURES): Vit D, 25-Hydroxy: 61 ng/mL (ref 30–100)

## 2015-10-11 LAB — HIV ANTIBODY (ROUTINE TESTING W REFLEX): HIV 1&2 Ab, 4th Generation: NONREACTIVE

## 2015-10-15 ENCOUNTER — Other Ambulatory Visit: Payer: Self-pay

## 2015-10-15 MED ORDER — POTASSIUM CHLORIDE CRYS ER 20 MEQ PO TBCR: 20.0000 meq | EXTENDED_RELEASE_TABLET | Freq: Every day | ORAL | Status: DC

## 2015-10-16 ENCOUNTER — Ambulatory Visit (INDEPENDENT_AMBULATORY_CARE_PROVIDER_SITE_OTHER): Payer: MEDICARE | Admitting: Family Medicine

## 2015-10-16 ENCOUNTER — Telehealth: Payer: Self-pay | Admitting: *Deleted

## 2015-10-16 ENCOUNTER — Encounter: Payer: Self-pay | Admitting: Family Medicine

## 2015-10-16 VITALS — BP 118/74 | HR 78 | Resp 16 | Ht <= 58 in | Wt 193.1 lb

## 2015-10-16 DIAGNOSIS — M545 Low back pain, unspecified: Secondary | ICD-10-CM

## 2015-10-16 DIAGNOSIS — I10 Essential (primary) hypertension: Secondary | ICD-10-CM

## 2015-10-16 DIAGNOSIS — F32A Depression, unspecified: Secondary | ICD-10-CM

## 2015-10-16 DIAGNOSIS — E1140 Type 2 diabetes mellitus with diabetic neuropathy, unspecified: Secondary | ICD-10-CM

## 2015-10-16 DIAGNOSIS — R300 Dysuria: Secondary | ICD-10-CM

## 2015-10-16 DIAGNOSIS — E785 Hyperlipidemia, unspecified: Secondary | ICD-10-CM | POA: Diagnosis not present

## 2015-10-16 DIAGNOSIS — F17218 Nicotine dependence, cigarettes, with other nicotine-induced disorders: Secondary | ICD-10-CM

## 2015-10-16 DIAGNOSIS — K1379 Other lesions of oral mucosa: Secondary | ICD-10-CM

## 2015-10-16 DIAGNOSIS — R42 Dizziness and giddiness: Secondary | ICD-10-CM | POA: Diagnosis not present

## 2015-10-16 DIAGNOSIS — Z23 Encounter for immunization: Secondary | ICD-10-CM

## 2015-10-16 DIAGNOSIS — M79605 Pain in left leg: Secondary | ICD-10-CM | POA: Diagnosis not present

## 2015-10-16 DIAGNOSIS — N3001 Acute cystitis with hematuria: Secondary | ICD-10-CM | POA: Diagnosis not present

## 2015-10-16 DIAGNOSIS — N3000 Acute cystitis without hematuria: Secondary | ICD-10-CM | POA: Diagnosis not present

## 2015-10-16 DIAGNOSIS — F329 Major depressive disorder, single episode, unspecified: Secondary | ICD-10-CM

## 2015-10-16 LAB — POCT URINALYSIS DIPSTICK
GLUCOSE UA: NEGATIVE
Ketones, UA: NEGATIVE
LEUKOCYTES UA: NEGATIVE
Nitrite, UA: POSITIVE
PROTEIN UA: 100
SPEC GRAV UA: 1.025
UROBILINOGEN UA: 0.2
pH, UA: 5.5

## 2015-10-16 MED ORDER — PREGABALIN 25 MG PO CAPS: 25.0000 mg | ORAL_CAPSULE | Freq: Three times a day (TID) | ORAL | Status: DC

## 2015-10-16 MED ORDER — LIDOCAINE VISCOUS 2 % MT SOLN: OROMUCOSAL | Status: DC

## 2015-10-16 MED ORDER — METHYLPREDNISOLONE ACETATE 80 MG/ML IJ SUSP
80.0000 mg | Freq: Once | INTRAMUSCULAR | Status: AC
  Administered 2015-10-16: 80 mg via INTRAMUSCULAR

## 2015-10-16 MED ORDER — PREDNISONE 5 MG PO TABS: 5.0000 mg | ORAL_TABLET | Freq: Two times a day (BID) | ORAL | Status: DC

## 2015-10-16 NOTE — Progress Notes (Signed)
Subjective:    Patient ID: Adrienne Nelson, female    DOB: Oct 03, 1951, 64 y.o.   MRN: [id number]  HPI  Left facial pain uncontrolled at a 10, unable to eat, affects both upper and lower jaw and left side  Of tongue, unable to eat as a result, wants higher lidocaine dose and eNT re eval, also recurrent dizziness C/o intolerable left thigh pain rated a t a 10, interferes her entire life negatively. 1 month h/o intermittent dysuria, denies fever , chills or flank pain Immunization is updated and labs are reviewed. Still smoking , unwilling to set a quit date Review of Systems See HPI Denies recent fever or chills. Denies sinus pressure, nasal congestion, ear pain or sore throat. Denies chest congestion, productive cough or wheezing. Denies chest pains, palpitations and leg swelling    Denies dysuria, frequency, hesitancy or incontinence.  Denies headaches, seizures,  Denies uncontrolled depression, anxiety or insomnia. Denies skin break down or rash.        Objective:   Physical Exam BP 118/74 mmHg  Pulse 78  Resp 16  Ht 4\' 9"  (1.448 m)  Wt 193 lb 1.9 oz (87.599 kg)  BMI 41.78 kg/m2  SpO2 99%  Patient alert and oriented and in no cardiopulmonary distress.  HEENT: No facial asymmetry, EOMI,   oropharynx pink and moist.  Neck decreased ROM no JVD, no mass.  Chest: Clear to auscultation bilaterally.Decreased air entry bilaterally  CVS: S1, S2 no murmurs, no S3.Regular rate.  ABD: Soft non tender.   Ext: No edema  MS: Markedly decreased  ROM spine,  Adequate in shoulders, hips and knees.  Skin: Intact, no ulcerations or rash noted.  Psych: Good eye contact, normal affect. Memory intact not anxious but mildly  depressed appearing.  CNS: CN 2-12 intact, power,  normal throughout.no focal deficits noted.        Assessment & Plan:  Oral pain Intolerable left jaw pain, interferes with ability to eat, also chronic and worsening vertigo, ENT to eval and manage,  will also inc lyrica dose   Nicotine dependence Patient counseled for approximately 5 minutes regarding the health risks of ongoing nicotine use, specifically all types of cancer, heart disease, stroke and respiratory failure. The options available for help with cessation ,the behavioral changes to assist the process, and the option to either gradully reduce usage  Or abruptly stop.is also discussed. Pt is also encouraged to set specific goals in number of cigarettes used daily, as well as to set a quit date.  Number of cigarettes/cigars currently smoking daily: 7 .  Lumbar pain with radiation down left leg Uncontrolled.Toradol and depo medrol administered IM in the office , to be followed by a short course of oral prednisone and NSAIDS. Worsened and intolerable. Inc lyrica dose if unsuccessful refer to pain management  Essential hypertension Controlled, no change in medication DASH diet and commitment to daily physical activity for a minimum of 30 minutes discussed and encouraged, as a part of hypertension management. The importance of attaining a healthy weight is also discussed.  BP/Weight 10/16/2015 06/13/2015 03/13/2015 12/04/2014 11/01/2014 93/26/7124 04/28/997  Systolic BP 338 250 539 767 341 937 902  Diastolic BP 74 78 88 94 92 74 97  Wt. (Lbs) 193.12 206.12 216.4 223.12 225 220.08 233  BMI 41.78 44.59 46.82 48.27 48.68 47.61 47.04        Diabetes mellitus with neuropathy Adrienne Nelson is reminded of the importance of commitment to daily physical activity for 30  minutes or more, as able and the need to limit carbohydrate intake to 30 to 60 grams per meal to help with blood sugar control.  Improved with weight loss    Adrienne Nelson is reminded of the importance of daily foot exam, annual eye examination, and good blood sugar, blood pressure and cholesterol control.  Diabetic Labs Latest Ref Rng 10/10/2015 06/13/2015 06/08/2015 03/09/2015 10/02/2014  HbA1c <5.7 % 6.1(H) - 6.5(H)  6.3(H) 6.1(H)  Microalbumin <2.0 mg/dL - 2.1(H) - - -  Micro/Creat Ratio 0.0 - 30.0 mg/g - 9.2 - - -  Chol 0 - 200 mg/dL - - 150 167 180  HDL >=46 mg/dL - - 49 57 65  Calc LDL 0 - 99 mg/dL - - 54 66 82  Triglycerides <150 mg/dL - - 236(H) 218(H) 167(H)  Creatinine 0.50 - 0.99 mg/dL 0.70 - 0.61 0.66 0.69   BP/Weight 10/16/2015 06/13/2015 03/13/2015 12/04/2014 11/01/2014 16/09/9603 04/24/980  Systolic BP 191 478 295 621 308 657 846  Diastolic BP 74 78 88 94 92 74 97  Wt. (Lbs) 193.12 206.12 216.4 223.12 225 220.08 233  BMI 41.78 44.59 46.82 48.27 48.68 47.61 47.04   Foot/eye exam completion dates 06/13/2015 08/20/2010  Foot exam Order - yes  Foot Form Completion Done -         Morbid obesity Improved, unfortunately due to inability to eat Patient re-educated about  the importance of commitment to a  minimum of 150 minutes of exercise per week.  The importance of healthy food choices with portion control discussed. Encouraged to start a food diary, count calories and to consider  joining a support group. Sample diet sheets offered. Goals set by the patient for the next several months.   Weight /BMI 10/16/2015 06/13/2015 03/13/2015  WEIGHT 193 lb 1.9 oz 206 lb 1.9 oz 216 lb 6.4 oz  HEIGHT 4\' 9"  4\' 9"  4\' 9"   BMI 41.78 kg/m2 44.59 kg/m2 46.82 kg/m2    Current exercise per week 60 minutes.   Depression Controlled, no change in medication   Dysuria 1 month h/o recurrent dysuria , will test for infection, if persistent sympotms and no infection, need urology eval

## 2015-10-16 NOTE — Patient Instructions (Addendum)
F/u in3.5 month, call if you need me before  New medication, lyrica is prescribed  To help with pain  depo medrol in office today and 5 days of prednisone for left leg pain, if des not help call for referral back to Dr Francesco Runner  You are referred to Dr Benjamine Mola for evaluation and management of oral pain and daily vertigo  Urine specimen today for infection, nurse will duiscuss rsult and management after obtained and I have reviewed  Flu vaccine today  Blood sugar improved  Sign for OV by dr Redmond Pulling , ENT in May 2016 TSH and hep C to be added to lab, holding off on referral for excess sweating at thsi time as discussed  Fasting lipid, cmp and EGFr , hBA1C in 3.5 month

## 2015-10-16 NOTE — Telephone Encounter (Signed)
Patient called regarding a question about her medication, Patient is requesting a call back today. Please Adrienne Nelson

## 2015-10-17 ENCOUNTER — Other Ambulatory Visit: Payer: Self-pay

## 2015-10-17 DIAGNOSIS — K1379 Other lesions of oral mucosa: Secondary | ICD-10-CM

## 2015-10-17 MED ORDER — LIDOCAINE VISCOUS 2 % MT SOLN: OROMUCOSAL | Status: DC

## 2015-10-17 NOTE — Telephone Encounter (Signed)
Understands about her med changes

## 2015-10-18 LAB — TSH: TSH: 3.247 u[IU]/mL (ref 0.350–4.500)

## 2015-10-18 LAB — HEPATITIS C ANTIBODY: HCV Ab: NEGATIVE

## 2015-10-18 LAB — URINE CULTURE
Colony Count: NO GROWTH
ORGANISM ID, BACTERIA: NO GROWTH

## 2015-11-11 ENCOUNTER — Telehealth: Payer: Self-pay | Admitting: Family Medicine

## 2015-11-11 ENCOUNTER — Encounter: Payer: Self-pay | Admitting: Family Medicine

## 2015-11-11 DIAGNOSIS — M79605 Pain in left leg: Secondary | ICD-10-CM | POA: Insufficient documentation

## 2015-11-11 DIAGNOSIS — R300 Dysuria: Secondary | ICD-10-CM | POA: Insufficient documentation

## 2015-11-11 DIAGNOSIS — M545 Low back pain: Secondary | ICD-10-CM | POA: Insufficient documentation

## 2015-11-11 NOTE — Assessment & Plan Note (Signed)
Controlled, no change in medication  

## 2015-11-11 NOTE — Assessment & Plan Note (Signed)
Uncontrolled.Toradol and depo medrol administered IM in the office , to be followed by a short course of oral prednisone and NSAIDS. Worsened and intolerable. Inc lyrica dose if unsuccessful refer to pain management

## 2015-11-11 NOTE — Assessment & Plan Note (Addendum)
Intolerable left jaw pain, interferes with ability to eat, also chronic and worsening vertigo, ENT to eval and manage, will also inc lyrica dose

## 2015-11-11 NOTE — Telephone Encounter (Signed)
At Oct visit pt had c/o dysuria intermittently for 1 month, her urine was not infected. If she is still symptomatic, pls refer her to urology evaluate chronic  Dysuria, if she agrees. Thanks

## 2015-11-11 NOTE — Assessment & Plan Note (Signed)
Controlled, no change in medication DASH diet and commitment to daily physical activity for a minimum of 30 minutes discussed and encouraged, as a part of hypertension management. The importance of attaining a healthy weight is also discussed.  BP/Weight 10/16/2015 06/13/2015 03/13/2015 12/04/2014 11/01/2014 [id number]  Systolic BP 123456 [id number] 0000000 XX123456 [id number] 0000000  Diastolic BP 74 78 88 94 92 74 97  Wt. (Lbs) 193.12 206.12 216.4 223.12 225 220.08 233  BMI 41.78 44.59 46.82 48.27 48.68 47.61 47.04

## 2015-11-11 NOTE — Assessment & Plan Note (Signed)
1 month h/o recurrent dysuria , will test for infection, if persistent sympotms and no infection, need urology eval

## 2015-11-11 NOTE — Assessment & Plan Note (Addendum)
Adrienne Nelson is reminded of the importance of commitment to daily physical activity for 30 minutes or more, as able and the need to limit carbohydrate intake to 30 to 60 grams per meal to help with blood sugar control.  Improved with weight loss    Adrienne Nelson is reminded of the importance of daily foot exam, annual eye examination, and good blood sugar, blood pressure and cholesterol control.  Diabetic Labs Latest Ref Rng 10/10/2015 06/13/2015 06/08/2015 03/09/2015 10/02/2014  HbA1c <5.7 % 6.1(H) - 6.5(H) 6.3(H) 6.1(H)  Microalbumin <2.0 mg/dL - 2.1(H) - - -  Micro/Creat Ratio 0.0 - 30.0 mg/g - 9.2 - - -  Chol 0 - 200 mg/dL - - 150 167 180  HDL >=46 mg/dL - - 49 57 65  Calc LDL 0 - 99 mg/dL - - 54 66 82  Triglycerides <150 mg/dL - - 236(H) 218(H) 167(H)  Creatinine 0.50 - 0.99 mg/dL 0.70 - 0.61 0.66 0.69   BP/Weight 10/16/2015 06/13/2015 03/13/2015 12/04/2014 11/01/2014 [id number]  Systolic BP 123456 [id number] 0000000 XX123456 [id number] 0000000  Diastolic BP 74 78 88 94 92 74 97  Wt. (Lbs) 193.12 206.12 216.4 223.12 225 220.08 233  BMI 41.78 44.59 46.82 48.27 48.68 47.61 47.04   Foot/eye exam completion dates 06/13/2015 08/20/2010  Foot exam Order - yes  Foot Form Completion Done -

## 2015-11-11 NOTE — Assessment & Plan Note (Signed)
Improved, unfortunately due to inability to eat Patient re-educated about  the importance of commitment to a  minimum of 150 minutes of exercise per week.  The importance of healthy food choices with portion control discussed. Encouraged to start a food diary, count calories and to consider  joining a support group. Sample diet sheets offered. Goals set by the patient for the next several months.   Weight /BMI 10/16/2015 06/13/2015 03/13/2015  WEIGHT 193 lb 1.9 oz 206 lb 1.9 oz 216 lb 6.4 oz  HEIGHT 4\' 9"  4\' 9"  4\' 9"   BMI 41.78 kg/m2 44.59 kg/m2 46.82 kg/m2    Current exercise per week 60 minutes.

## 2015-11-11 NOTE — Assessment & Plan Note (Addendum)

## 2015-11-12 NOTE — Telephone Encounter (Signed)
States she can't tell any different from taking the lyrica. Wants to know if she should just stop it or if you want to increase the dose.   Also- the potassium daily is helping her cramps but she is still having some. Should she increase to 2 daily?  Told her I would be in touch with her next week but to keep taking everything as prescribed for right now

## 2015-11-14 ENCOUNTER — Telehealth: Payer: Self-pay | Admitting: Family Medicine

## 2015-11-14 NOTE — Telephone Encounter (Signed)
Wants to know if you will increase the lyrica because the 25mg  tid doesn't help at all. Or do you just want her to d/c it? Please advise

## 2015-11-14 NOTE — Telephone Encounter (Signed)
Adrienne Nelson has questions about her medication Lyrica

## 2015-11-19 ENCOUNTER — Other Ambulatory Visit: Payer: Self-pay

## 2015-11-19 MED ORDER — POTASSIUM CHLORIDE CRYS ER 20 MEQ PO TBCR: 20.0000 meq | EXTENDED_RELEASE_TABLET | Freq: Two times a day (BID) | ORAL | Status: DC

## 2015-11-19 MED ORDER — PREGABALIN 50 MG PO CAPS: 50.0000 mg | ORAL_CAPSULE | Freq: Three times a day (TID) | ORAL | Status: DC

## 2015-11-19 NOTE — Telephone Encounter (Signed)
Patient aware per previous message

## 2015-11-19 NOTE — Telephone Encounter (Signed)
Detailed message has been sent on this pls let me know if you have not seen original response  , earlier today, thanks

## 2015-11-19 NOTE — Telephone Encounter (Signed)
Patient aware and meds sent 

## 2015-11-19 NOTE — Telephone Encounter (Signed)
Yes increase potassium to twice daily, pls send in new script for 20 meq twice daily #60  Refill 4 , needs rept chem 7 in 2 weeks also please and add magnesium  Level dx cramps  also increase lyrica dose to 50 mg 3 times daily is my recommendation, pls send in new dose for 3 months  and notify pharmacy, needs f/u by mid to end Jan if none already in place pls

## 2015-11-29 ENCOUNTER — Ambulatory Visit (INDEPENDENT_AMBULATORY_CARE_PROVIDER_SITE_OTHER): Payer: MEDICARE | Admitting: Otolaryngology

## 2015-11-29 DIAGNOSIS — R070 Pain in throat: Secondary | ICD-10-CM | POA: Diagnosis not present

## 2015-11-29 DIAGNOSIS — H9209 Otalgia, unspecified ear: Secondary | ICD-10-CM

## 2015-11-30 ENCOUNTER — Other Ambulatory Visit (INDEPENDENT_AMBULATORY_CARE_PROVIDER_SITE_OTHER): Payer: Self-pay | Admitting: Otolaryngology

## 2015-11-30 DIAGNOSIS — D487 Neoplasm of uncertain behavior of other specified sites: Secondary | ICD-10-CM

## 2015-12-04 ENCOUNTER — Other Ambulatory Visit: Payer: Self-pay | Admitting: Family Medicine

## 2015-12-05 ENCOUNTER — Ambulatory Visit (HOSPITAL_COMMUNITY)
Admission: RE | Admit: 2015-12-05 | Discharge: 2015-12-05 | Disposition: A | Discharge status: Home / Self Care | Payer: MEDICARE | Source: Ambulatory Visit | Attending: Otolaryngology | Admitting: Otolaryngology

## 2015-12-05 DIAGNOSIS — I2510 Atherosclerotic heart disease of native coronary artery without angina pectoris: Secondary | ICD-10-CM | POA: Diagnosis not present

## 2015-12-05 DIAGNOSIS — N63 Unspecified lump in breast: Secondary | ICD-10-CM | POA: Diagnosis not present

## 2015-12-05 DIAGNOSIS — D487 Neoplasm of uncertain behavior of other specified sites: Secondary | ICD-10-CM

## 2015-12-05 DIAGNOSIS — C140 Malignant neoplasm of pharynx, unspecified: Secondary | ICD-10-CM | POA: Diagnosis not present

## 2015-12-05 DIAGNOSIS — C760 Malignant neoplasm of head, face and neck: Secondary | ICD-10-CM | POA: Diagnosis not present

## 2015-12-05 DIAGNOSIS — I700 Atherosclerosis of aorta: Secondary | ICD-10-CM | POA: Diagnosis not present

## 2015-12-05 DIAGNOSIS — K1379 Other lesions of oral mucosa: Secondary | ICD-10-CM | POA: Diagnosis not present

## 2015-12-05 LAB — GLUCOSE, CAPILLARY: GLUCOSE-CAPILLARY: 107 mg/dL — AB (ref 65–99)

## 2015-12-05 MED ORDER — FLUDEOXYGLUCOSE F - 18 (FDG) INJECTION
10.1200 | Freq: Once | INTRAVENOUS | Status: AC | PRN
  Administered 2015-12-05: 10.12 via INTRAVENOUS

## 2015-12-06 ENCOUNTER — Other Ambulatory Visit: Payer: Self-pay | Admitting: Family Medicine

## 2015-12-10 ENCOUNTER — Other Ambulatory Visit (INDEPENDENT_AMBULATORY_CARE_PROVIDER_SITE_OTHER): Payer: Self-pay | Admitting: *Deleted

## 2015-12-10 ENCOUNTER — Telehealth: Payer: Self-pay | Admitting: Family Medicine

## 2015-12-10 DIAGNOSIS — R948 Abnormal results of function studies of other organs and systems: Secondary | ICD-10-CM

## 2015-12-10 DIAGNOSIS — K5190 Ulcerative colitis, unspecified, without complications: Secondary | ICD-10-CM

## 2015-12-10 NOTE — Telephone Encounter (Signed)
Diag Mammo is scheduled on Tues on Dec 27th at 8:30 and she is aware

## 2015-12-10 NOTE — Telephone Encounter (Signed)
Referral has been made to Dr. Laural Golden and a copy of PET scan was faxed over

## 2015-12-10 NOTE — Telephone Encounter (Signed)
pls call pt, needs mammogram scheduled and done asap. None since 2014 Recent PET scan by ENT suggests STRONGLY the need for mammogram on right breast, will order for [facility], not sure if they will want diagnostic but will enter regualr ?? pls ask

## 2015-12-10 NOTE — Telephone Encounter (Signed)
Pls also let pt know that i recommend she see Dr Laural Golden based on abnormal PET scan. I am aware that she had a normal colonoscopy in 2015, but still recommend a consultation with him. I have entered the referral and will also send a msg to Dr Laural Golden, pls fax the PET scan report when calling for appt, thanks!  ??/ pls ask

## 2015-12-12 ENCOUNTER — Encounter (HOSPITAL_COMMUNITY)
Admission: RE | Disposition: A | Discharge status: Home / Self Care | Payer: Self-pay | Source: Ambulatory Visit | Attending: Internal Medicine

## 2015-12-12 ENCOUNTER — Ambulatory Visit (HOSPITAL_COMMUNITY)
Admission: RE | Admit: 2015-12-12 | Discharge: 2015-12-12 | Disposition: A | Discharge status: Home / Self Care | Payer: MEDICARE | Source: Ambulatory Visit | Attending: Internal Medicine | Admitting: Internal Medicine

## 2015-12-12 ENCOUNTER — Encounter (HOSPITAL_COMMUNITY): Payer: Self-pay | Admitting: *Deleted

## 2015-12-12 DIAGNOSIS — Z7984 Long term (current) use of oral hypoglycemic drugs: Secondary | ICD-10-CM | POA: Diagnosis not present

## 2015-12-12 DIAGNOSIS — K5730 Diverticulosis of large intestine without perforation or abscess without bleeding: Secondary | ICD-10-CM | POA: Diagnosis not present

## 2015-12-12 DIAGNOSIS — R933 Abnormal findings on diagnostic imaging of other parts of digestive tract: Secondary | ICD-10-CM | POA: Diagnosis not present

## 2015-12-12 DIAGNOSIS — Z923 Personal history of irradiation: Secondary | ICD-10-CM | POA: Diagnosis not present

## 2015-12-12 DIAGNOSIS — Z85819 Personal history of malignant neoplasm of unspecified site of lip, oral cavity, and pharynx: Secondary | ICD-10-CM | POA: Diagnosis not present

## 2015-12-12 DIAGNOSIS — E119 Type 2 diabetes mellitus without complications: Secondary | ICD-10-CM | POA: Diagnosis not present

## 2015-12-12 DIAGNOSIS — Z86010 Personal history of colonic polyps: Secondary | ICD-10-CM | POA: Diagnosis not present

## 2015-12-12 DIAGNOSIS — I10 Essential (primary) hypertension: Secondary | ICD-10-CM | POA: Insufficient documentation

## 2015-12-12 DIAGNOSIS — K5190 Ulcerative colitis, unspecified, without complications: Secondary | ICD-10-CM | POA: Diagnosis not present

## 2015-12-12 DIAGNOSIS — Z7982 Long term (current) use of aspirin: Secondary | ICD-10-CM | POA: Insufficient documentation

## 2015-12-12 DIAGNOSIS — M1711 Unilateral primary osteoarthritis, right knee: Secondary | ICD-10-CM | POA: Insufficient documentation

## 2015-12-12 DIAGNOSIS — Z800 Family history of malignant neoplasm of digestive organs: Secondary | ICD-10-CM | POA: Diagnosis not present

## 2015-12-12 DIAGNOSIS — Z79899 Other long term (current) drug therapy: Secondary | ICD-10-CM | POA: Diagnosis not present

## 2015-12-12 DIAGNOSIS — F17210 Nicotine dependence, cigarettes, uncomplicated: Secondary | ICD-10-CM | POA: Insufficient documentation

## 2015-12-12 DIAGNOSIS — Z96652 Presence of left artificial knee joint: Secondary | ICD-10-CM | POA: Diagnosis not present

## 2015-12-12 DIAGNOSIS — F329 Major depressive disorder, single episode, unspecified: Secondary | ICD-10-CM | POA: Diagnosis not present

## 2015-12-12 DIAGNOSIS — R935 Abnormal findings on diagnostic imaging of other abdominal regions, including retroperitoneum: Secondary | ICD-10-CM | POA: Diagnosis not present

## 2015-12-12 DIAGNOSIS — E7800 Pure hypercholesterolemia, unspecified: Secondary | ICD-10-CM | POA: Insufficient documentation

## 2015-12-12 DIAGNOSIS — J449 Chronic obstructive pulmonary disease, unspecified: Secondary | ICD-10-CM | POA: Insufficient documentation

## 2015-12-12 DIAGNOSIS — Z801 Family history of malignant neoplasm of trachea, bronchus and lung: Secondary | ICD-10-CM | POA: Diagnosis not present

## 2015-12-12 DIAGNOSIS — R948 Abnormal results of function studies of other organs and systems: Secondary | ICD-10-CM

## 2015-12-12 HISTORY — DX: Chronic obstructive pulmonary disease, unspecified: J44.9

## 2015-12-12 HISTORY — PX: FLEXIBLE SIGMOIDOSCOPY: SHX5431

## 2015-12-12 LAB — GLUCOSE, CAPILLARY: GLUCOSE-CAPILLARY: 103 mg/dL — AB (ref 65–99)

## 2015-12-12 SURGERY — SIGMOIDOSCOPY, FLEXIBLE
Anesthesia: Moderate Sedation

## 2015-12-12 MED ORDER — SODIUM CHLORIDE 0.9 % IV SOLN
INTRAVENOUS | Status: DC
  Administered 2015-12-12: 1000 mL via INTRAVENOUS

## 2015-12-12 MED ORDER — STERILE WATER FOR IRRIGATION IR SOLN
Status: DC | PRN
  Administered 2015-12-12: 10:00:00

## 2015-12-12 MED ORDER — MEPERIDINE HCL 25 MG/ML IJ SOLN
INTRAMUSCULAR | Status: DC | PRN
  Administered 2015-12-12 (×2): 25 mg via INTRAVENOUS

## 2015-12-12 MED ORDER — MEPERIDINE HCL 50 MG/ML IJ SOLN
INTRAMUSCULAR | Status: AC
  Filled 2015-12-12: qty 1

## 2015-12-12 MED ORDER — MIDAZOLAM HCL 5 MG/5ML IJ SOLN
INTRAMUSCULAR | Status: DC | PRN
  Administered 2015-12-12: 2 mg via INTRAVENOUS
  Administered 2015-12-12: 3 mg via INTRAVENOUS

## 2015-12-12 MED ORDER — MIDAZOLAM HCL 5 MG/5ML IJ SOLN
INTRAMUSCULAR | Status: AC
  Filled 2015-12-12: qty 10

## 2015-12-12 NOTE — Op Note (Signed)
FLEXIBLE SIGMOIDOSCOPY  PROCEDURE REPORT  PATIENT:  Adrienne Nelson  MR#:  [id number] Birthdate:  [birth year], 64 y.o., female Endoscopist:  Dr. Rogene Houston, MD Referred By:  Dr.  Tula Nakayama, MD Procedure Date: 12/12/2015  Procedure:    Flexible sigmoidoscopy   indication:  Patient is 64 year old Caucasian female with multiple medical problems who also has history of ulcerative colitis in remission. Her last colonoscopy was in April 2015 with removal of small rectal tubular adenoma. She has remote history of throat cancer treated with radiation therapy. Lately she's had intractable throat pain. She is being evaluated by Dr. Benjamine Mola and underwent PET scan which revealed  Increased rectal activity. She is therefore undergoing diagnostic flexible sigmoidoscopy. She denies diarrhea or rectal bleeding.  Informed Consent:  The procedure and risks were reviewed with the patient and informed consent was obtained.  Medications:  Demerol 50 mg IV Versed 5 mg IV  Description of procedure:  After a digital rectal exam was performed, that colonoscope was advanced from the anus through the rectum and colon to junction of sigmoid and descending colon. The mucosal surfaces were carefully surveyed utilizing scope tip to flexion to facilitate fold flattening as needed. The scope was pulled down into the rectum where a thorough exam including retroflexion was performed.  Findings:  . Prep satisfactory. Few small diverticula noted at sigmoid colon but mucosa was normal. Normal rectal mucosa. Rectum distended very well. Unremarkable anorectal junction.    Therapeutic/Diagnostic Maneuvers Performed:  None  Complications:   None  EBL: None  Impression:  Few small diverticula at sigmoid colon otherwise normal flexible sigmoidoscopy.  No evidence of proctitis, polyp or rectal mass.  Recommendations:  Standard instructions given. Follow with Dr. Tula Nakayama.  Aija Scarfo U  12/12/2015  9:53 AM  CC: Dr. Tula Nakayama, MD & Dr. Rayne Du ref. provider found CC: Dr. Leta Baptist, MD

## 2015-12-12 NOTE — H&P (Signed)
Adrienne Nelson is an 64 y.o. female.   Chief Complaint:  Patient is here for flexible sigmoidoscopy. HPI:  Patient is 64 year old Caucasian female who has has history of throat cancer treated with radiation therapy in 2003. She's been having intractable throat pain. She underwent PET scan by Dr. Benjamine Mola revealing increased activity in the rectum. She is therefore undergoing diagnostic flexible sigmoidoscopy. She has history of ulcerative colitis but has remained in remission without any medications. She had colonoscopy in April 2015 with removal of small rectal polyp and was tubular adenoma.  history significant for CRC in father who was in his 59s at the time of diagnosis.  Past Medical History  Diagnosis Date  . Hand pain     bilateral    . Arthritis of knee, right   . Depression   . Hearing loss   . Ulcerative colitis   . Hyperlipidemia   . Type 2 diabetes mellitus without complications ([place])   . Hypertension   . Hypercholesteremia   . IBS (irritable bowel syndrome)   . Diverticulosis   . Kidney stones   . Throat cancer [facility]) 2003    radiation treatment  . COPD (chronic obstructive pulmonary disease) [facility])     Past Surgical History  Procedure Laterality Date  . Cesarean section    . Cesarean section    . Carpal tunnel release    . Cholecystectomy    . Total abdominal hysterectomy    . Left trigger finger surgery  Dec. 2011  . Colonoscopy N/A 04/06/2014    Procedure: COLONOSCOPY;  Surgeon: Rogene Houston, MD;  Location: [facility];  Service: Endoscopy;  Laterality: N/A;  830  . Joint replacement Right 09/05/2014    knee  . Meniscus repair Left 2016    Family History  Problem Relation Age of Onset  . Lung cancer Mother 6  . Colon cancer Father 71   Social History:  reports that she has been smoking Cigarettes.  She has a 2 pack-year smoking history. She does not have any smokeless tobacco history on file. She reports that she does not drink alcohol or use illicit  drugs.  Allergies:  Allergies  Allergen Reactions  . Penicillins Anaphylaxis  . Gabapentin Other (See Comments)    States told "not to take this " while recently hospitalized  . Ibuprofen Other (See Comments)    Irritates stomach  . Serax [Oxazepam] Other (See Comments)    nightmares    Medications Prior to Admission  Medication Sig Dispense Refill  . amLODipine (NORVASC) 5 MG tablet TAKE ONE TABLET BY MOUTH ONCE DAILY 30 tablet 3  . aspirin (ASPIRIN LOW DOSE) 81 MG EC tablet Take 81 mg by mouth daily.      . Calcium Carbonate-Vitamin D (CALCIUM 600+D) 600-400 MG-UNIT per tablet Take 1 tablet by mouth 3 (three) times daily with meals.      . cloNIDine (CATAPRES) 0.3 MG tablet TAKE ONE TABLET BY MOUTH TWICE DAILY 60 tablet 3  . docusate sodium (COLACE) 100 MG capsule Take 200 mg by mouth at bedtime.    Marland Kitchen FLUoxetine (PROZAC) 40 MG capsule TAKE ONE CAPSULE BY MOUTH ONCE DAILY 30 capsule 2  . HYDROcodone-acetaminophen (NORCO) 10-325 MG tablet Take 1 tablet by mouth every 6 (six) hours as needed for moderate pain.     Marland Kitchen lidocaine (XYLOCAINE) 2 % solution Apply to affected area every 2 hours as needed (dose change) 300 mL 2  . lisinopril (PRINIVIL,ZESTRIL) 40 MG tablet  TAKE ONE TABLET BY MOUTH ONCE DAILY 30 tablet 2  . metFORMIN (GLUCOPHAGE) 500 MG tablet TAKE ONE TABLET BY MOUTH ONCE DAILY WITH BREAKFAST 30 tablet 3  . Multiple Vitamin (MULTIVITAMIN) tablet Take 1 tablet by mouth daily.      . potassium chloride SA (KLOR-CON M20) 20 MEQ tablet Take 1 tablet (20 mEq total) by mouth 2 (two) times daily. 60 tablet 4  . pregabalin (LYRICA) 50 MG capsule Take 1 capsule (50 mg total) by mouth 3 (three) times daily. 90 capsule 2  . rOPINIRole (REQUIP) 0.25 MG tablet TAKE ONE TABLET BY MOUTH ONCE DAILY AT BEDTIME 30 tablet 3  . simvastatin (ZOCOR) 40 MG tablet TAKE ONE TABLET BY MOUTH AT BEDTIME 30 tablet 2  . traZODone (DESYREL) 100 MG tablet Two tablets at bedtime 60 tablet 5  . venlafaxine XR  (EFFEXOR XR) 75 MG 24 hr capsule Take 1 capsule (75 mg total) by mouth daily with breakfast. 30 capsule 3    Results for orders placed or performed during the hospital encounter of 12/12/15 (from the past 48 hour(s))  Glucose, capillary     Status: Abnormal   Collection Time: 12/12/15  9:19 AM  Result Value Ref Range   Glucose-Capillary 103 (H) 65 - 99 mg/dL   No results found.  ROS  Blood pressure 173/97, pulse 79, temperature 98.2 F (36.8 C), temperature source Oral, resp. rate 17, SpO2 95 %. Physical Exam  Constitutional: She appears well-developed and well-nourished.  HENT:  Mouth/Throat: Oropharynx is clear and moist.  Eyes: Conjunctivae are normal. No scleral icterus.  Neck: No thyromegaly present.  Cardiovascular: Normal rate, regular rhythm and normal heart sounds.   No murmur heard. Respiratory: Effort normal and breath sounds normal.  GI: She exhibits no distension and no mass. There is no tenderness.  Musculoskeletal: She exhibits no edema.  Lymphadenopathy:    She has no cervical adenopathy.  Neurological: She is alert.  Skin: Skin is warm and dry.     Assessment/Plan  Abnormal PET scan revealing increase activity in the rectum.  History of ulcerative colitis in remission. Last colonoscopy was in April 2015 with removal of small rectal tubular adenoma. Diagnostic flexible sigmoidoscopy.  REHMAN,NAJEEB U 12/12/2015, 9:36 AM

## 2015-12-12 NOTE — Discharge Instructions (Signed)
Resume usual medications and diet.  No driving for 24 hours.  Next colonoscopy in April 20/20 as planned.  Colonoscopy, Care After These instructions give you information on caring for yourself after your procedure. Your doctor may also give you more specific instructions. Call your doctor if you have any problems or questions after your procedure. HOME CARE  Do not drive for 24 hours.  Do not sign important papers or use machinery for 24 hours.  You may shower.  You may go back to your usual activities, but go slower for the first 24 hours.  Take rest breaks often during the first 24 hours.  Walk around or use warm packs on your belly (abdomen) if you have belly cramping or gas.  Drink enough fluids to keep your pee (urine) clear or pale yellow.  Resume your normal diet. Avoid heavy or fried foods.  Avoid drinking alcohol for 24 hours or as told by your doctor.  Only take medicines as told by your doctor. If a tissue sample (biopsy) was taken during the procedure:   Do not take aspirin or blood thinners for 7 days, or as told by your doctor.  Do not drink alcohol for 7 days, or as told by your doctor.  Eat soft foods for the first 24 hours. GET HELP IF: You still have a small amount of blood in your poop (stool) 2-3 days after the procedure. GET HELP RIGHT AWAY IF:  You have more than a small amount of blood in your poop.  You see clumps of tissue (blood clots) in your poop.  Your belly is puffy (swollen).  You feel sick to your stomach (nauseous) or throw up (vomit).  You have a fever.  You have belly pain that gets worse and medicine does not help. MAKE SURE YOU:  Understand these instructions.  Will watch your condition.  Will get help right away if you are not doing well or get worse.   This information is not intended to replace advice given to you by your health care provider. Make sure you discuss any questions you have with your health care  provider.   Document Released: 01/10/2011 Document Revised: 12/13/2013 Document Reviewed: 08/15/2013 Elsevier Interactive Patient Education [employer].

## 2015-12-13 ENCOUNTER — Ambulatory Visit (INDEPENDENT_AMBULATORY_CARE_PROVIDER_SITE_OTHER): Payer: MEDICARE | Admitting: Internal Medicine

## 2015-12-18 ENCOUNTER — Other Ambulatory Visit: Payer: Self-pay | Admitting: Family Medicine

## 2015-12-18 ENCOUNTER — Ambulatory Visit (HOSPITAL_COMMUNITY)
Admission: RE | Admit: 2015-12-18 | Discharge: 2015-12-18 | Disposition: A | Discharge status: Home / Self Care | Payer: MEDICARE | Source: Ambulatory Visit | Attending: Family Medicine | Admitting: Family Medicine

## 2015-12-18 DIAGNOSIS — R948 Abnormal results of function studies of other organs and systems: Secondary | ICD-10-CM | POA: Insufficient documentation

## 2015-12-18 DIAGNOSIS — N63 Unspecified lump in unspecified breast: Secondary | ICD-10-CM

## 2015-12-19 ENCOUNTER — Other Ambulatory Visit: Payer: Self-pay

## 2015-12-19 ENCOUNTER — Encounter (HOSPITAL_COMMUNITY): Payer: Self-pay | Admitting: Internal Medicine

## 2015-12-19 ENCOUNTER — Other Ambulatory Visit: Payer: Self-pay | Admitting: Family Medicine

## 2015-12-19 DIAGNOSIS — K1379 Other lesions of oral mucosa: Secondary | ICD-10-CM

## 2015-12-19 DIAGNOSIS — R928 Other abnormal and inconclusive findings on diagnostic imaging of breast: Secondary | ICD-10-CM

## 2015-12-19 MED ORDER — LIDOCAINE VISCOUS 2 % MT SOLN: OROMUCOSAL | Status: AC

## 2015-12-20 ENCOUNTER — Other Ambulatory Visit: Payer: Self-pay | Admitting: Family Medicine

## 2015-12-20 DIAGNOSIS — R928 Other abnormal and inconclusive findings on diagnostic imaging of breast: Secondary | ICD-10-CM

## 2015-12-23 ENCOUNTER — Other Ambulatory Visit: Payer: Self-pay | Admitting: Family Medicine

## 2015-12-27 ENCOUNTER — Ambulatory Visit (INDEPENDENT_AMBULATORY_CARE_PROVIDER_SITE_OTHER): Payer: MEDICARE | Admitting: Otolaryngology

## 2015-12-27 DIAGNOSIS — D3705 Neoplasm of uncertain behavior of pharynx: Secondary | ICD-10-CM | POA: Diagnosis not present

## 2015-12-31 ENCOUNTER — Other Ambulatory Visit: Payer: Self-pay

## 2015-12-31 MED ORDER — PREGABALIN 50 MG PO CAPS: 50.0000 mg | ORAL_CAPSULE | Freq: Three times a day (TID) | ORAL | Status: DC

## 2016-01-01 ENCOUNTER — Other Ambulatory Visit: Payer: Self-pay | Admitting: Otolaryngology

## 2016-01-02 ENCOUNTER — Encounter (HOSPITAL_BASED_OUTPATIENT_CLINIC_OR_DEPARTMENT_OTHER): Payer: Self-pay | Admitting: *Deleted

## 2016-01-02 ENCOUNTER — Other Ambulatory Visit: Payer: Self-pay | Admitting: Family Medicine

## 2016-01-02 ENCOUNTER — Telehealth: Payer: Self-pay | Admitting: Family Medicine

## 2016-01-02 DIAGNOSIS — G4700 Insomnia, unspecified: Secondary | ICD-10-CM

## 2016-01-02 MED ORDER — TRAZODONE HCL 100 MG PO TABS: ORAL_TABLET | ORAL | Status: DC

## 2016-01-02 NOTE — Telephone Encounter (Signed)
Med sent in.

## 2016-01-02 NOTE — Telephone Encounter (Signed)
Bethena Roys is asking if we can refill Sandras traZODone (DESYREL) 100 MG tablet  With a 3 month supply due to new insurance, please advise?

## 2016-01-03 ENCOUNTER — Other Ambulatory Visit: Payer: Self-pay

## 2016-01-03 ENCOUNTER — Encounter (HOSPITAL_COMMUNITY)
Admission: RE | Admit: 2016-01-03 | Discharge: 2016-01-03 | Disposition: A | Discharge status: Home / Self Care | Payer: MEDICARE | Source: Ambulatory Visit | Attending: Otolaryngology | Admitting: Otolaryngology

## 2016-01-03 DIAGNOSIS — Z01818 Encounter for other preprocedural examination: Secondary | ICD-10-CM | POA: Insufficient documentation

## 2016-01-03 DIAGNOSIS — J359 Chronic disease of tonsils and adenoids, unspecified: Secondary | ICD-10-CM | POA: Diagnosis not present

## 2016-01-03 DIAGNOSIS — I4510 Unspecified right bundle-branch block: Secondary | ICD-10-CM | POA: Insufficient documentation

## 2016-01-03 DIAGNOSIS — E119 Type 2 diabetes mellitus without complications: Secondary | ICD-10-CM | POA: Diagnosis not present

## 2016-01-03 DIAGNOSIS — I10 Essential (primary) hypertension: Secondary | ICD-10-CM | POA: Insufficient documentation

## 2016-01-03 DIAGNOSIS — Z01812 Encounter for preprocedural laboratory examination: Secondary | ICD-10-CM | POA: Insufficient documentation

## 2016-01-03 LAB — BASIC METABOLIC PANEL
ANION GAP: 8 (ref 5–15)
BUN: 10 mg/dL (ref 6–20)
CALCIUM: 9.5 mg/dL (ref 8.9–10.3)
CO2: 29 mmol/L (ref 22–32)
Chloride: 103 mmol/L (ref 101–111)
Creatinine, Ser: 0.57 mg/dL (ref 0.44–1.00)
GFR calc Af Amer: >60 mL/min (ref >60)
GFR calc non Af Amer: >60 mL/min (ref >60)
GLUCOSE: 105 mg/dL — AB (ref 65–99)
Potassium: 4.1 mmol/L (ref 3.5–5.1)
Sodium: 140 mmol/L (ref 135–145)

## 2016-01-07 ENCOUNTER — Encounter (HOSPITAL_BASED_OUTPATIENT_CLINIC_OR_DEPARTMENT_OTHER): Payer: Self-pay | Admitting: Anesthesiology

## 2016-01-07 ENCOUNTER — Ambulatory Visit (HOSPITAL_BASED_OUTPATIENT_CLINIC_OR_DEPARTMENT_OTHER)
Admission: RE | Admit: 2016-01-07 | Discharge: 2016-01-07 | Disposition: A | Discharge status: Home / Self Care | Payer: MEDICARE | Source: Ambulatory Visit | Attending: Otolaryngology | Admitting: Otolaryngology

## 2016-01-07 ENCOUNTER — Ambulatory Visit (HOSPITAL_BASED_OUTPATIENT_CLINIC_OR_DEPARTMENT_OTHER): Payer: MEDICARE | Admitting: Anesthesiology

## 2016-01-07 ENCOUNTER — Encounter (HOSPITAL_BASED_OUTPATIENT_CLINIC_OR_DEPARTMENT_OTHER)
Admission: RE | Disposition: A | Discharge status: Home / Self Care | Payer: Self-pay | Source: Ambulatory Visit | Attending: Otolaryngology

## 2016-01-07 DIAGNOSIS — C099 Malignant neoplasm of tonsil, unspecified: Secondary | ICD-10-CM | POA: Diagnosis not present

## 2016-01-07 DIAGNOSIS — M1990 Unspecified osteoarthritis, unspecified site: Secondary | ICD-10-CM | POA: Diagnosis not present

## 2016-01-07 DIAGNOSIS — D3709 Neoplasm of uncertain behavior of other specified sites of the oral cavity: Secondary | ICD-10-CM | POA: Diagnosis not present

## 2016-01-07 DIAGNOSIS — Z923 Personal history of irradiation: Secondary | ICD-10-CM | POA: Insufficient documentation

## 2016-01-07 DIAGNOSIS — I10 Essential (primary) hypertension: Secondary | ICD-10-CM | POA: Insufficient documentation

## 2016-01-07 DIAGNOSIS — J449 Chronic obstructive pulmonary disease, unspecified: Secondary | ICD-10-CM | POA: Insufficient documentation

## 2016-01-07 DIAGNOSIS — F17210 Nicotine dependence, cigarettes, uncomplicated: Secondary | ICD-10-CM | POA: Diagnosis not present

## 2016-01-07 DIAGNOSIS — Z85819 Personal history of malignant neoplasm of unspecified site of lip, oral cavity, and pharynx: Secondary | ICD-10-CM | POA: Insufficient documentation

## 2016-01-07 DIAGNOSIS — J358 Other chronic diseases of tonsils and adenoids: Secondary | ICD-10-CM | POA: Diagnosis not present

## 2016-01-07 DIAGNOSIS — E119 Type 2 diabetes mellitus without complications: Secondary | ICD-10-CM | POA: Diagnosis not present

## 2016-01-07 HISTORY — PX: TONSILLECTOMY: SHX5217

## 2016-01-07 HISTORY — DX: Anxiety disorder, unspecified: F41.9

## 2016-01-07 LAB — GLUCOSE, CAPILLARY
GLUCOSE-CAPILLARY: 112 mg/dL — AB (ref 65–99)
GLUCOSE-CAPILLARY: 116 mg/dL — AB (ref 65–99)

## 2016-01-07 SURGERY — TONSILLECTOMY
Anesthesia: General | Site: Mouth | Laterality: Left

## 2016-01-07 MED ORDER — ONDANSETRON HCL 4 MG/2ML IJ SOLN
INTRAMUSCULAR | Status: AC
  Filled 2016-01-07: qty 2

## 2016-01-07 MED ORDER — SCOPOLAMINE 1 MG/3DAYS TD PT72: 1.0000 | MEDICATED_PATCH | Freq: Once | TRANSDERMAL | Status: DC

## 2016-01-07 MED ORDER — SUCCINYLCHOLINE CHLORIDE 20 MG/ML IJ SOLN
INTRAMUSCULAR | Status: DC | PRN
  Administered 2016-01-07: 120 mg via INTRAVENOUS

## 2016-01-07 MED ORDER — OXYCODONE HCL 5 MG/5ML PO SOLN
ORAL | Status: AC
  Filled 2016-01-07: qty 5

## 2016-01-07 MED ORDER — BACITRACIN ZINC 500 UNIT/GM EX OINT
TOPICAL_OINTMENT | CUTANEOUS | Status: AC
  Filled 2016-01-07: qty 1.8

## 2016-01-07 MED ORDER — ONDANSETRON HCL 4 MG/2ML IJ SOLN
INTRAMUSCULAR | Status: DC | PRN
  Administered 2016-01-07: 4 mg via INTRAVENOUS

## 2016-01-07 MED ORDER — DEXAMETHASONE SODIUM PHOSPHATE 4 MG/ML IJ SOLN
INTRAMUSCULAR | Status: DC | PRN
  Administered 2016-01-07: 4 mg via INTRAVENOUS

## 2016-01-07 MED ORDER — MIDAZOLAM HCL 2 MG/2ML IJ SOLN
1.0000 mg | INTRAMUSCULAR | Status: DC | PRN
  Administered 2016-01-07: 2 mg via INTRAVENOUS

## 2016-01-07 MED ORDER — BACITRACIN 500 UNIT/GM EX OINT
TOPICAL_OINTMENT | CUTANEOUS | Status: DC | PRN
  Administered 2016-01-07: 1 via TOPICAL

## 2016-01-07 MED ORDER — OXYMETAZOLINE HCL 0.05 % NA SOLN
NASAL | Status: DC | PRN
  Administered 2016-01-07: 1

## 2016-01-07 MED ORDER — SUCCINYLCHOLINE CHLORIDE 20 MG/ML IJ SOLN
INTRAMUSCULAR | Status: AC
  Filled 2016-01-07: qty 1

## 2016-01-07 MED ORDER — LIDOCAINE-EPINEPHRINE 1 %-1:100000 IJ SOLN
INTRAMUSCULAR | Status: AC
  Filled 2016-01-07: qty 1

## 2016-01-07 MED ORDER — GLYCOPYRROLATE 0.2 MG/ML IJ SOLN
0.2000 mg | Freq: Once | INTRAMUSCULAR | Status: AC | PRN
  Administered 2016-01-07: 0.2 mg via INTRAVENOUS

## 2016-01-07 MED ORDER — FENTANYL CITRATE (PF) 100 MCG/2ML IJ SOLN
INTRAMUSCULAR | Status: AC
  Filled 2016-01-07: qty 2

## 2016-01-07 MED ORDER — FENTANYL CITRATE (PF) 100 MCG/2ML IJ SOLN
25.0000 ug | INTRAMUSCULAR | Status: DC | PRN
  Administered 2016-01-07: 25 ug via INTRAVENOUS

## 2016-01-07 MED ORDER — OXYCODONE HCL 5 MG/5ML PO SOLN
5.0000 mg | Freq: Once | ORAL | Status: AC | PRN
  Administered 2016-01-07: 5 mg via ORAL

## 2016-01-07 MED ORDER — LIDOCAINE HCL (CARDIAC) 20 MG/ML IV SOLN
INTRAVENOUS | Status: DC | PRN
  Administered 2016-01-07: 50 mg via INTRAVENOUS

## 2016-01-07 MED ORDER — LACTATED RINGERS IV SOLN
INTRAVENOUS | Status: DC
  Administered 2016-01-07: 08:00:00 via INTRAVENOUS

## 2016-01-07 MED ORDER — ONDANSETRON HCL 4 MG/2ML IJ SOLN: 4.0000 mg | Freq: Four times a day (QID) | INTRAMUSCULAR | Status: DC | PRN

## 2016-01-07 MED ORDER — PROPOFOL 10 MG/ML IV BOLUS
INTRAVENOUS | Status: DC | PRN
  Administered 2016-01-07: 150 mg via INTRAVENOUS

## 2016-01-07 MED ORDER — GLYCOPYRROLATE 0.2 MG/ML IJ SOLN
INTRAMUSCULAR | Status: AC
  Filled 2016-01-07: qty 1

## 2016-01-07 MED ORDER — MUPIROCIN 2 % EX OINT
TOPICAL_OINTMENT | CUTANEOUS | Status: AC
  Filled 2016-01-07: qty 22

## 2016-01-07 MED ORDER — FENTANYL CITRATE (PF) 100 MCG/2ML IJ SOLN
50.0000 ug | INTRAMUSCULAR | Status: DC | PRN
  Administered 2016-01-07: 50 ug via INTRAVENOUS

## 2016-01-07 MED ORDER — LIDOCAINE HCL (CARDIAC) 20 MG/ML IV SOLN
INTRAVENOUS | Status: AC
  Filled 2016-01-07: qty 5

## 2016-01-07 MED ORDER — OXYCODONE HCL 5 MG PO TABS: 5.0000 mg | ORAL_TABLET | Freq: Once | ORAL | Status: AC | PRN

## 2016-01-07 MED ORDER — OXYCODONE HCL 5 MG/5ML PO SOLN: 5.0000 mg | ORAL | Status: DC | PRN

## 2016-01-07 MED ORDER — MIDAZOLAM HCL 2 MG/2ML IJ SOLN
INTRAMUSCULAR | Status: AC
  Filled 2016-01-07: qty 2

## 2016-01-07 MED ORDER — SODIUM CHLORIDE 0.9 % IR SOLN
Status: DC | PRN
  Administered 2016-01-07: 1

## 2016-01-07 SURGICAL SUPPLY — 31 items
BANDAGE COBAN STERILE 2 (GAUZE/BANDAGES/DRESSINGS) IMPLANT
CANISTER SUCT 1200ML W/VALVE (MISCELLANEOUS) ×3 IMPLANT
CATH ROBINSON RED A/P 10FR (CATHETERS) IMPLANT
CATH ROBINSON RED A/P 14FR (CATHETERS) IMPLANT
COAGULATOR SUCT 6 FR SWTCH (ELECTROSURGICAL)
COAGULATOR SUCT SWTCH 10FR 6 (ELECTROSURGICAL) IMPLANT
COVER MAYO STAND STRL (DRAPES) ×3 IMPLANT
ELECT REM PT RETURN 9FT ADLT (ELECTROSURGICAL) ×3
ELECT REM PT RETURN 9FT PED (ELECTROSURGICAL)
ELECTRODE REM PT RETRN 9FT PED (ELECTROSURGICAL) IMPLANT
ELECTRODE REM PT RTRN 9FT ADLT (ELECTROSURGICAL) IMPLANT
GLOVE BIO SURGEON STRL SZ7.5 (GLOVE) ×3 IMPLANT
GLOVE SURG SS PI 7.0 STRL IVOR (GLOVE) ×2 IMPLANT
GOWN STRL REUS W/ TWL LRG LVL3 (GOWN DISPOSABLE) ×1 IMPLANT
GOWN STRL REUS W/TWL LRG LVL3 (GOWN DISPOSABLE) ×3
IV NS 500ML (IV SOLUTION) ×3
IV NS 500ML BAXH (IV SOLUTION) ×1 IMPLANT
MARKER SKIN DUAL TIP RULER LAB (MISCELLANEOUS) IMPLANT
NS IRRIG 1000ML POUR BTL (IV SOLUTION) IMPLANT
SHEET MEDIUM DRAPE 40X70 STRL (DRAPES) ×3 IMPLANT
SOLUTION BUTLER CLEAR DIP (MISCELLANEOUS) ×3 IMPLANT
SPONGE GAUZE 4X4 12PLY STER LF (GAUZE/BANDAGES/DRESSINGS) ×3 IMPLANT
SPONGE TONSIL 1 RF SGL (DISPOSABLE) IMPLANT
SPONGE TONSIL 1.25 RF SGL STRG (GAUZE/BANDAGES/DRESSINGS) ×2 IMPLANT
SYR BULB 3OZ (MISCELLANEOUS) IMPLANT
TOWEL OR 17X24 6PK STRL BLUE (TOWEL DISPOSABLE) ×3 IMPLANT
TUBE CONNECTING 20'X1/4 (TUBING) ×1
TUBE CONNECTING 20X1/4 (TUBING) ×2 IMPLANT
TUBE SALEM SUMP 12R W/ARV (TUBING) IMPLANT
TUBE SALEM SUMP 16 FR W/ARV (TUBING) ×2 IMPLANT
WAND COBLATOR 70 EVAC XTRA (SURGICAL WAND) ×2 IMPLANT

## 2016-01-07 NOTE — Anesthesia Procedure Notes (Signed)
Procedure Name: Intubation Date/Time: 01/07/2016 8:43 AM Performed by: Lieutenant Diego Pre-anesthesia Checklist: Patient identified, Emergency Drugs available, Suction available and Patient being monitored Patient Re-evaluated:Patient Re-evaluated prior to inductionOxygen Delivery Method: Circle System Utilized Preoxygenation: Pre-oxygenation with 100% oxygen Intubation Type: IV induction Ventilation: Mask ventilation without difficulty Laryngoscope Size: Miller and 2 Grade View: Grade I Tube type: Oral Tube size: 7.0 mm Number of attempts: 1 Airway Equipment and Method: Stylet and Oral airway Placement Confirmation: ETT inserted through vocal cords under direct vision,  positive ETCO2 and breath sounds checked- equal and bilateral Secured at: 22 cm Tube secured with: Tape Dental Injury: Teeth and Oropharynx as per pre-operative assessment

## 2016-01-07 NOTE — H&P (Signed)
Cc: Possible pharyngeal/tonsillar cancer  HPI: The patient is a 65 year old female who returns today for her follow-up evaluation.  She was last seen 1 month ago.  At that time, the patient was noted to have persistent left neck and ear pain.  The patient has a history of pharyngeal cancer and underwent radiation treatment in 2003.   The patient returns complaining of worsening of her left neck pain.  She recently underwent a PET CT scan.  The CT showed asymmetric trace uptake within the left tonsillar region.  She also had nonspecific increased uptake within the right breast and rectum. She underwent a mammography examination and GI evaluation by Dr. Laural Golden.  They were all noted to be normal.  Exam: The nasal cavities were decongested and anesthetised with a combination of oxymetazoline and 4% lidocaine solution.  The flexible scope was inserted into the right nasal cavity and advanced towards the nasopharynx.  Visualized mucosa over the turbinates and septum were normal.  The nasopharynx was clear.  Oropharyngeal walls were symmetric and mobile without lesion, mass, or edema.  Hypopharynx was also without  lesion or edema.  Larynx was mobile without lesions.  No lesions or asymmetry in the supraglottic larynx.  Arytenoid mucosa was normal.  True vocal folds were pale yellow and without mass or lesion.  Base of tongue was within normal limits. The patient tolerated the procedure well.    Assessment: 1.  Persistent left neck and ear pain.  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  2.  Asymmetric increased uptake is noted on PET scan within the left tonsillar region.   3.  No obvious ulceration is noted on today's laryngoscopy exam.   Plan: 1.  The PET images and laryngoscopy findings are reviewed with the patient.  2.  In light of the above findings, the decision is made for the patient to undergo a left tonsillectomy procedure.  The risks, benefits,  alternatives and details of the procedure are reviewed with the patient.   3.  We will schedule the procedure as soon as possible.

## 2016-01-07 NOTE — Anesthesia Postprocedure Evaluation (Signed)
Anesthesia Post Note  Patient: Adrienne Nelson  Procedure(s) Performed: Procedure(s) (LRB): LEFT TONSILLECTOMY (Left)  Patient location during evaluation: PACU Anesthesia Type: General Level of consciousness: awake and alert and patient cooperative Pain management: pain level controlled Vital Signs Assessment: post-procedure vital signs reviewed and stable Respiratory status: spontaneous breathing and respiratory function stable Cardiovascular status: stable Anesthetic complications: no    Last Vitals:  Filed Vitals:   01/07/16 0930 01/07/16 0945  BP: 151/81 164/84  Pulse: 64 65  Temp:    Resp: 21 16    Last Pain:  Filed Vitals:   01/07/16 0952  PainSc: [place]

## 2016-01-07 NOTE — Op Note (Signed)
DATE OF PROCEDURE:  01/07/2016                              OPERATIVE REPORT  SURGEON:  Leta Baptist, MD  PREOPERATIVE DIAGNOSES: 1. 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  COMPLICATIONS:  None.  ESTIMATED BLOOD LOSS:  Minimal.  INDICATION FOR PROCEDURE:  Adrienne Nelson is a 65 y.o. female with a history of chronic left throat and neck pain. The patient has a history of pharyngeal cancer, and underwent radiation treatment in 2003. She recently underwent a PET CT scan. The CT showed asymmetric uptake within the left tonsillar region. Based on the above findings, the decision was made for patient to undergo the left tonsillectomy procedure to remove the possible recurrent carcinoma.  The risks, benefits, alternatives, and details of the procedure were discussed with the mother.  Questions were invited and answered.  Informed consent was obtained.  DESCRIPTION:  The patient was taken to the operating room and placed supine on the operating table.  General endotracheal tube anesthesia was administered by the anesthesiologist.  The patient was positioned and prepped and draped in a standard fashion for adenotonsillectomy.  A Crowe-Davis mouth gag was inserted into the oral cavity for exposure. A firm left tonsillar mass was noted.  The tonsillar mass was then grasped with a straight Allis clamp and retracted medially.  It was resected free from the underlying pharyngeal constrictor muscles with the Coblator device.   The surgical sites were copiously irrigated.  The mouth gag was removed.  The care of the patient was turned over to the anesthesiologist.  The patient was awakened from anesthesia without difficulty.  The patient was extubated and transferred to the recovery room in good condition.  OPERATIVE FINDINGS: Left  tonsillar mass, concerning for recurrent carcinoma.  SPECIMEN:  Left tonsil.  FOLLOWUP CARE:  The patient will be discharged home once awake and alert.  She will be placed on oxycodone 51ml po q 4 hours for postop pain control.   The patient will follow up in my office in approximately 2 weeks.  Mckinzee Spirito,SUI W 01/07/2016 9:01 AM

## 2016-01-07 NOTE — Anesthesia Preprocedure Evaluation (Signed)
Anesthesia Evaluation  Patient identified by MRN, date of birth, ID band Patient awake    Reviewed: Allergy & Precautions, NPO status , Patient's Chart, lab work & pertinent test results  Airway Mallampati: II   Neck ROM: full    Dental   Pulmonary COPD, Current Smoker,    breath sounds clear to auscultation       Cardiovascular hypertension,  Rhythm:regular Rate:Normal     Neuro/Psych Anxiety Depression    GI/Hepatic Ulcerative colitis   Endo/Other  diabetes, Type 2obese  Renal/GU      Musculoskeletal  (+) Arthritis ,   Abdominal   Peds  Hematology   Anesthesia Other Findings   Reproductive/Obstetrics                             Anesthesia Physical Anesthesia Plan  ASA: III  Anesthesia Plan: General   Post-op Pain Management:    Induction: Intravenous  Airway Management Planned: Oral ETT  Additional Equipment:   Intra-op Plan:   Post-operative Plan: Extubation in OR  Informed Consent: I have reviewed the patients History and Physical, chart, labs and discussed the procedure including the risks, benefits and alternatives for the proposed anesthesia with the patient or authorized representative who has indicated his/her understanding and acceptance.     Plan Discussed with: CRNA, Anesthesiologist and Surgeon  Anesthesia Plan Comments:         Anesthesia Quick Evaluation

## 2016-01-07 NOTE — Transfer of Care (Signed)
Immediate Anesthesia Transfer of Care Note  Patient: Adrienne Nelson  Procedure(s) Performed: Procedure(s): LEFT TONSILLECTOMY (Left)  Patient Location: PACU  Anesthesia Type:General  Level of Consciousness: awake  Airway & Oxygen Therapy: Patient Spontanous Breathing and Patient connected to face mask oxygen  Post-op Assessment: Report given to RN and Post -op Vital signs reviewed and stable  Post vital signs: Reviewed and stable  Last Vitals:  Filed Vitals:   01/07/16 0759  BP: 136/69  Pulse: 59  Temp: 36.6 C  Resp: 20    Complications: No apparent anesthesia complications

## 2016-01-07 NOTE — Discharge Instructions (Addendum)
[facility] Postoperative Instructions for Tonsillectomy  Activity Restrict activity at home for the first two days, resting as much as possible. Light indoor activity is best. You may usually return to school or work within a week but void strenuous activity and sports for two weeks. Sleep with your head elevated on 2-3 pillows for 3-4 days to help decrease swelling. Diet Due to tissue swelling and throat discomfort, you may have little desire to drink for several days. However fluids are very important to prevent dehydration. You will find that non-acidic juices, soups, popsicles, Jell-O, custard, puddings, and any soft or mashed foods taken in small quantities can be swallowed fairly easily. Try to increase your fluid and food intake as the discomfort subsides. It is recommended that a child receive 1-1/2 quarts of fluid in a 24-hour period. Adult require twice this amount.  Discomfort Your sore throat may be relieved by applying an ice collar to your neck and/or by taking Tylenol. You may experience an earache, which is due to referred pain from the throat. Referred ear pain is commonly felt at night when trying to rest.  Bleeding                        Although rare, there is risk of having some bleeding during the first 2 weeks after having a T&A. This usually happens between days 7-10 postoperatively. If you or your child should have any bleeding, try to remain calm. We recommend sitting up quietly in a chair and gently spitting out the blood into a bowl. For adults, gargling gently with ice water may help. If the bleeding does not stop after a short time (5 minutes), is more than 1 teaspoonful, or if you become worried, please call our office at [phone] or go directly to the nearest hospital emergency room. Do not eat or drink anything prior to going to the hospital as you may need to be taken to the operating room in order to control the bleeding. GENERAL  CONSIDERATIONS 1. Brush your teeth regularly. Avoid mouthwashes and gargles for three weeks. You may gargle gently with warm salt-water as necessary or spray with Chloraseptic. You may make salt-water by placing 2 teaspoons of table salt into a quart of fresh water. Warm the salt-water in a microwave to a luke warm temperature.  2. Avoid exposure to colds and upper respiratory infections if possible.  3. If you look into a mirror or into your child's mouth, you will see white-gray patches in the back of the throat. This is normal after having a T&A and is like a scab that forms on the skin after an abrasion. It will disappear once the back of the throat heals completely. However, it may cause a noticeable odor; this too will disappear with time. Again, warm salt-water gargles may be used to help keep the throat clean and promote healing.  4. You may notice a temporary change in voice quality, such as a higher pitched voice or a nasal sound, until healing is complete. This may last for 1-2 weeks and should resolve.  5. Do not take or give you child any medications that we have not prescribed or recommended.  6. Snoring may occur, especially at night, for the first week after a T&A. It is due to swelling of the soft palate and will usually resolve.  Please call our office at [phone] if you have any questions.  Post Anesthesia Home Care Instructions ° °Activity: °Get plenty of rest for the remainder of the day. A responsible adult should stay with you for 24 hours following the procedure.  °For the next 24 hours, DO NOT: °-Drive a car °-Operate machinery °-Drink alcoholic beverages °-Take any medication unless instructed by your physician °-Make any legal decisions or sign important papers. ° °Meals: °Start with liquid foods such as gelatin or soup. Progress to regular foods as tolerated. Avoid greasy, spicy, heavy foods. If nausea and/or vomiting occur, drink only clear liquids until the  nausea and/or vomiting subsides. Call your physician if vomiting continues. ° °Special Instructions/Symptoms: °Your throat may feel dry or sore from the anesthesia or the breathing tube placed in your throat during surgery. If this causes discomfort, gargle with warm salt water. The discomfort should disappear within 24 hours. ° °If you had a scopolamine patch placed behind your ear for the management of post- operative nausea and/or vomiting: ° °1. The medication in the patch is effective for 72 hours, after which it should be removed.  Wrap patch in a tissue and discard in the trash. Wash hands thoroughly with soap and water. °2. You may remove the patch earlier than 72 hours if you experience unpleasant side effects which may include dry mouth, dizziness or visual disturbances. °3. Avoid touching the patch. Wash your hands with soap and water after contact with the patch. °  ° °

## 2016-01-08 ENCOUNTER — Encounter (HOSPITAL_BASED_OUTPATIENT_CLINIC_OR_DEPARTMENT_OTHER): Payer: Self-pay | Admitting: Otolaryngology

## 2016-01-16 ENCOUNTER — Ambulatory Visit: Payer: MEDICARE | Admitting: Family Medicine

## 2016-01-16 ENCOUNTER — Encounter (HOSPITAL_COMMUNITY): Payer: Self-pay | Admitting: Hematology & Oncology

## 2016-01-16 ENCOUNTER — Encounter (HOSPITAL_COMMUNITY): Payer: MEDICARE | Attending: Hematology & Oncology | Admitting: Hematology & Oncology

## 2016-01-16 VITALS — BP 150/91 | HR 83 | Temp 97.3°F | Resp 20 | Ht <= 58 in | Wt 186.0 lb

## 2016-01-16 DIAGNOSIS — C099 Malignant neoplasm of tonsil, unspecified: Secondary | ICD-10-CM | POA: Diagnosis not present

## 2016-01-16 MED ORDER — FENTANYL 25 MCG/HR TD PT72
25.0000 ug | MEDICATED_PATCH | TRANSDERMAL | Status: DC
Start: 2016-01-16 — End: 2016-01-16

## 2016-01-16 MED ORDER — FENTANYL 12 MCG/HR TD PT72: 12.5000 ug | MEDICATED_PATCH | TRANSDERMAL | Status: AC

## 2016-01-16 NOTE — Patient Instructions (Addendum)
[place] at [facility] Discharge Instructions  RECOMMENDATIONS MADE BY THE CONSULTANT AND ANY TEST RESULTS WILL BE SENT TO YOUR REFERRING PHYSICIAN.    Exam and discussion complete by Dr Whitney Muse today We need to get Dr Isidore Moos involved. She is a Pension scheme manager.   You do not have disease outside the area. The breast and the rectum that we had looked at were normal.  The PET scan is negative. We will order a MRI brain. Hildred Alamin is our Art therapist.  She is a good resource going forward.  Her number is [phone].  You can call her with any questions.  She has a Advertising account executive.  She will get back to you. Fentanyl patch, change the patch every 3 days.  You can still take you norco as well if you need it.  We are going to send you to [facility], they want to do Surgery first and we may do the MRI at [facility] or here. But we will let you know about that. Return to see the doctor in 2 weeks, if you are going to the operating room please call and cancel that appt. Please call the clinic if you have any questions or concerns    Thank you for choosing [place] at [facility] to provide your oncology and hematology care.  To afford each patient quality time with our provider, please arrive at least 15 minutes before your scheduled appointment time.   Beginning January 23rd 2017 lab work for the [facility] will be done in the  Main lab at [facility] on 1st floor. If you have a lab appointment with the [facility] please come in thru the  Main Entrance and check in at the main information desk  You need to re-schedule your appointment should you arrive 10 or more minutes late.  We strive to give you quality time with our providers, and arriving late affects you and other patients whose appointments are after yours.  Also, if you no show three or more times for appointments you may be dismissed from the clinic at the providers discretion.      Again, thank you for choosing [facility].  Our hope is that these requests will decrease the amount of time that you wait before being seen by our physicians.       _____________________________________________________________  Should you have questions after your visit to [facility], please contact our office at [phone] between the hours of 8:30 a.m. and 4:30 p.m.  Voicemails left after 4:30 p.m. will not be returned until the following business day.  For prescription refill requests, have your pharmacy contact our office.
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
## 2016-01-17 ENCOUNTER — Other Ambulatory Visit (HOSPITAL_COMMUNITY): Payer: Self-pay | Admitting: Oncology

## 2016-01-17 ENCOUNTER — Ambulatory Visit (INDEPENDENT_AMBULATORY_CARE_PROVIDER_SITE_OTHER): Payer: MEDICARE | Admitting: Otolaryngology

## 2016-01-17 ENCOUNTER — Other Ambulatory Visit (HOSPITAL_COMMUNITY): Payer: Self-pay | Admitting: *Deleted

## 2016-01-17 DIAGNOSIS — R519 Headache, unspecified: Secondary | ICD-10-CM

## 2016-01-17 DIAGNOSIS — R51 Headache: Secondary | ICD-10-CM

## 2016-01-17 DIAGNOSIS — C140 Malignant neoplasm of pharynx, unspecified: Secondary | ICD-10-CM

## 2016-01-17 MED ORDER — MORPHINE SULFATE ER 15 MG PO TBCR: 15.0000 mg | EXTENDED_RELEASE_TABLET | Freq: Three times a day (TID) | ORAL | Status: DC

## 2016-01-18 ENCOUNTER — Ambulatory Visit (HOSPITAL_COMMUNITY)
Admission: RE | Admit: 2016-01-18 | Discharge: 2016-01-18 | Disposition: A | Discharge status: Home / Self Care | Payer: MEDICARE | Source: Ambulatory Visit | Attending: Oncology | Admitting: Oncology

## 2016-01-18 DIAGNOSIS — C099 Malignant neoplasm of tonsil, unspecified: Secondary | ICD-10-CM | POA: Diagnosis not present

## 2016-01-18 DIAGNOSIS — C140 Malignant neoplasm of pharynx, unspecified: Secondary | ICD-10-CM | POA: Insufficient documentation

## 2016-01-18 MED ORDER — GADOBENATE DIMEGLUMINE 529 MG/ML IV SOLN
17.0000 mL | Freq: Once | INTRAVENOUS | Status: AC | PRN
  Administered 2016-01-18: 17 mL via INTRAVENOUS

## 2016-01-21 ENCOUNTER — Other Ambulatory Visit (HOSPITAL_COMMUNITY): Payer: Self-pay | Admitting: *Deleted

## 2016-01-21 ENCOUNTER — Encounter (HOSPITAL_COMMUNITY): Payer: Self-pay | Admitting: *Deleted

## 2016-01-22 ENCOUNTER — Ambulatory Visit: Payer: MEDICARE | Admitting: Family Medicine

## 2016-01-23 ENCOUNTER — Other Ambulatory Visit: Payer: Self-pay | Admitting: Family Medicine

## 2016-01-23 DIAGNOSIS — C099 Malignant neoplasm of tonsil, unspecified: Secondary | ICD-10-CM | POA: Diagnosis not present

## 2016-01-23 DIAGNOSIS — H9190 Unspecified hearing loss, unspecified ear: Secondary | ICD-10-CM | POA: Diagnosis not present

## 2016-02-01 ENCOUNTER — Encounter (HOSPITAL_COMMUNITY): Payer: MEDICARE | Attending: Hematology & Oncology | Admitting: Hematology & Oncology

## 2016-02-01 ENCOUNTER — Encounter (HOSPITAL_COMMUNITY): Payer: Self-pay | Admitting: Hematology & Oncology

## 2016-02-01 VITALS — BP 168/96 | HR 67 | Temp 98.0°F | Resp 20 | Wt 187.0 lb

## 2016-02-01 DIAGNOSIS — C099 Malignant neoplasm of tonsil, unspecified: Secondary | ICD-10-CM | POA: Diagnosis not present

## 2016-02-01 DIAGNOSIS — Z720 Tobacco use: Secondary | ICD-10-CM

## 2016-02-01 DIAGNOSIS — G893 Neoplasm related pain (acute) (chronic): Secondary | ICD-10-CM | POA: Diagnosis not present

## 2016-02-01 NOTE — Progress Notes (Signed)
Adrienne Nelson at [facility] NOTE  Patient Care Team: Adrienne Helper, MD as PCP - General Adrienne Ar, MD (Pain Medicine) Adrienne Cranker, MD as Consulting Physician (Orthopedic Surgery) [facility]. Adrienne Pulling, MD as Referring Physician (Otolaryngology)   CHIEF COMPLAINTS/PURPOSE OF CONSULTATION:  L tonsillectomy with Dr. 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 Laural Nelson   HISTORY OF PRESENTING ILLNESS:  Adrienne Nelson 65 y.o. 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  Adrienne Nelson returns to the [facility] today accompanied by her sister, who is also caring for a small toddler today.  Her surgery is scheduled for March 7th. She confirms that she likes her surgeon, but says she didn't talk to him very long because he was very busy. She says he was a very pleasant person. He worked her in quickly.  She says that her pain still hurts in the same spots. When asked about the ways she treats the pain, she remarks that she stopped taking morphine because it "made her act like the walking dead." She says at this point she takes "10's" for her other pain and that's about it.  Adrienne Nelson says she wishes that her surgery could happen sooner. She asks whether she will be intubated afterward, and her sister says that "the only thing he mentioned would be a drainage tube."  She asks whether the cancer is metastasizing as she waits for the surgery to happen, and was advised that this is highly unlikely, as there is no  evidence of cancer anywhere but the one area. When asked about her first experience with cancer, she confirms again that she just had radiation and chemotherapy.  Regarding her eating and drinking habits, Adrienne Nelson says she can drink fine, but that eating is still a problem at times. She confirms working on eating regardless. She was advised to try boost or ensure as an option for nutritional supplement, and remarks that she is willing to try, but "not enthusiastic" about it. She was advised how important it is to stay nutritionally sound prior to surgery.  CONSULTATION AT [facility]:  Assessment: 1. 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 Decreased hearing 4. Problematic wound healing due to previous radiation therapy  Plan: I will obtain her recent PET/CT top review  Treatment options were discussed including associated side effects. She is not a candidate for full course radiation to tonsillar fossa, given the condition of mucosa seen today. However, treatment options would include re-irradiation therapy (with increase complications) or additional surgical resection with wide margins in left tonsil area with RFFF free flap for reconstruction. She is not a robotic candidate due to prior radiation and the resection of soft palate with margins. Adrienne Nelson is in agreement with my recommendations. She will see anesthesia prior to surgery.   This document serves as a record of services personally performed by Dr. Marijo Nelson. It was created on their behalf by Adrienne Nelson, Medical Scribe, a trained medical scribe. The creation of  this record is the provider's dictation and/or activities during the visit.   Electronically signed by: Adrienne Nelson, Medical Scribe 01/23/2016 9:31 AM   I agree the documentation is accurate and complete.  Electronically signed by: Adrienne Sax, MD 01/24/2016 1:01 PM     MEDICAL HISTORY:  Past Medical History  Diagnosis Date  . Hand pain     bilateral    . Arthritis of knee, right   . Depression   . Hearing loss   . Ulcerative colitis   . Hyperlipidemia   . Type 2 diabetes mellitus without complications ([facility])   . Hypertension   . Hypercholesteremia   . IBS (irritable bowel syndrome)   . Diverticulosis   . Kidney stones   . Throat cancer [facility]) 2003    radiation treatment  . COPD (chronic obstructive pulmonary disease) (Adrienne Nelson)   . Anxiety     SURGICAL HISTORY: Past Surgical History  Procedure Laterality Date  . Cesarean section    . Cesarean section    . Carpal tunnel release    . Cholecystectomy    . Total abdominal hysterectomy    . Left trigger finger surgery  Dec. 2011  . Colonoscopy N/A 04/06/2014    Procedure: COLONOSCOPY;  Surgeon: Adrienne Houston, MD;  Location: [facility];  Service: Endoscopy;  Laterality: N/A;  830  . Joint replacement Right 09/05/2014    knee  . Meniscus repair Left 2016  . Flexible sigmoidoscopy N/A 12/12/2015    Procedure: FLEXIBLE SIGMOIDOSCOPY;  Surgeon: Adrienne Houston, MD;  Location: [facility];  Service: Endoscopy;  Laterality: N/A;  930  . Tonsillectomy Left 01/07/2016    Procedure: LEFT TONSILLECTOMY;  Surgeon: Adrienne Baptist, MD;  Location: [facility];  Service: ENT;  Laterality: Left;    SOCIAL HISTORY: Social History   Social History  . Marital Status: Widowed    Spouse Name: N/A  . Number of Children: 2  . Years of Education: N/A   Occupational History  . disabled     Social History Main Topics  . Smoking status: Current Every Day Smoker -- 1.00 packs/day for 20 years    Types: Cigarettes  . Smokeless tobacco: Not on file  . Alcohol Use: No  . Drug Use: No  . Sexual Activity: No   Other Topics Concern  . Not on file   Social History Narrative   She has one other sibling, a brother with whom she is not close Two children Widowed  since 2004 Used to work outside the home years ago Still smokes about 4 or 5 a day Never drinker  FAMILY HISTORY: Family History  Problem Relation Age of Onset  . Lung cancer Mother 33  . Colon cancer Father 68   indicated that her mother is deceased. She indicated that her father is deceased. She indicated that her sister is alive. She indicated that her brother is alive.   Father died of colon cancer; mother died of lung cancer Mother was 63, father was 58 or 64.  ALLERGIES:  is allergic to penicillins; elavil; gabapentin; ibuprofen; and serax.  MEDICATIONS:  Current Outpatient Prescriptions  Medication Sig Dispense Refill  . amLODipine (NORVASC) 5 MG tablet TAKE ONE TABLET BY MOUTH ONCE DAILY 30 tablet 2  . Calcium Carbonate-Vitamin D (CALCIUM 600+D) 600-400 MG-UNIT per tablet Take 1 tablet by mouth 3 (three) times daily with meals.      . cloNIDine (CATAPRES) 0.3 MG tablet TAKE ONE  TABLET BY MOUTH TWICE DAILY 60 tablet 2  . docusate sodium (COLACE) 100 MG capsule Take 200 mg by mouth at bedtime.    . fentaNYL (DURAGESIC - DOSED MCG/HR) 12 MCG/HR Place 1 patch (12.5 mcg total) onto the skin every 3 (three) days. 5 patch 0  . FLUoxetine (PROZAC) 40 MG capsule TAKE ONE CAPSULE BY MOUTH ONCE DAILY 30 capsule 2  . HYDROcodone-acetaminophen (NORCO) 10-325 MG tablet Take 10-325 tablets by mouth every 6 (six) hours as needed.    . lidocaine (XYLOCAINE) 2 % solution Apply to affected area every 2 hours as needed (dose change) 300 mL 3  . lisinopril (PRINIVIL,ZESTRIL) 40 MG tablet TAKE ONE TABLET BY MOUTH ONCE DAILY 30 tablet 2  . metFORMIN (GLUCOPHAGE) 500 MG tablet TAKE ONE TABLET BY MOUTH ONCE DAILY WITH  BREAKFAST 30 tablet 2  . Multiple Vitamin (MULTIVITAMIN) tablet Take 1 tablet by mouth daily.      . potassium chloride SA (KLOR-CON M20) 20 MEQ tablet Take 1 tablet (20 mEq total) by mouth 2 (two) times daily. 60 tablet 4  . pregabalin (LYRICA) 50 MG capsule Take 1 capsule (50 mg  total) by mouth 3 (three) times daily. 90 capsule 2  . rOPINIRole (REQUIP) 0.25 MG tablet TAKE ONE TABLET BY MOUTH ONCE DAILY AT BEDTIME 30 tablet 3  . simvastatin (ZOCOR) 40 MG tablet TAKE ONE TABLET BY MOUTH AT BEDTIME 30 tablet 2  . traZODone (DESYREL) 100 MG tablet Two tablets at bedtime 180 tablet 1  . venlafaxine XR (EFFEXOR-XR) 75 MG 24 hr capsule TAKE ONE CAPSULE BY MOUTH WITH BREAKFAST DAILY 30 capsule 2  . morphine (MS CONTIN) 15 MG 12 hr tablet Take 1 tablet (15 mg total) by mouth every 8 (eight) hours. (Patient not taking: Reported on 02/01/2016) 90 tablet 0   No current facility-administered medications for this visit.    Review of Systems  Constitutional: Negative.   HENT: Positive for hearing loss.        Pain she describes as in her L sinus region and surrounding.  Eyes: Negative.   Respiratory: Negative.   Cardiovascular: Negative.   Gastrointestinal: Negative.   Genitourinary: Negative.   Musculoskeletal: Negative.   Skin: Negative.   Neurological: Negative.   Endo/Heme/Allergies: Negative.   Psychiatric/Behavioral: The patient has insomnia.        Trouble sleeping due to continuous pain in her face.  All other systems reviewed and are negative.  14 point ROS was done and is otherwise as detailed above or in HPI   PHYSICAL EXAMINATION: ECOG PERFORMANCE STATUS: 1 - Symptomatic but completely ambulatory  Filed Vitals:   02/01/16 0939  BP: 168/96  Pulse: 67  Temp: 98 F (36.7 C)  Resp: 20   Filed Weights   02/01/16 0939  Weight: 187 lb (84.823 kg)     Physical Exam  Constitutional: She is oriented to person, place, and time and well-developed, well-nourished, and in no distress.  Obese.  HENT:  Head: Normocephalic and atraumatic.  Nose: Nose normal.  Mouth/Throat: Oropharynx is clear and moist. No oropharyngeal exudate.  Eyes: Conjunctivae and EOM are normal. Pupils are equal, round, and reactive to light. Right eye exhibits no discharge. Left eye  exhibits no discharge. No scleral icterus.  Neck: Normal range of motion. Neck supple. No tracheal deviation present. No thyromegaly present.  Radiation changes around her entire neck.  Cardiovascular: Normal rate, regular rhythm and normal heart sounds.  Exam reveals no gallop and no friction rub.  No murmur heard. Pulmonary/Chest: Effort normal and breath sounds normal. She has no wheezes. She has no rales.  Abdominal: Soft. Bowel sounds are normal. She exhibits no distension and no mass. There is no tenderness. There is no rebound and no guarding.  Musculoskeletal: Normal range of motion. She exhibits no edema.  Lymphadenopathy:    She has no cervical adenopathy.  Neurological: She is alert and oriented to person, place, and time. She has normal reflexes. No cranial nerve deficit. Gait normal. Coordination normal.  Skin: Skin is warm and dry. No rash noted.  Psychiatric: Mood, memory, affect and judgment normal.  Nursing note and vitals reviewed.   LABORATORY DATA:  I have reviewed the data as listed Lab Results  Component Value Date   WBC 7.4 10/10/2015   HGB 15.1* 10/10/2015   HCT 43.6 10/10/2015   MCV 86.7 10/10/2015   PLT 308 10/10/2015   CMP     Component Value Date/Time   NA 140 01/03/2016 0900   K 4.1 01/03/2016 0900   CL 103 01/03/2016 0900   CO2 29 01/03/2016 0900   GLUCOSE 105* 01/03/2016 0900   BUN 10 01/03/2016 0900   CREATININE 0.57 01/03/2016 0900   CREATININE 0.70 10/10/2015 0940   CALCIUM 9.5 01/03/2016 0900   PROT 6.2 10/10/2015 0940   ALBUMIN 3.8 10/10/2015 0940   AST 17 10/10/2015 0940   ALT 12 10/10/2015 0940   ALKPHOS 40 10/10/2015 0940   BILITOT 0.4 10/10/2015 0940   GFRNONAA >60 01/03/2016 0900   GFRNONAA >89 10/10/2015 0940   GFRAA >60 01/03/2016 0900   GFRAA >89 10/10/2015 0940    RADIOGRAPHIC STUDIES: I have personally reviewed the radiological images as listed and agreed with the findings in the report.  Study Result     CLINICAL  DATA: 65 year old female with tonsillar cancer treated with radiation. Progressive facial pain. Initial encounter.  EXAM: MRI FACE TRIGEMINAL WITHOUT AND WITH CONTRAST  TECHNIQUE: Multiplanar, multiecho pulse sequences of the face and surrounding structures, including thin slice imaging of the course of the Trigeminal Nerves, were obtained both before and after administration of intravenous contrast.  CONTRAST: 32mL MULTIHANCE GADOBENATE DIMEGLUMINE 529 MG/ML IV SOLN  COMPARISON: Neck CT 03/26/2015. Cervical spine MRI 04/27/2008.  FINDINGS: There is asymmetric increased STIR signal along the left lateral pharyngeal wall and involving the left claw so tonsillar sulcus (series 4, image 12 image 12). The abnormal signal extends out into the left parapharyngeal soft tissues a distance of about 2 cm, and into the inferior aspect of the left masticator space. There is corresponding abnormal indistinct regional hyper enhancement (Series 8, image 12).  That signal abnormality extends toward the left mandible near the course of the left inferior alveolar nerve. However, the left inferior alveolar nerve appears to remain normal, and symmetric to that on the right. Mandible bone marrow signal is within normal limits.  The cavernous sinus and Meckel cave appear normal bilaterally. However, there is asymmetric enhancement of the left V2 nerve in the segment situated between the left cavernous sinus and the left infraorbital nerve. See series 9, images 15-17).  The contralateral right V2 is not enhancing. No intraorbital mass or abnormal enhancement identified. Normal bilateral cisternal fifth nerve segments. Normal pterygopalatine ganglia.  The other visualized pharyngeal and laryngeal soft tissues appear symmetric and within normal limits. No cervical lymphadenopathy is identified.  Visualized brain parenchyma remarkable for confluent cerebral white matter T2  hyperintensity along the posterior temporal and occipital lobes. The anterior frontal  lobes appear relatively spared. No abnormal dural thickening or enhancement at the skullbase. Visualized brain parenchyma with no abnormal enhancement.  Small fluid level and mild mucosal thickening in the left maxillary sinus. Trace right mastoid fluid. Other visualized paranasal sinuses and mastoids are clear. No abnormal IAC enhancement.  IMPRESSION: 1. Confluent asymmetric edema and enhancement along the left lateral pharyngeal wall at the level of the oropharynx, and involving the left glossal tonsillar sulcus. Abnormal signal tracks into the surrounding soft tissues including the inferior left masticator space (see series 4, image 12). This is suspicious for tumor recurrence. 2. Asymmetric enhancement of the left V2 trigeminal nerve segment suspicious for perineural tumor. The abnormality in #1 is in proximity to the left V3 nerve, but it appears to remain normal. The remaining bilateral trigeminal imaging is normal.   Electronically Signed  By: Genevie Ann M.D.  On: 01/18/2016 16:18     Study Result     CLINICAL DATA: Subsequent. Treatment strategy for head and neck carcinoma.  EXAM: NUCLEAR MEDICINE PET SKULL BASE TO THIGH  TECHNIQUE: 10.12 mCi F-18 FDG was injected intravenously. Full-ring PET imaging was performed from the skull base to thigh after the radiotracer. CT data was obtained and used for attenuation correction and anatomic localization.  FASTING BLOOD GLUCOSE: Value: 107 mg/dl  COMPARISON: None.  FINDINGS: NECK  There is mild asymmetric increased radiotracer uptake within the left tonsillar region. This has an SUV max equal to 7.4. No hypermetabolic lymph nodes within the soft tissues of the neck.  CHEST  No hypermetabolic mediastinal or hilar nodes. Calcified mediastinal and hilar nodes are identified. No hypermetabolic mediastinal  or hilar lymph nodes present. Aortic atherosclerosis is identified. Calcification within the LAD and left circumflex coronary artery noted. No hypermetabolic axillary or supraclavicular lymph nodes. Within the lateral aspect of the right breast there is a subcutaneous soft tissue attenuating nodule measuring 9 mm. This has an SUV max equal to 2.9, image 74 of series 4.  Calcified granuloma is identified within the lingula. No hypermetabolic pulmonary nodules identified. No suspicious pulmonary nodules on the CT scan.  ABDOMEN/PELVIS  No abnormal hypermetabolic activity within the liver, pancreas, adrenal glands, or spleen. No hypermetabolic lymph nodes in the abdomen or pelvis. Nonobstructing calculus is noted within the inferior pole the right kidney. Right renal cyst is noted. This is incompletely characterized without IV contrast. Aortic atherosclerosis is present. Intense nonspecific uptake is identified in the region of the rectum. No discrete mass identified.  SKELETON  Focus of increased uptake within the L4 vertebra is identified. This has an SUV max equal to 5.6. There is marked spondylosis in this area, without corresponding lytic or sclerotic bone lesions. No additional focal hypermetabolic activity to suggest skeletal metastasis.  IMPRESSION: 1. There is asymmetric increased uptake identified within the left tonsillar region. In this patient who has a history of head neck carcinoma and is presenting with worsening left oral pain correlation with direct visualization, and if indicated, tissue sampling is recommended. 2. Nonspecific increased uptake associated with subcutaneous soft tissue nodule within the right breast. Correlation with breast cancer screening techniques. 3. Intense uptake within the rectum. Nonspecific. Correlation with colon cancer screening technique Sir recommended. 4. Aortic atherosclerosis and multi vessel coronary  artery calcification 5. Previous granulomatous disease.   Electronically Signed  By: Kerby Moors M.D.  On: 12/06/2015 09:12    ASSESSMENT & PLAN:  L tonsillectomy with Dr. Benjamine Nelson on 1/16, c/w squamous cell carcinoma Final pathology with invasive squamous  cell carcinoma that involves deep and peripheral margins History of pharyngeal carcinoma treated with radiation in 2003 PET scan that showed asymetric uptake in the left tonsillar region, intense uptake in rectum, intense uptake in R breast History of ulcerative colitis Right breast and rectal area abnormal on PET Diagnostic mammogram and ultrasound on 12/18/2015 c/w fat necrosis Flex Sig on 12/12/2015 with Dr. Laural Nelson  MRI 1/27/2017Confluent asymmetric edema and enhancement along the left lateral pharyngeal wall at the level of the oropharynx, and involving the left glossal tonsillar sulcus  Asymmetric enhancement of the left V2 trigeminal nerve segment suspicious for perineural tumor.  She will proceed with surgery as planned at [facility].  I advised her that any additional therapy after surgery will be decided by her team at [facility] and if chemotherapy is necessary we can accommodate her locally.   I would like to see her back 2 weeks after her surgery on March 7th.  She does not need pain medicine refills today.  All questions were answered. The patient knows to call the clinic with any problems, questions or concerns.  This document serves as a record of services personally performed by Ancil Linsey, MD. It was created on her behalf by Toni Amend, a trained medical scribe. The creation of this record is based on the scribe's personal observations and the provider's statements to them. This document has been checked and approved by the attending provider.  I have reviewed the above documentation for accuracy and completeness, and I agree with the above.  This note was electronically signed.    Molli Hazard,  MD  02/01/2016 10:12 AM

## 2016-02-01 NOTE — Patient Instructions (Addendum)
..  [place] at [facility] Discharge Instructions  RECOMMENDATIONS MADE BY THE CONSULTANT AND ANY TEST RESULTS WILL BE SENT TO YOUR REFERRING PHYSICIAN.  Treatment will be decided after surgery So far cancer was only seen in the one area We will give you samples of nutritional supplement Try to get a couple of boost down a day You can't go into surgery malnourished Being heavier does not mean you are well nourished  Thank you for choosing [place] at [facility] to provide your oncology and hematology care.  To afford each patient quality time with our provider, please arrive at least 15 minutes before your scheduled appointment time.   Beginning January 23rd 2017 lab work for the [facility] will be done in the  Main lab at [facility] on 1st floor. If you have a lab appointment with the [facility] please come in thru the  Main Entrance and check in at the main information desk  You need to re-schedule your appointment should you arrive 10 or more minutes late.  We strive to give you quality time with our providers, and arriving late affects you and other patients whose appointments are after yours.  Also, if you no show three or more times for appointments you may be dismissed from the clinic at the providers discretion.     Again, thank you for choosing [facility].  Our hope is that these requests will decrease the amount of time that you wait before being seen by our physicians.       _____________________________________________________________  Should you have questions after your visit to [facility], please contact our office at [phone] between the hours of 8:30 a.m. and 4:30 p.m.  Voicemails left after 4:30 p.m. will not be returned until the following business day.  For prescription refill requests, have your pharmacy contact our office.

## 2016-02-04 ENCOUNTER — Other Ambulatory Visit: Payer: Self-pay | Admitting: Family Medicine

## 2016-02-09 ENCOUNTER — Encounter (HOSPITAL_COMMUNITY): Payer: Self-pay | Admitting: Hematology & Oncology

## 2016-02-09 DIAGNOSIS — C099 Malignant neoplasm of tonsil, unspecified: Secondary | ICD-10-CM | POA: Insufficient documentation

## 2016-02-12 ENCOUNTER — Telehealth: Payer: Self-pay | Admitting: Family Medicine

## 2016-02-12 DIAGNOSIS — E1140 Type 2 diabetes mellitus with diabetic neuropathy, unspecified: Secondary | ICD-10-CM

## 2016-02-12 DIAGNOSIS — E785 Hyperlipidemia, unspecified: Secondary | ICD-10-CM

## 2016-02-12 DIAGNOSIS — I10 Essential (primary) hypertension: Secondary | ICD-10-CM

## 2016-02-12 NOTE — Addendum Note (Signed)
Addended by: Eual Fines on: 02/12/2016 02:04 PM   Modules accepted: Orders

## 2016-02-12 NOTE — Telephone Encounter (Signed)
Patient aware and will call back to schedule appt and will get fasting labs done before her visit

## 2016-02-12 NOTE — Telephone Encounter (Signed)
Pls contact pt , let he rknow I am aware  She is being treated for throat cancerand am thinking of her  Needs Fasting lipid, cmp and EHGR, hBA1C with next lab draw and alos an appt in here hopefully in next 2 to 5 weeks , pls sched also

## 2016-02-13 DIAGNOSIS — Z01812 Encounter for preprocedural laboratory examination: Secondary | ICD-10-CM | POA: Diagnosis not present

## 2016-02-13 DIAGNOSIS — I10 Essential (primary) hypertension: Secondary | ICD-10-CM | POA: Diagnosis not present

## 2016-02-13 DIAGNOSIS — C109 Malignant neoplasm of oropharynx, unspecified: Secondary | ICD-10-CM | POA: Diagnosis not present

## 2016-02-15 ENCOUNTER — Ambulatory Visit (HOSPITAL_COMMUNITY): Payer: MEDICARE | Admitting: Hematology & Oncology

## 2016-02-26 DIAGNOSIS — E785 Hyperlipidemia, unspecified: Secondary | ICD-10-CM | POA: Diagnosis present

## 2016-02-26 DIAGNOSIS — G4733 Obstructive sleep apnea (adult) (pediatric): Secondary | ICD-10-CM | POA: Diagnosis present

## 2016-02-26 DIAGNOSIS — I10 Essential (primary) hypertension: Secondary | ICD-10-CM | POA: Diagnosis present

## 2016-02-26 DIAGNOSIS — Z923 Personal history of irradiation: Secondary | ICD-10-CM | POA: Diagnosis not present

## 2016-02-26 DIAGNOSIS — Z9049 Acquired absence of other specified parts of digestive tract: Secondary | ICD-10-CM | POA: Diagnosis not present

## 2016-02-26 DIAGNOSIS — Z4659 Encounter for fitting and adjustment of other gastrointestinal appliance and device: Secondary | ICD-10-CM | POA: Diagnosis not present

## 2016-02-26 DIAGNOSIS — F329 Major depressive disorder, single episode, unspecified: Secondary | ICD-10-CM | POA: Diagnosis present

## 2016-02-26 DIAGNOSIS — M8580 Other specified disorders of bone density and structure, unspecified site: Secondary | ICD-10-CM | POA: Diagnosis present

## 2016-02-26 DIAGNOSIS — E7800 Pure hypercholesterolemia, unspecified: Secondary | ICD-10-CM | POA: Diagnosis present

## 2016-02-26 DIAGNOSIS — M1990 Unspecified osteoarthritis, unspecified site: Secondary | ICD-10-CM | POA: Diagnosis present

## 2016-02-26 DIAGNOSIS — Z720 Tobacco use: Secondary | ICD-10-CM | POA: Diagnosis not present

## 2016-02-26 DIAGNOSIS — C090 Malignant neoplasm of tonsillar fossa: Secondary | ICD-10-CM | POA: Diagnosis not present

## 2016-02-26 DIAGNOSIS — G2581 Restless legs syndrome: Secondary | ICD-10-CM | POA: Diagnosis present

## 2016-02-26 DIAGNOSIS — C01 Malignant neoplasm of base of tongue: Secondary | ICD-10-CM | POA: Diagnosis not present

## 2016-02-26 DIAGNOSIS — G8929 Other chronic pain: Secondary | ICD-10-CM | POA: Diagnosis present

## 2016-02-26 DIAGNOSIS — G250 Essential tremor: Secondary | ICD-10-CM | POA: Diagnosis present

## 2016-02-26 DIAGNOSIS — Z7984 Long term (current) use of oral hypoglycemic drugs: Secondary | ICD-10-CM | POA: Diagnosis not present

## 2016-02-26 DIAGNOSIS — G4700 Insomnia, unspecified: Secondary | ICD-10-CM | POA: Diagnosis present

## 2016-02-26 DIAGNOSIS — H9190 Unspecified hearing loss, unspecified ear: Secondary | ICD-10-CM | POA: Diagnosis present

## 2016-02-26 DIAGNOSIS — J449 Chronic obstructive pulmonary disease, unspecified: Secondary | ICD-10-CM | POA: Diagnosis present

## 2016-02-26 DIAGNOSIS — C109 Malignant neoplasm of oropharynx, unspecified: Secondary | ICD-10-CM | POA: Diagnosis not present

## 2016-02-26 DIAGNOSIS — K589 Irritable bowel syndrome without diarrhea: Secondary | ICD-10-CM | POA: Diagnosis present

## 2016-02-26 DIAGNOSIS — F419 Anxiety disorder, unspecified: Secondary | ICD-10-CM | POA: Diagnosis present

## 2016-02-26 DIAGNOSIS — Z79899 Other long term (current) drug therapy: Secondary | ICD-10-CM | POA: Diagnosis not present

## 2016-02-26 DIAGNOSIS — Z90710 Acquired absence of both cervix and uterus: Secondary | ICD-10-CM | POA: Diagnosis not present

## 2016-02-26 DIAGNOSIS — C099 Malignant neoplasm of tonsil, unspecified: Secondary | ICD-10-CM | POA: Diagnosis present

## 2016-02-26 DIAGNOSIS — E119 Type 2 diabetes mellitus without complications: Secondary | ICD-10-CM | POA: Diagnosis present

## 2016-02-26 DIAGNOSIS — C059 Malignant neoplasm of palate, unspecified: Secondary | ICD-10-CM | POA: Diagnosis present

## 2016-03-07 ENCOUNTER — Other Ambulatory Visit: Payer: Self-pay | Admitting: Family Medicine

## 2016-03-12 ENCOUNTER — Ambulatory Visit (HOSPITAL_COMMUNITY): Payer: MEDICARE | Admitting: Hematology & Oncology

## 2016-03-12 DIAGNOSIS — C01 Malignant neoplasm of base of tongue: Secondary | ICD-10-CM | POA: Diagnosis not present

## 2016-03-12 DIAGNOSIS — Z9889 Other specified postprocedural states: Secondary | ICD-10-CM | POA: Diagnosis not present

## 2016-03-12 DIAGNOSIS — R1312 Dysphagia, oropharyngeal phase: Secondary | ICD-10-CM | POA: Diagnosis not present

## 2016-03-12 DIAGNOSIS — Z930 Tracheostomy status: Secondary | ICD-10-CM | POA: Diagnosis not present

## 2016-03-12 DIAGNOSIS — Z931 Gastrostomy status: Secondary | ICD-10-CM | POA: Diagnosis not present

## 2016-03-13 ENCOUNTER — Telehealth: Payer: Self-pay | Admitting: Family Medicine

## 2016-03-13 DIAGNOSIS — E785 Hyperlipidemia, unspecified: Secondary | ICD-10-CM

## 2016-03-13 DIAGNOSIS — E1140 Type 2 diabetes mellitus with diabetic neuropathy, unspecified: Secondary | ICD-10-CM

## 2016-03-13 DIAGNOSIS — I10 Essential (primary) hypertension: Secondary | ICD-10-CM

## 2016-03-13 MED ORDER — LISINOPRIL 40 MG PO TABS: 40.0000 mg | ORAL_TABLET | Freq: Every day | ORAL | Status: DC

## 2016-03-13 NOTE — Telephone Encounter (Signed)
They are having a hard time getting the simvastatin crushed enough to go in the peg tube. Is there something else they can try?

## 2016-03-13 NOTE — Addendum Note (Signed)
Addended by: Eual Fines on: 03/13/2016 03:11 PM   Modules accepted: Orders

## 2016-03-13 NOTE — Telephone Encounter (Signed)
Adrienne Nelson is aware and order mailed to her

## 2016-03-13 NOTE — Telephone Encounter (Signed)
Patient is on a small feeding tube and [name] is having problems getting the simvastatin (ZOCOR) 40 MG tablet crushed enough to get through the feeding tube, please advise, patients Rx lisinopril (PRINIVIL,ZESTRIL) 40 MG tablet got lost while she was in [place] and Omar (sister) is asking if we can call her in another refill to [facility] in [place]

## 2016-03-13 NOTE — Telephone Encounter (Signed)
Hold on replacement, need updated fasting lipid, cmp and EGFR and hBA1C then will address, labs in next 1 to 2weeks pls Has had poor intake dur to surgery etc,

## 2016-03-18 ENCOUNTER — Encounter (HOSPITAL_COMMUNITY): Payer: MEDICARE

## 2016-03-26 DIAGNOSIS — R1312 Dysphagia, oropharyngeal phase: Secondary | ICD-10-CM | POA: Diagnosis not present

## 2016-03-27 DIAGNOSIS — Z79891 Long term (current) use of opiate analgesic: Secondary | ICD-10-CM | POA: Diagnosis not present

## 2016-03-28 DIAGNOSIS — E785 Hyperlipidemia, unspecified: Secondary | ICD-10-CM | POA: Diagnosis not present

## 2016-03-28 DIAGNOSIS — E1140 Type 2 diabetes mellitus with diabetic neuropathy, unspecified: Secondary | ICD-10-CM | POA: Diagnosis not present

## 2016-03-28 LAB — HEMOGLOBIN A1C
HEMOGLOBIN A1C: 5.8 % — AB (ref <5.7)
Mean Plasma Glucose: 120 mg/dL

## 2016-03-29 LAB — COMPLETE METABOLIC PANEL WITH GFR
ALT: 12 U/L (ref 6–29)
AST: 19 U/L (ref 10–35)
Albumin: 3.7 g/dL (ref 3.6–5.1)
Alkaline Phosphatase: 55 U/L (ref 33–130)
BUN: 9 mg/dL (ref 7–25)
CALCIUM: 9.3 mg/dL (ref 8.6–10.4)
CHLORIDE: 102 mmol/L (ref 98–110)
CO2: 30 mmol/L (ref 20–31)
CREATININE: 0.67 mg/dL (ref 0.50–0.99)
GFR, Est African American: >89 mL/min (ref >=60)
GFR, Est Non African American: >89 mL/min (ref >=60)
GLUCOSE: 109 mg/dL — AB (ref 65–99)
Potassium: 4.6 mmol/L (ref 3.5–5.3)
Sodium: 141 mmol/L (ref 135–146)
Total Bilirubin: 0.3 mg/dL (ref 0.2–1.2)
Total Protein: 6.2 g/dL (ref 6.1–8.1)

## 2016-03-29 LAB — LIPID PANEL
CHOLESTEROL: 170 mg/dL (ref 125–200)
HDL: 48 mg/dL (ref >=46)
LDL Cholesterol: 87 mg/dL (ref <130)
Total CHOL/HDL Ratio: 3.5 Ratio (ref <=5.0)
Triglycerides: 175 mg/dL — ABNORMAL HIGH (ref <150)
VLDL: 35 mg/dL — AB (ref <30)

## 2016-04-07 ENCOUNTER — Other Ambulatory Visit: Payer: Self-pay | Admitting: Family Medicine

## 2016-04-09 DIAGNOSIS — R1312 Dysphagia, oropharyngeal phase: Secondary | ICD-10-CM | POA: Diagnosis not present

## 2016-04-09 DIAGNOSIS — Z931 Gastrostomy status: Secondary | ICD-10-CM | POA: Diagnosis not present

## 2016-04-09 DIAGNOSIS — Z09 Encounter for follow-up examination after completed treatment for conditions other than malignant neoplasm: Secondary | ICD-10-CM | POA: Diagnosis not present

## 2016-04-22 ENCOUNTER — Other Ambulatory Visit: Payer: Self-pay

## 2016-04-22 DIAGNOSIS — L299 Pruritus, unspecified: Secondary | ICD-10-CM | POA: Diagnosis not present

## 2016-04-22 DIAGNOSIS — R21 Rash and other nonspecific skin eruption: Secondary | ICD-10-CM | POA: Diagnosis not present

## 2016-04-22 DIAGNOSIS — E119 Type 2 diabetes mellitus without complications: Secondary | ICD-10-CM | POA: Diagnosis not present

## 2016-04-22 DIAGNOSIS — Z7984 Long term (current) use of oral hypoglycemic drugs: Secondary | ICD-10-CM | POA: Diagnosis not present

## 2016-04-22 DIAGNOSIS — Z9049 Acquired absence of other specified parts of digestive tract: Secondary | ICD-10-CM | POA: Diagnosis not present

## 2016-04-22 DIAGNOSIS — M2550 Pain in unspecified joint: Secondary | ICD-10-CM | POA: Diagnosis not present

## 2016-04-22 DIAGNOSIS — M6281 Muscle weakness (generalized): Secondary | ICD-10-CM | POA: Diagnosis not present

## 2016-04-22 DIAGNOSIS — E669 Obesity, unspecified: Secondary | ICD-10-CM | POA: Diagnosis not present

## 2016-04-22 DIAGNOSIS — R61 Generalized hyperhidrosis: Secondary | ICD-10-CM | POA: Diagnosis not present

## 2016-04-22 DIAGNOSIS — R609 Edema, unspecified: Secondary | ICD-10-CM | POA: Diagnosis not present

## 2016-04-22 DIAGNOSIS — R5383 Other fatigue: Secondary | ICD-10-CM | POA: Diagnosis not present

## 2016-04-22 DIAGNOSIS — Z881 Allergy status to other antibiotic agents status: Secondary | ICD-10-CM | POA: Diagnosis not present

## 2016-04-22 DIAGNOSIS — F329 Major depressive disorder, single episode, unspecified: Secondary | ICD-10-CM | POA: Diagnosis not present

## 2016-04-22 DIAGNOSIS — Z6836 Body mass index (BMI) 36.0-36.9, adult: Secondary | ICD-10-CM | POA: Diagnosis not present

## 2016-04-22 DIAGNOSIS — Z79899 Other long term (current) drug therapy: Secondary | ICD-10-CM | POA: Diagnosis not present

## 2016-04-22 DIAGNOSIS — C109 Malignant neoplasm of oropharynx, unspecified: Secondary | ICD-10-CM | POA: Diagnosis not present

## 2016-04-22 DIAGNOSIS — Z90710 Acquired absence of both cervix and uterus: Secondary | ICD-10-CM | POA: Diagnosis not present

## 2016-04-22 DIAGNOSIS — C760 Malignant neoplasm of head, face and neck: Secondary | ICD-10-CM | POA: Diagnosis not present

## 2016-04-22 DIAGNOSIS — J449 Chronic obstructive pulmonary disease, unspecified: Secondary | ICD-10-CM | POA: Diagnosis not present

## 2016-04-22 DIAGNOSIS — G4700 Insomnia, unspecified: Secondary | ICD-10-CM

## 2016-04-22 DIAGNOSIS — R200 Anesthesia of skin: Secondary | ICD-10-CM | POA: Diagnosis not present

## 2016-04-22 DIAGNOSIS — F419 Anxiety disorder, unspecified: Secondary | ICD-10-CM | POA: Diagnosis not present

## 2016-04-22 DIAGNOSIS — R1310 Dysphagia, unspecified: Secondary | ICD-10-CM | POA: Diagnosis not present

## 2016-04-22 DIAGNOSIS — Z79891 Long term (current) use of opiate analgesic: Secondary | ICD-10-CM | POA: Diagnosis not present

## 2016-04-22 DIAGNOSIS — R238 Other skin changes: Secondary | ICD-10-CM | POA: Diagnosis not present

## 2016-04-22 DIAGNOSIS — Z96651 Presence of right artificial knee joint: Secondary | ICD-10-CM | POA: Diagnosis not present

## 2016-04-22 MED ORDER — TRAZODONE HCL 100 MG PO TABS: ORAL_TABLET | ORAL | Status: DC

## 2016-04-22 MED ORDER — AMLODIPINE BESYLATE 5 MG PO TABS: 5.0000 mg | ORAL_TABLET | Freq: Every day | ORAL | Status: DC

## 2016-04-22 MED ORDER — CLONIDINE HCL 0.3 MG PO TABS: 0.3000 mg | ORAL_TABLET | Freq: Two times a day (BID) | ORAL | Status: DC

## 2016-04-22 MED ORDER — FLUOXETINE HCL 40 MG PO CAPS: 40.0000 mg | ORAL_CAPSULE | Freq: Every day | ORAL | Status: DC

## 2016-04-22 MED ORDER — ROPINIROLE HCL 0.25 MG PO TABS: 0.2500 mg | ORAL_TABLET | Freq: Every day | ORAL | Status: DC

## 2016-04-22 MED ORDER — METFORMIN HCL 500 MG PO TABS: ORAL_TABLET | ORAL | Status: DC

## 2016-04-29 DIAGNOSIS — Z85810 Personal history of malignant neoplasm of tongue: Secondary | ICD-10-CM | POA: Diagnosis not present

## 2016-04-29 DIAGNOSIS — E7800 Pure hypercholesterolemia, unspecified: Secondary | ICD-10-CM | POA: Diagnosis not present

## 2016-04-29 DIAGNOSIS — Z85818 Personal history of malignant neoplasm of other sites of lip, oral cavity, and pharynx: Secondary | ICD-10-CM | POA: Diagnosis not present

## 2016-04-29 DIAGNOSIS — C059 Malignant neoplasm of palate, unspecified: Secondary | ICD-10-CM | POA: Diagnosis not present

## 2016-04-29 DIAGNOSIS — Z01811 Encounter for preprocedural respiratory examination: Secondary | ICD-10-CM | POA: Diagnosis not present

## 2016-04-29 DIAGNOSIS — E119 Type 2 diabetes mellitus without complications: Secondary | ICD-10-CM | POA: Diagnosis not present

## 2016-04-29 DIAGNOSIS — I10 Essential (primary) hypertension: Secondary | ICD-10-CM | POA: Diagnosis not present

## 2016-04-29 DIAGNOSIS — Z923 Personal history of irradiation: Secondary | ICD-10-CM | POA: Diagnosis not present

## 2016-04-29 DIAGNOSIS — Z87891 Personal history of nicotine dependence: Secondary | ICD-10-CM | POA: Diagnosis not present

## 2016-04-29 DIAGNOSIS — G8929 Other chronic pain: Secondary | ICD-10-CM | POA: Diagnosis not present

## 2016-04-29 DIAGNOSIS — R0602 Shortness of breath: Secondary | ICD-10-CM | POA: Diagnosis not present

## 2016-04-29 DIAGNOSIS — R5381 Other malaise: Secondary | ICD-10-CM | POA: Diagnosis not present

## 2016-04-29 DIAGNOSIS — Z7984 Long term (current) use of oral hypoglycemic drugs: Secondary | ICD-10-CM | POA: Diagnosis not present

## 2016-04-29 DIAGNOSIS — R1312 Dysphagia, oropharyngeal phase: Secondary | ICD-10-CM | POA: Diagnosis not present

## 2016-04-30 DIAGNOSIS — E119 Type 2 diabetes mellitus without complications: Secondary | ICD-10-CM | POA: Diagnosis not present

## 2016-04-30 DIAGNOSIS — I10 Essential (primary) hypertension: Secondary | ICD-10-CM | POA: Diagnosis not present

## 2016-04-30 DIAGNOSIS — G8929 Other chronic pain: Secondary | ICD-10-CM | POA: Diagnosis not present

## 2016-04-30 DIAGNOSIS — Z431 Encounter for attention to gastrostomy: Secondary | ICD-10-CM | POA: Diagnosis not present

## 2016-04-30 DIAGNOSIS — C760 Malignant neoplasm of head, face and neck: Secondary | ICD-10-CM | POA: Diagnosis not present

## 2016-04-30 DIAGNOSIS — R5381 Other malaise: Secondary | ICD-10-CM | POA: Diagnosis not present

## 2016-04-30 DIAGNOSIS — R1312 Dysphagia, oropharyngeal phase: Secondary | ICD-10-CM | POA: Diagnosis not present

## 2016-04-30 DIAGNOSIS — R1310 Dysphagia, unspecified: Secondary | ICD-10-CM | POA: Diagnosis not present

## 2016-04-30 DIAGNOSIS — E7800 Pure hypercholesterolemia, unspecified: Secondary | ICD-10-CM | POA: Diagnosis not present

## 2016-05-01 ENCOUNTER — Encounter: Payer: MEDICARE | Admitting: Family Medicine

## 2016-05-04 ENCOUNTER — Emergency Department (HOSPITAL_COMMUNITY): Payer: MEDICARE

## 2016-05-04 ENCOUNTER — Emergency Department (HOSPITAL_COMMUNITY)
Admission: EM | Admit: 2016-05-04 | Discharge: 2016-05-04 | Disposition: A | Discharge status: Home / Self Care | Payer: MEDICARE | Attending: Emergency Medicine | Admitting: Emergency Medicine

## 2016-05-04 ENCOUNTER — Encounter (HOSPITAL_COMMUNITY): Payer: Self-pay | Admitting: Emergency Medicine

## 2016-05-04 DIAGNOSIS — Z7984 Long term (current) use of oral hypoglycemic drugs: Secondary | ICD-10-CM | POA: Insufficient documentation

## 2016-05-04 DIAGNOSIS — K5900 Constipation, unspecified: Secondary | ICD-10-CM | POA: Diagnosis not present

## 2016-05-04 DIAGNOSIS — Z85818 Personal history of malignant neoplasm of other sites of lip, oral cavity, and pharynx: Secondary | ICD-10-CM | POA: Diagnosis not present

## 2016-05-04 DIAGNOSIS — E785 Hyperlipidemia, unspecified: Secondary | ICD-10-CM | POA: Diagnosis not present

## 2016-05-04 DIAGNOSIS — R6883 Chills (without fever): Secondary | ICD-10-CM | POA: Diagnosis not present

## 2016-05-04 DIAGNOSIS — J449 Chronic obstructive pulmonary disease, unspecified: Secondary | ICD-10-CM | POA: Insufficient documentation

## 2016-05-04 DIAGNOSIS — R1012 Left upper quadrant pain: Secondary | ICD-10-CM | POA: Diagnosis not present

## 2016-05-04 DIAGNOSIS — Z79899 Other long term (current) drug therapy: Secondary | ICD-10-CM | POA: Diagnosis not present

## 2016-05-04 DIAGNOSIS — R05 Cough: Secondary | ICD-10-CM | POA: Diagnosis not present

## 2016-05-04 DIAGNOSIS — E876 Hypokalemia: Secondary | ICD-10-CM | POA: Diagnosis not present

## 2016-05-04 DIAGNOSIS — J40 Bronchitis, not specified as acute or chronic: Secondary | ICD-10-CM | POA: Insufficient documentation

## 2016-05-04 DIAGNOSIS — R109 Unspecified abdominal pain: Secondary | ICD-10-CM

## 2016-05-04 DIAGNOSIS — F17210 Nicotine dependence, cigarettes, uncomplicated: Secondary | ICD-10-CM | POA: Diagnosis not present

## 2016-05-04 DIAGNOSIS — I10 Essential (primary) hypertension: Secondary | ICD-10-CM | POA: Insufficient documentation

## 2016-05-04 DIAGNOSIS — E119 Type 2 diabetes mellitus without complications: Secondary | ICD-10-CM | POA: Insufficient documentation

## 2016-05-04 DIAGNOSIS — M1711 Unilateral primary osteoarthritis, right knee: Secondary | ICD-10-CM | POA: Insufficient documentation

## 2016-05-04 DIAGNOSIS — M549 Dorsalgia, unspecified: Secondary | ICD-10-CM | POA: Diagnosis present

## 2016-05-04 DIAGNOSIS — F329 Major depressive disorder, single episode, unspecified: Secondary | ICD-10-CM | POA: Diagnosis not present

## 2016-05-04 DIAGNOSIS — K5903 Drug induced constipation: Secondary | ICD-10-CM

## 2016-05-04 HISTORY — DX: Zoster without complications: B02.9

## 2016-05-04 LAB — CBC WITH DIFFERENTIAL/PLATELET
BASOS PCT: 1 %
Basophils Absolute: 0 10*3/uL (ref 0.0–0.1)
EOS ABS: 0.4 10*3/uL (ref 0.0–0.7)
EOS PCT: 6 %
HCT: 31.2 % — ABNORMAL LOW (ref 36.0–46.0)
Hemoglobin: 9.8 g/dL — ABNORMAL LOW (ref 12.0–15.0)
LYMPHS ABS: 1.1 10*3/uL (ref 0.7–4.0)
Lymphocytes Relative: 16 %
MCH: 25.4 pg — AB (ref 26.0–34.0)
MCHC: 31.4 g/dL (ref 30.0–36.0)
MCV: 80.8 fL (ref 78.0–100.0)
Monocytes Absolute: 0.9 10*3/uL (ref 0.1–1.0)
Monocytes Relative: 14 %
NEUTROS PCT: 65 %
Neutro Abs: 4.3 10*3/uL (ref 1.7–7.7)
PLATELETS: 259 10*3/uL (ref 150–400)
RBC: 3.86 MIL/uL — AB (ref 3.87–5.11)
RDW: 15.1 % (ref 11.5–15.5)
WBC: 6.7 10*3/uL (ref 4.0–10.5)

## 2016-05-04 LAB — URINALYSIS, ROUTINE W REFLEX MICROSCOPIC
BILIRUBIN URINE: NEGATIVE
Glucose, UA: NEGATIVE mg/dL
HGB URINE DIPSTICK: NEGATIVE
KETONES UR: NEGATIVE mg/dL
Leukocytes, UA: NEGATIVE
NITRITE: NEGATIVE
PROTEIN: NEGATIVE mg/dL
SPECIFIC GRAVITY, URINE: 1.01 (ref 1.005–1.030)
pH: 8.5 — ABNORMAL HIGH (ref 5.0–8.0)

## 2016-05-04 LAB — COMPREHENSIVE METABOLIC PANEL
ALT: 9 U/L — ABNORMAL LOW (ref 14–54)
AST: 14 U/L — AB (ref 15–41)
Albumin: 3.1 g/dL — ABNORMAL LOW (ref 3.5–5.0)
Alkaline Phosphatase: 55 U/L (ref 38–126)
Anion gap: 4 — ABNORMAL LOW (ref 5–15)
BILIRUBIN TOTAL: 0.3 mg/dL (ref 0.3–1.2)
BUN: 6 mg/dL (ref 6–20)
CO2: 32 mmol/L (ref 22–32)
CREATININE: 0.38 mg/dL — AB (ref 0.44–1.00)
Calcium: 8.7 mg/dL — ABNORMAL LOW (ref 8.9–10.3)
Chloride: 102 mmol/L (ref 101–111)
GFR calc Af Amer: >60 mL/min (ref >60)
Glucose, Bld: 117 mg/dL — ABNORMAL HIGH (ref 65–99)
POTASSIUM: 3.3 mmol/L — AB (ref 3.5–5.1)
Sodium: 138 mmol/L (ref 135–145)
TOTAL PROTEIN: 6.1 g/dL — AB (ref 6.5–8.1)

## 2016-05-04 LAB — LIPASE, BLOOD: Lipase: 13 U/L (ref 11–51)

## 2016-05-04 MED ORDER — POTASSIUM CHLORIDE 20 MEQ PO PACK
40.0000 meq | PACK | Freq: Once | ORAL | Status: AC
  Administered 2016-05-04: 40 meq
  Filled 2016-05-04: qty 2

## 2016-05-04 MED ORDER — METOCLOPRAMIDE HCL 5 MG/ML IJ SOLN
10.0000 mg | Freq: Once | INTRAMUSCULAR | Status: AC
  Administered 2016-05-04: 10 mg via INTRAVENOUS
  Filled 2016-05-04: qty 2

## 2016-05-04 MED ORDER — MORPHINE SULFATE (PF) 4 MG/ML IV SOLN
8.0000 mg | Freq: Once | INTRAVENOUS | Status: AC
  Administered 2016-05-04: 8 mg via INTRAVENOUS
  Filled 2016-05-04: qty 2

## 2016-05-04 NOTE — ED Notes (Signed)
Pt is having left sided back pain. Pt states this started 4 days ago. Pain is non-reproducable. She states her pain is worse with inspiration. NAD noted. Pt denies any cough.

## 2016-05-04 NOTE — ED Notes (Addendum)
Patient c/o left upper/mid back pain x3 days Per patient recently had surgery on May 10th for feeding tube placement. Patient told to come to ER for evaluation after calling [facility]. Patient still eating food with difficulty. Patient reports cough and chills, unsure of fevers. Denies any vomiting, diarrhea, or urinary symptoms. Per patient pain worse with deep breath and cough. Denies increase in pain with palpitation or movement.Patient also reports possibly having shingles on right hand.

## 2016-05-04 NOTE — ED Notes (Signed)
PA at bedside.

## 2016-05-04 NOTE — Discharge Instructions (Signed)
Your lab tests show some slight decrease in your potassium, and a mild-to-moderate anemia, but otherwise within normal limits. Your x-rays show increased stool and gas throughout the colon, but no acute chest or abdomen changes. Please continue your laxative. Please increase fluids through your feeding tube. Please see your physician team at [facility] for additional evaluation and for reevaluation of your feeding tube.

## 2016-05-05 NOTE — ED Provider Notes (Signed)
CSN: [id number]     Arrival date & time 05/04/16  1022 History   First MD Initiated Contact with Patient 05/04/16 1025     Chief Complaint  Patient presents with  . Back Pain     (Consider location/radiation/quality/duration/timing/severity/associated sxs/prior Treatment) HPI Comments: Patient is a 65 year old female who presents to the emergency department with a complaint of back pain on the left.  The patient states that she had surgery on May 10 for placement of a permanent feeding tube. She's required surgery because of throat cancer, and had a jejunostomy feeding tube placed at the [facility]. The patient states that she still does some eating, but it is difficult following the surgery and procedures. On May 11, the patient developed some pain in the left upper and mid back. The patient states that she has frequent bouts with cough, and she was concerned that she may have aspirated some food due to her difficulty swallowing. The patient also states that at times she has some chills. She's not had any recent vomiting, and his been no unusual shortness of breath. The patient does state she has had problems with constipation related to strong pain medications being used. The patient states that she was told to come to the [facility] emergency department, however the patient and family member state that they did not want to try to make the drive to [facility], and there were some financial issues as well. They elected to come to the emergency department this morning.  The patient is also concerned about a rash on her hands that she says maybe shingles.  Patient is a 65 y.o. female presenting with back pain. The history is provided by the patient.  Back Pain   Past Medical History  Diagnosis Date  . Hand pain     bilateral    . Arthritis of knee, right   . Depression   . Hearing loss   . Ulcerative colitis   . Hyperlipidemia   . Type 2  diabetes mellitus without complications ([place])   . Hypertension   . Hypercholesteremia   . IBS (irritable bowel syndrome)   . Diverticulosis   . Kidney stones   . Throat cancer [facility]) 2003    radiation treatment  . COPD (chronic obstructive pulmonary disease) ([place])   . Anxiety   . Shingles    Past Surgical History  Procedure Laterality Date  . Cesarean section    . Cesarean section    . Carpal tunnel release    . Cholecystectomy    . Total abdominal hysterectomy    . Left trigger finger surgery  Dec. 2011  . Colonoscopy N/A 04/06/2014    Procedure: COLONOSCOPY;  Surgeon: Rogene Houston, MD;  Location: [facility];  Service: Endoscopy;  Laterality: N/A;  830  . Joint replacement Right 09/05/2014    knee  . Meniscus repair Left 2016  . Flexible sigmoidoscopy N/A 12/12/2015    Procedure: FLEXIBLE SIGMOIDOSCOPY;  Surgeon: Rogene Houston, MD;  Location: [facility];  Service: Endoscopy;  Laterality: N/A;  930  . Tonsillectomy Left 01/07/2016    Procedure: LEFT TONSILLECTOMY;  Surgeon: Leta Baptist, MD;  Location: [place];  Service: ENT;  Laterality: Left;  . Jejunostomy feeding tube    . Throat surgery     Family History  Problem Relation Age of Onset  . Lung cancer Mother 32  . Colon cancer Father 9   Social History  Substance Use Topics  . Smoking status: Current Every Day Smoker -- 1.00 packs/day for 20 years    Types: Cigarettes  . Smokeless tobacco: Never Used  . Alcohol Use: No   OB History    No data available     Review of Systems  Constitutional: Positive for chills.  HENT: Positive for trouble swallowing.   Respiratory: Positive for cough.   Gastrointestinal: Positive for constipation. Negative for vomiting, diarrhea and abdominal distention.  Musculoskeletal: Positive for back pain and arthralgias.  Psychiatric/Behavioral: The patient is nervous/anxious.   All other systems reviewed and are negative.     Allergies  Penicillins;  Elavil; Gabapentin; Ibuprofen; and Serax  Home Medications   Prior to Admission medications   Medication Sig Start Date End Date Taking? Authorizing Provider  amLODipine (NORVASC) 5 MG tablet Take 1 tablet (5 mg total) by mouth daily. 04/22/16  Yes Fayrene Helper, MD  Calcium Carbonate-Vitamin D (CALCIUM 600+D) 600-400 MG-UNIT per tablet Take 1 tablet by mouth 3 (three) times daily with meals.     Yes Historical Provider, MD  cloNIDine (CATAPRES) 0.3 MG tablet Take 1 tablet (0.3 mg total) by mouth 2 (two) times daily. 04/22/16  Yes Fayrene Helper, MD  docusate sodium (COLACE) 100 MG capsule Take 200 mg by mouth at bedtime.   Yes Historical Provider, MD  FLUoxetine (PROZAC) 40 MG capsule Take 1 capsule (40 mg total) by mouth daily. 04/22/16  Yes Fayrene Helper, MD  lisinopril (PRINIVIL,ZESTRIL) 40 MG tablet Take 1 tablet (40 mg total) by mouth daily. 03/13/16  Yes Fayrene Helper, MD  metFORMIN (GLUCOPHAGE) 500 MG tablet TAKE ONE TABLET BY MOUTH ONCE DAILY WITH  BREAKFAST 04/22/16  Yes Fayrene Helper, MD  Multiple Vitamin (MULTIVITAMIN) tablet Take 1 tablet by mouth daily.     Yes Historical Provider, MD  oxyCODONE (ROXICODONE) 5 MG/5ML solution Take 10 mg by mouth every 4 (four) hours as needed. pain 04/21/16  Yes Historical Provider, MD  rOPINIRole (REQUIP) 0.25 MG tablet Take 1 tablet (0.25 mg total) by mouth at bedtime. 04/22/16  Yes Fayrene Helper, MD  simvastatin (ZOCOR) 40 MG tablet TAKE ONE TABLET BY MOUTH AT BEDTIME 12/06/15  Yes Fayrene Helper, MD  traZODone (DESYREL) 100 MG tablet Two tablets at bedtime Patient taking differently: Take 200 mg by mouth at bedtime.  04/22/16  Yes Fayrene Helper, MD  venlafaxine (EFFEXOR) 37.5 MG tablet Take 37.5 mg by mouth 2 (two) times daily. 04/09/16  Yes Historical Provider, MD  fentaNYL (DURAGESIC - DOSED MCG/HR) 12 MCG/HR Place 1 patch (12.5 mcg total) onto the skin every 3 (three) days. 01/16/16   Patrici Ranks, MD  lidocaine  (XYLOCAINE) 2 % solution Apply to affected area every 2 hours as needed (dose change) 12/19/15   Fayrene Helper, MD  morphine (MS CONTIN) 15 MG 12 hr tablet Take 1 tablet (15 mg total) by mouth every 8 (eight) hours. Patient not taking: Reported on 02/01/2016 01/17/16   Patrici Ranks, MD  potassium chloride SA (KLOR-CON M20) 20 MEQ tablet Take 1 tablet (20 mEq total) by mouth 2 (two) times daily. 11/19/15   Fayrene Helper, MD  pregabalin (LYRICA) 50 MG capsule Take 1 capsule (50 mg total) by mouth 3 (three) times daily. 12/31/15   Fayrene Helper, MD  venlafaxine XR (EFFEXOR-XR) 75 MG 24 hr capsule TAKE ONE CAPSULE BY MOUTH WITH BREAKFAST DAILY Patient not taking: Reported on 05/04/2016 12/25/15  Fayrene Helper, MD   BP 165/91 mmHg  Pulse 70  Temp(Src) 98.5 F (36.9 C) (Oral)  Resp 18  Ht 4\' 10"  (1.473 m)  Wt 77.111 kg  BMI 35.54 kg/m2  SpO2 97% Physical Exam  Constitutional: She is oriented to person, place, and time. She appears well-developed and well-nourished.  Non-toxic appearance.  HENT:  Head: Normocephalic.  Right Ear: Tympanic membrane and external ear normal.  Left Ear: Tympanic membrane and external ear normal.  Eyes: EOM and lids are normal. Pupils are equal, round, and reactive to light.  Neck: Normal range of motion. Neck supple. Carotid bruit is not present.  Cardiovascular: Normal rate, regular rhythm, normal heart sounds, intact distal pulses and normal pulses.   Pulmonary/Chest: Breath sounds normal. No respiratory distress.  Abdominal: Soft. Bowel sounds are normal. There is tenderness. There is no guarding.  The surgical sites at the abdomen are healing nicely. The feeding tube is in place at the left upper quadrant. There is mild to moderate tenderness of the abdomen, but these seem to be related to being close to the surgical sites.  Musculoskeletal: Normal range of motion.  Lymphadenopathy:       Head (right side): No submandibular adenopathy  present.       Head (left side): No submandibular adenopathy present.    She has no cervical adenopathy.  Neurological: She is alert and oriented to person, place, and time. She has normal strength. No cranial nerve deficit or sensory deficit.  Skin: Skin is warm and dry.  Psychiatric: She has a normal mood and affect. Her speech is normal.  Nursing note and vitals reviewed.   ED Course  Procedures (including critical care time) Labs Review Labs Reviewed  URINALYSIS, ROUTINE W REFLEX MICROSCOPIC (NOT AT [facility]) - Abnormal; Notable for the following:    pH 8.5 (*)    All other components within normal limits  CBC WITH DIFFERENTIAL/PLATELET - Abnormal; Notable for the following:    RBC 3.86 (*)    Hemoglobin 9.8 (*)    HCT 31.2 (*)    MCH 25.4 (*)    All other components within normal limits  COMPREHENSIVE METABOLIC PANEL - Abnormal; Notable for the following:    Potassium 3.3 (*)    Glucose, Bld 117 (*)    Creatinine, Ser 0.38 (*)    Calcium 8.7 (*)    Total Protein 6.1 (*)    Albumin 3.1 (*)    AST 14 (*)    ALT 9 (*)    Anion gap 4 (*)    All other components within normal limits  LIPASE, BLOOD    Imaging Review Dg Abd Acute W/chest  05/04/2016  CLINICAL DATA:  65 year old female with history of left upper to mid back pain for the past 3 days. Cough and chills. EXAM: DG ABDOMEN ACUTE W/ 1V CHEST COMPARISON:  Chest x-ray 09/06/2014. FINDINGS: Lung volumes are normal. No consolidative airspace disease. No pleural effusions. Mild diffuse peribronchial cuffing. No evidence of pulmonary edema. Mild cardiomegaly. Upper mediastinal contours are within normal limits. Atherosclerosis in the thoracic aorta. Small dense nodular opacity projecting over the lower right lateral hemithorax, similar to prior studies, maintaining in associations with the anterior aspect of the right sixth rib is most compatible with a bone island. Surgical clips project over the right upper quadrant of the  abdomen, compatible with prior cholecystectomy. Gas and stool are seen scattered throughout the colon extending to the level of the distal rectum. No pathologic  distension of small bowel is noted. Several nondilated loops of gas-filled small bowel are noted in the right upper quadrant. No gross evidence of pneumoperitoneum. IMPRESSION: 1. Mild diffuse peribronchial cuffing, suggesting an acute bronchitis. 2. Nonobstructive bowel gas pattern. 3. No pneumoperitoneum. 4. Status post cholecystectomy. 5. Mild cardiomegaly. 6. Atherosclerosis. Electronically Signed   By: Vinnie Langton M.D.   On: 05/04/2016 12:05   I have personally reviewed and evaluated these images and lab results as part of my medical decision-making.   EKG Interpretation   Date/Time:  Sunday May 04 2016 11:09:18 EDT Ventricular Rate:  68 PR Interval:  169 QRS Duration: 152 QT Interval:  457 QTC Calculation: 486 R Axis:   68 Text Interpretation:  Sinus rhythm Right bundle branch block Confirmed by  COOK  MD, BRIAN ([id number]) on 05/04/2016 11:24:26 AM     MDM The complete blood count shows the hemoglobin to be low at 99.8, the hematocrit to be low at 31.2. The white blood cell count was within normal limits 6700, and the platelet count is well within normal limits 259,000. The lipase is normal at 13, doubt pancreas related issue. The competence of metabolic panel shows the potassium to be slightly low at 3.3, oral potassium was given. The creatinine is slightly low at 0.38, the anion gap is low at 4. The remainder of the competence of metabolic panel is essentially within normal limits. Urinalysis is negative for urinary tract infection, or kidney stone. Acute abdomen films reveals diffuse peribronchial cuffing suggesting acute bronchitis, there is a nonobstructive bowel gas pattern present there is no pneumoperitoneum noted. There is also noted a significant stool burden throughout the colon.  The patient was treated with  intravenous morphine and Reglan in the emergency department with significant improvement in the discomfort.  As the patient with the findings. She is significantly improved as far as her pain is concerned, and she feels reassured on because of the workup. She states that she will get in touch with her doctors at [facility] for recheck and for evaluation. The patient will continue her current medications. The patient is a stool softener, current. We discussed the need for increasing fluids, as well as perhaps a liquid medications that might be high in fiber she can use through her feeding tube. Also asked the patient to have her bronchitis to be reevaluated by her physicians at [facility], or primary physician. The patient is in agreement with this discharge plan. Final diagnoses:  Left flank pain  Hypokalemia  Constipation due to pain medication      Lily Kocher, PA-C 05/05/16 [place], MD 05/07/16 1154

## 2016-05-15 ENCOUNTER — Other Ambulatory Visit: Payer: Self-pay | Admitting: Family Medicine

## 2016-05-16 ENCOUNTER — Other Ambulatory Visit: Payer: Self-pay

## 2016-05-16 MED ORDER — CLONIDINE HCL 0.3 MG PO TABS: 0.3000 mg | ORAL_TABLET | Freq: Two times a day (BID) | ORAL | Status: DC

## 2016-06-05 ENCOUNTER — Ambulatory Visit (INDEPENDENT_AMBULATORY_CARE_PROVIDER_SITE_OTHER): Payer: MEDICARE | Admitting: Family Medicine

## 2016-06-05 ENCOUNTER — Ambulatory Visit (HOSPITAL_COMMUNITY)
Admission: RE | Admit: 2016-06-05 | Discharge: 2016-06-05 | Disposition: A | Discharge status: Home / Self Care | Payer: MEDICARE | Source: Ambulatory Visit | Attending: Family Medicine | Admitting: Family Medicine

## 2016-06-05 ENCOUNTER — Encounter: Payer: Self-pay | Admitting: Family Medicine

## 2016-06-05 VITALS — BP 138/88 | HR 74 | Resp 16 | Ht <= 58 in | Wt 173.0 lb

## 2016-06-05 DIAGNOSIS — D509 Iron deficiency anemia, unspecified: Secondary | ICD-10-CM

## 2016-06-05 DIAGNOSIS — M79605 Pain in left leg: Principal | ICD-10-CM

## 2016-06-05 DIAGNOSIS — C099 Malignant neoplasm of tonsil, unspecified: Secondary | ICD-10-CM

## 2016-06-05 DIAGNOSIS — M7989 Other specified soft tissue disorders: Secondary | ICD-10-CM

## 2016-06-05 DIAGNOSIS — M545 Low back pain: Secondary | ICD-10-CM | POA: Diagnosis not present

## 2016-06-05 DIAGNOSIS — Z23 Encounter for immunization: Secondary | ICD-10-CM

## 2016-06-05 DIAGNOSIS — I10 Essential (primary) hypertension: Secondary | ICD-10-CM

## 2016-06-05 DIAGNOSIS — F32A Depression, unspecified: Secondary | ICD-10-CM

## 2016-06-05 DIAGNOSIS — G2581 Restless legs syndrome: Secondary | ICD-10-CM

## 2016-06-05 DIAGNOSIS — E0840 Diabetes mellitus due to underlying condition with diabetic neuropathy, unspecified: Secondary | ICD-10-CM

## 2016-06-05 DIAGNOSIS — F329 Major depressive disorder, single episode, unspecified: Secondary | ICD-10-CM

## 2016-06-05 DIAGNOSIS — M47896 Other spondylosis, lumbar region: Secondary | ICD-10-CM

## 2016-06-05 DIAGNOSIS — G4700 Insomnia, unspecified: Secondary | ICD-10-CM

## 2016-06-05 LAB — CBC
HCT: 33.8 % — ABNORMAL LOW (ref 35.0–45.0)
Hemoglobin: 10.4 g/dL — ABNORMAL LOW (ref 11.7–15.5)
MCH: 23.3 pg — AB (ref 27.0–33.0)
MCHC: 30.8 g/dL — AB (ref 32.0–36.0)
MCV: 75.8 fL — ABNORMAL LOW (ref 80.0–100.0)
MPV: 9.7 fL (ref 7.5–12.5)
PLATELETS: 313 10*3/uL (ref 140–400)
RBC: 4.46 MIL/uL (ref 3.80–5.10)
RDW: 16 % — ABNORMAL HIGH (ref 11.0–15.0)
WBC: 5.2 10*3/uL (ref 3.8–10.8)

## 2016-06-05 MED ORDER — METHYLPREDNISOLONE ACETATE 80 MG/ML IJ SUSP
80.0000 mg | Freq: Once | INTRAMUSCULAR | Status: AC
  Administered 2016-06-05: 80 mg via INTRAMUSCULAR

## 2016-06-05 MED ORDER — PREDNISONE 5 MG (21) PO TBPK: 5.0000 mg | ORAL_TABLET | ORAL | Status: DC

## 2016-06-05 NOTE — Patient Instructions (Addendum)
Annual wellness in 3.5 month, call if you need me before  Pneumonia 23 today  Depop medrol in office and prednisone sent for back pain  You will be called in the afternoon re appt for right leg ultrasound since swollen and tender  Some time this pm  CBC, anemia panel, chem 7 and eGFr

## 2016-06-05 NOTE — Progress Notes (Signed)
   Subjective:    Patient ID: Adrienne Nelson, female    DOB: [birth year], 65 y.o.   MRN: [id number]  HPI    Review of Systems     Objective:   Physical Exam        Assessment & Plan:

## 2016-06-05 NOTE — Assessment & Plan Note (Signed)
5 day h/o increased back pain, depo medrol in offfice and pred dose pack

## 2016-06-06 ENCOUNTER — Other Ambulatory Visit: Payer: Self-pay

## 2016-06-06 DIAGNOSIS — G4700 Insomnia, unspecified: Secondary | ICD-10-CM

## 2016-06-06 LAB — IRON AND TIBC
%SAT: 5 % — ABNORMAL LOW (ref 11–50)
Iron: 21 ug/dL — ABNORMAL LOW (ref 45–160)
TIBC: 432 ug/dL (ref 250–450)
UIBC: 411 ug/dL — ABNORMAL HIGH (ref 125–400)

## 2016-06-06 LAB — BASIC METABOLIC PANEL
BUN: 10 mg/dL (ref 7–25)
CALCIUM: 9.2 mg/dL (ref 8.6–10.4)
CO2: 29 mmol/L (ref 20–31)
CREATININE: 0.54 mg/dL (ref 0.50–0.99)
Chloride: 105 mmol/L (ref 98–110)
GLUCOSE: 96 mg/dL (ref 65–99)
Potassium: 3.4 mmol/L — ABNORMAL LOW (ref 3.5–5.3)
Sodium: 141 mmol/L (ref 135–146)

## 2016-06-06 LAB — FERRITIN: Ferritin: 12 ng/mL — ABNORMAL LOW (ref 20–288)

## 2016-06-06 LAB — VITAMIN B12: VITAMIN B 12: 230 pg/mL (ref 200–1100)

## 2016-06-06 LAB — FOLATE: FOLATE: 13.6 ng/mL (ref >5.4)

## 2016-06-06 MED ORDER — TRAZODONE HCL 100 MG PO TABS: ORAL_TABLET | ORAL | Status: DC

## 2016-06-06 MED ORDER — METFORMIN HCL 500 MG PO TABS: ORAL_TABLET | ORAL | Status: DC

## 2016-06-06 MED ORDER — ROPINIROLE HCL 0.25 MG PO TABS: 0.2500 mg | ORAL_TABLET | Freq: Every day | ORAL | Status: DC

## 2016-06-06 MED ORDER — FLUOXETINE HCL 40 MG PO CAPS: 40.0000 mg | ORAL_CAPSULE | Freq: Every day | ORAL | Status: DC

## 2016-06-06 MED ORDER — AMLODIPINE BESYLATE 5 MG PO TABS: 5.0000 mg | ORAL_TABLET | Freq: Every day | ORAL | Status: DC

## 2016-06-09 DIAGNOSIS — C090 Malignant neoplasm of tonsillar fossa: Secondary | ICD-10-CM | POA: Diagnosis not present

## 2016-06-09 DIAGNOSIS — Z9889 Other specified postprocedural states: Secondary | ICD-10-CM | POA: Diagnosis not present

## 2016-06-09 DIAGNOSIS — C109 Malignant neoplasm of oropharynx, unspecified: Secondary | ICD-10-CM | POA: Diagnosis not present

## 2016-06-09 DIAGNOSIS — Z923 Personal history of irradiation: Secondary | ICD-10-CM | POA: Diagnosis not present

## 2016-06-09 DIAGNOSIS — C01 Malignant neoplasm of base of tongue: Secondary | ICD-10-CM | POA: Diagnosis not present

## 2016-06-09 DIAGNOSIS — Z9089 Acquired absence of other organs: Secondary | ICD-10-CM | POA: Diagnosis not present

## 2016-06-11 ENCOUNTER — Telehealth: Payer: Self-pay | Admitting: *Deleted

## 2016-06-11 NOTE — Telephone Encounter (Signed)
Patient called stating she is having trouble with her right leg, patient said sores will come up and go away, patient would like to know if there is something she can take for it.  Please advise [phone].

## 2016-06-12 NOTE — Telephone Encounter (Signed)
States she occasionally has these small sores that come up on her right leg. Don't itch or drain. Currently has one only about a 1/4 inch in size. Advised to use neosporin on it and to call back if it didn't go away or more areas came up

## 2016-06-15 ENCOUNTER — Telehealth: Payer: Self-pay | Admitting: Family Medicine

## 2016-06-15 ENCOUNTER — Encounter: Payer: Self-pay | Admitting: Family Medicine

## 2016-06-15 DIAGNOSIS — G2581 Restless legs syndrome: Secondary | ICD-10-CM | POA: Insufficient documentation

## 2016-06-15 NOTE — Assessment & Plan Note (Signed)
Inadequately controlled increase requip dose

## 2016-06-15 NOTE — Assessment & Plan Note (Signed)
Adrienne Nelson is reminded of the importance of commitment to daily physical activity for 30 minutes or more, as able and the need to limit carbohydrate intake to 30 to 60 grams per meal to help with blood sugar control.   The need to take medication as prescribed, test blood sugar as directed, and to call between visits if there is a concern that blood sugar is uncontrolled is also discussed.   Adrienne Nelson is reminded of the importance of daily foot exam, annual eye examination, and good blood sugar, blood pressure and cholesterol control.  Diabetic Labs Latest Ref Rng 06/05/2016 05/04/2016 03/28/2016 01/03/2016 10/10/2015  HbA1c <5.7 % - - 5.8(H) - 6.1(H)  Microalbumin <2.0 mg/dL - - - - -  Micro/Creat Ratio 0.0 - 30.0 mg/g - - - - -  Chol 125 - 200 mg/dL - - 170 - -  HDL >=46 mg/dL - - 48 - -  Calc LDL <130 mg/dL - - 87 - -  Triglycerides <150 mg/dL - - 175(H) - -  Creatinine 0.50 - 0.99 mg/dL 0.54 0.38(L) 0.67 0.57 0.70   BP/Weight 06/05/2016 05/04/2016 02/01/2016 01/18/2016 01/16/2016 01/07/2016 Q000111Q  Systolic BP 0000000 123XX123 [id number] - Q000111Q Q000111Q 0000000  Diastolic BP 88 91 96 - 91 97 86  Wt. (Lbs) 173 170 187 186 186 190.6 -  BMI 36.17 35.54 39.09 38.88 38.88 38.48 -   Foot/eye exam completion dates 06/13/2015 08/20/2010  Foot exam Order - yes  Foot Form Completion Done -

## 2016-06-15 NOTE — Assessment & Plan Note (Signed)
1 weekl h/o painful swelling of right calf, urgent US to evaluate for DVT

## 2016-06-15 NOTE — Assessment & Plan Note (Signed)
inadequate pain control, managed through pain clinic, will need to continue to follow with pain specilaist

## 2016-06-15 NOTE — Progress Notes (Signed)
Adrienne Nelson     MRN: [id number]      DOB: 1951/11/10   HPI Adrienne Nelson is here for follow up and re-evaluation of chronic medical conditions, medication management and review of any available recent lab and radiology data.  Preventive health is updated, specifically  Cancer screening and Immunization.   Since last seen in the office she has had surgery for right tonsillar cancer, and has an appt next week for PET scan to evaluate response to treatment C/o significant back and ;lower extremity pain , also swelling of right calf for 2 weeks C/o inadequate response to treatment for  Restless leg  ROS Denies recent fever or chills. Denies sinus pressure, nasal congestion, ear pain or sore throat. Denies chest congestion, productive cough or wheezing. Denies chest pains, palpitations and leg swelling Denies abdominal pain, nausea, vomiting,diarrhea or constipation.   Denies dysuria, frequency, hesitancy or incontinence. Denies joint pain, swelling and limitation in mobility. Denies headaches, seizures, numbness, or tingling. Denies depression, anxiety or insomnia. Denies skin break down or rash.   PE  BP 138/88 mmHg  Pulse 74  Resp 16  Ht 4\' 10"  (1.473 m)  Wt 173 lb (78.472 kg)  BMI 36.17 kg/m2  SpO2 99%  Patient alert and oriented and in no cardiopulmonary distress.  HEENT: No facial asymmetry, EOMI,   oropharynx pink and moist.  Neck supple no JVD, no mass.  Chest: Clear to auscultation bilaterally.  CVS: S1, S2 no murmurs, no S3.Regular rate.  ABD: Soft non tender.   Ext: right calf tender and swollen , positive homanns  MS: Decreased  ROM spine, shoulders, hips and knees.  Skin: Intact, no ulcerations or rash noted.  Psych: Good eye contact, normal affect. Memory mildly impaired not anxious or depressed appearing.  CNS: CN 2-12 intact, power,  normal throughout.no focal deficits noted.   Assessment & Plan  Lumbar pain with radiation down left leg 5 day h/o  increased back pain, depo medrol in offfice and pred dose pack  Right leg swelling 1 weekl h/o painful swelling of right calf, urgent US to evaluate for DVT  Essential hypertension Controlled, no change in medication   Diabetes mellitus with neuropathy Adrienne Nelson is reminded of the importance of commitment to daily physical activity for 30 minutes or more, as able and the need to limit carbohydrate intake to 30 to 60 grams per meal to help with blood sugar control.   The need to take medication as prescribed, test blood sugar as directed, and to call between visits if there is a concern that blood sugar is uncontrolled is also discussed.   Adrienne Nelson is reminded of the importance of daily foot exam, annual eye examination, and good blood sugar, blood pressure and cholesterol control.  Diabetic Labs Latest Ref Rng 06/05/2016 05/04/2016 03/28/2016 01/03/2016 10/10/2015  HbA1c <5.7 % - - 5.8(H) - 6.1(H)  Microalbumin <2.0 mg/dL - - - - -  Micro/Creat Ratio 0.0 - 30.0 mg/g - - - - -  Chol 125 - 200 mg/dL - - 170 - -  HDL >=46 mg/dL - - 48 - -  Calc LDL <130 mg/dL - - 87 - -  Triglycerides <150 mg/dL - - 175(H) - -  Creatinine 0.50 - 0.99 mg/dL 0.54 0.38(L) 0.67 0.57 0.70   BP/Weight 06/05/2016 05/04/2016 02/01/2016 01/18/2016 01/16/2016 01/07/2016 Q000111Q  Systolic BP 0000000 123XX123 [id number] - Q000111Q Q000111Q 0000000  Diastolic BP 88 91 96 - 91 97 86  Wt. (Lbs)  173 170 187 186 186 190.6 -  BMI 36.17 35.54 39.09 38.88 38.88 38.48 -   Foot/eye exam completion dates 06/13/2015 08/20/2010  Foot exam Order - yes  Foot Form Completion Done -         Lumbar spondylosis inadequate pain control, managed through pain clinic, will need to continue to follow with pain specilaist  Insomnia Sleep hygiene reviewed and written information offered also. Prescription sent for  medication needed.   Depression Controlled, no change in medication   Restless legs syndrome Inadequately controlled increase requip  dose

## 2016-06-15 NOTE — Assessment & Plan Note (Signed)
Controlled, no change in medication  

## 2016-06-15 NOTE — Assessment & Plan Note (Signed)
Sleep hygiene reviewed and written information offered also. Prescription sent for  medication needed.  

## 2016-06-15 NOTE — Telephone Encounter (Signed)
pls call pt, at last visit she reported inadequate control of her  Restless legs, I have increased requip dose from 0.25 mg to 0.5mg  tab ., one at night pls send in after you spk with her, she may take TWO  0.25 mg tabs together at night ill done, I did say I would contact her after I had a chance to check on this Thanks, pls also send historically entered med to pharmacist

## 2016-06-16 ENCOUNTER — Other Ambulatory Visit: Payer: Self-pay

## 2016-06-16 MED ORDER — ROPINIROLE HCL 0.5 MG PO TABS: 0.5000 mg | ORAL_TABLET | Freq: Every day | ORAL | Status: DC

## 2016-06-16 NOTE — Telephone Encounter (Signed)
Message left for patient and med sent in

## 2016-06-18 DIAGNOSIS — C109 Malignant neoplasm of oropharynx, unspecified: Secondary | ICD-10-CM | POA: Diagnosis not present

## 2016-06-18 DIAGNOSIS — I2510 Atherosclerotic heart disease of native coronary artery without angina pectoris: Secondary | ICD-10-CM | POA: Diagnosis not present

## 2016-06-28 ENCOUNTER — Encounter (HOSPITAL_COMMUNITY): Payer: Self-pay | Admitting: *Deleted

## 2016-06-28 ENCOUNTER — Emergency Department (HOSPITAL_COMMUNITY)
Admission: EM | Admit: 2016-06-28 | Discharge: 2016-06-28 | Disposition: A | Discharge status: Home / Self Care | Payer: MEDICARE | Attending: Emergency Medicine | Admitting: Emergency Medicine

## 2016-06-28 DIAGNOSIS — Z7984 Long term (current) use of oral hypoglycemic drugs: Secondary | ICD-10-CM | POA: Diagnosis not present

## 2016-06-28 DIAGNOSIS — E119 Type 2 diabetes mellitus without complications: Secondary | ICD-10-CM | POA: Diagnosis not present

## 2016-06-28 DIAGNOSIS — E785 Hyperlipidemia, unspecified: Secondary | ICD-10-CM | POA: Insufficient documentation

## 2016-06-28 DIAGNOSIS — I10 Essential (primary) hypertension: Secondary | ICD-10-CM | POA: Diagnosis not present

## 2016-06-28 DIAGNOSIS — Z431 Encounter for attention to gastrostomy: Secondary | ICD-10-CM | POA: Diagnosis present

## 2016-06-28 DIAGNOSIS — Z87891 Personal history of nicotine dependence: Secondary | ICD-10-CM | POA: Insufficient documentation

## 2016-06-28 DIAGNOSIS — E7800 Pure hypercholesterolemia, unspecified: Secondary | ICD-10-CM | POA: Insufficient documentation

## 2016-06-28 DIAGNOSIS — Z85818 Personal history of malignant neoplasm of other sites of lip, oral cavity, and pharynx: Secondary | ICD-10-CM | POA: Diagnosis not present

## 2016-06-28 DIAGNOSIS — J449 Chronic obstructive pulmonary disease, unspecified: Secondary | ICD-10-CM | POA: Insufficient documentation

## 2016-06-28 DIAGNOSIS — K9423 Gastrostomy malfunction: Secondary | ICD-10-CM | POA: Insufficient documentation

## 2016-06-28 DIAGNOSIS — Z96651 Presence of right artificial knee joint: Secondary | ICD-10-CM | POA: Diagnosis not present

## 2016-06-28 DIAGNOSIS — T85598A Other mechanical complication of other gastrointestinal prosthetic devices, implants and grafts, initial encounter: Secondary | ICD-10-CM

## 2016-06-28 DIAGNOSIS — F329 Major depressive disorder, single episode, unspecified: Secondary | ICD-10-CM | POA: Diagnosis not present

## 2016-06-28 DIAGNOSIS — Z79899 Other long term (current) drug therapy: Secondary | ICD-10-CM | POA: Insufficient documentation

## 2016-06-28 NOTE — ED Notes (Signed)
Feeding tube and stomach contents aspirated, followed by flushing with no resistance or discomfort.  Cleaned around site using soap and water and guaze applied around peg tube to prevent any leakage.

## 2016-06-28 NOTE — ED Notes (Signed)
Pt states she is having difficulty with her feeding tube. Pt is unable to get food or medications administered through it. Pt states the insertion site is irritated and bloody.

## 2016-06-30 NOTE — ED Provider Notes (Signed)
CSN: [id number]     Arrival date & time 06/28/16  1042 History   First MD Initiated Contact with Patient 06/28/16 1147     Chief Complaint  Patient presents with  . Feeding tube problem      (Consider location/radiation/quality/duration/timing/severity/associated sxs/prior Treatment) HPI  Adrienne Nelson is a 65 y.o. female with hx of throat CA and jejunostomy feeding tube who presents to the Emergency Department complaining of possibly blocked feeding tube.  She states that she has been unable to push fluids or medications through the tube.  She also reports insertion site has been draining and at times appears bloody.  She denies abdominal pain, fever, chills, and chest pain.   Past Medical History  Diagnosis Date  . Hand pain     bilateral    . Arthritis of knee, right   . Depression   . Hearing loss   . Ulcerative colitis   . Hyperlipidemia   . Type 2 diabetes mellitus without complications ([place])   . Hypertension   . Hypercholesteremia   . IBS (irritable bowel syndrome)   . Diverticulosis   . Kidney stones   . Throat cancer [facility]) 2003    radiation treatment  . COPD (chronic obstructive pulmonary disease) ([place])   . Anxiety   . Shingles    Past Surgical History  Procedure Laterality Date  . Cesarean section    . Cesarean section    . Carpal tunnel release    . Cholecystectomy    . Total abdominal hysterectomy    . Left trigger finger surgery  Dec. 2011  . Colonoscopy N/A 04/06/2014    Procedure: COLONOSCOPY;  Surgeon: Rogene Houston, MD;  Location[facility];  Service: Endoscopy;  Laterality: N/A;  830  . Joint replacement Right 09/05/2014    knee  . Meniscus repair Left 2016  . Flexible sigmoidoscopy N/A 12/12/2015    Procedure: FLEXIBLE SIGMOIDOSCOPY;  Surgeon: Rogene Houston, MD;  Location: [facility];  Service: Endoscopy;  Laterality: N/A;  930  . Tonsillectomy Left 01/07/2016    Procedure: LEFT TONSILLECTOMY;  Surgeon: Leta Baptist, MD;  Location: [place];  Service: ENT;  Laterality: Left;  . Jejunostomy feeding tube    . Throat surgery     Family History  Problem Relation Age of Onset  . Lung cancer Mother 44  . Colon cancer Father 68   Social History  Substance Use Topics  . Smoking status: Former Smoker -- 1.00 packs/day for 20 years    Types: Cigarettes  . Smokeless tobacco: Never Used  . Alcohol Use: No   OB History    No data available     Review of Systems  Constitutional: Negative for fever, chills and fatigue.  HENT: Negative for sore throat and trouble swallowing.   Respiratory: Negative for cough, shortness of breath and wheezing.   Cardiovascular: Negative for chest pain and palpitations.  Gastrointestinal: Negative for nausea, vomiting and abdominal pain.       Blocked J tube  Genitourinary: Negative for flank pain.  Musculoskeletal: Negative for arthralgias.  Skin: Negative for rash.  Neurological: Negative for dizziness, weakness and numbness.  Hematological: Does not bruise/bleed easily.      Allergies  Penicillins; Elavil; Gabapentin; Ibuprofen; and Serax  Home Medications   Prior to Admission medications   Medication Sig Start Date End Date Taking? Authorizing Provider  amLODipine (NORVASC) 5 MG tablet Take 1 tablet (5 mg total) by mouth daily.  06/06/16  Yes Fayrene Helper, MD  Calcium Carbonate-Vitamin D (CALCIUM 600+D) 600-400 MG-UNIT per tablet Take 1 tablet by mouth 3 (three) times daily with meals.     Yes Historical Provider, MD  cloNIDine (CATAPRES) 0.3 MG tablet Take 1 tablet (0.3 mg total) by mouth 2 (two) times daily. 05/16/16  Yes Fayrene Helper, MD  docusate sodium (COLACE) 100 MG capsule Take 200 mg by mouth at bedtime.   Yes Historical Provider, MD  fentaNYL (DURAGESIC - DOSED MCG/HR) 12 MCG/HR Place 1 patch (12.5 mcg total) onto the skin every 3 (three) days. 01/16/16  Yes Patrici Ranks, MD  FLUoxetine (PROZAC) 40 MG capsule Take 1 capsule (40 mg total) by  mouth daily. 06/06/16  Yes Fayrene Helper, MD  lidocaine (XYLOCAINE) 2 % solution Apply to affected area every 2 hours as needed (dose change) 12/19/15  Yes Fayrene Helper, MD  lisinopril (PRINIVIL,ZESTRIL) 40 MG tablet Take 1 tablet (40 mg total) by mouth daily. 03/13/16  Yes Fayrene Helper, MD  metFORMIN (GLUCOPHAGE) 500 MG tablet TAKE ONE TABLET BY MOUTH ONCE DAILY WITH  BREAKFAST 06/06/16  Yes Fayrene Helper, MD  Multiple Vitamin (MULTIVITAMIN) tablet Take 1 tablet by mouth daily.     Yes Historical Provider, MD  oxyCODONE (ROXICODONE) 5 MG/5ML solution Take 10 mg by mouth every 4 (four) hours as needed. pain 04/21/16  Yes Historical Provider, MD  polyethylene glycol (MIRALAX / GLYCOLAX) packet Take 17 g by mouth daily.   Yes Historical Provider, MD  potassium chloride SA (KLOR-CON M20) 20 MEQ tablet Take 1 tablet (20 mEq total) by mouth 2 (two) times daily. 11/19/15  Yes Fayrene Helper, MD  rOPINIRole (REQUIP) 0.5 MG tablet Take 1 tablet (0.5 mg total) by mouth at bedtime. 06/16/16  Yes Fayrene Helper, MD  simvastatin (ZOCOR) 40 MG tablet TAKE ONE TABLET BY MOUTH AT BEDTIME 12/06/15  Yes Fayrene Helper, MD  traZODone (DESYREL) 100 MG tablet Two tablets at bedtime Patient taking differently: Take 200 mg by mouth at bedtime. Two tablets at bedtime 06/06/16  Yes Fayrene Helper, MD  venlafaxine [facility]) 37.5 MG tablet Take 37.5 mg by mouth 2 (two) times daily.   Yes Historical Provider, MD   BP 152/71 mmHg  Pulse 54  Temp(Src) 98 F (36.7 C) (Oral)  Resp 18  Ht 4\' 11"  (1.499 m)  Wt 78.472 kg  BMI 34.92 kg/m2  SpO2 96% Physical Exam  Constitutional: She is oriented to person, place, and time. She appears well-developed and well-nourished. No distress.  HENT:  Head: Normocephalic and atraumatic.  Mouth/Throat: Oropharynx is clear and moist.  Neck: Normal range of motion. Neck supple.  Cardiovascular: Normal rate and regular rhythm.   Pulmonary/Chest: Effort  normal and breath sounds normal. No respiratory distress. She exhibits no tenderness.  Abdominal: Soft. She exhibits no distension. There is no tenderness. There is no rebound and no guarding.  J tube left upper abdomen.  No bleeding, site appears clean.  No edema or erythema  Musculoskeletal: Normal range of motion. She exhibits no tenderness.  Lymphadenopathy:    She has no cervical adenopathy.  Neurological: She is alert and oriented to person, place, and time. She exhibits normal muscle tone. Coordination normal.  Skin: Skin is warm and dry.  Nursing note and vitals reviewed.   ED Course  Procedures (including critical care time) Labs Review Labs Reviewed - No data to display  Imaging Review No results found. I have  personally reviewed and evaluated these images and lab results as part of my medical decision-making.   EKG Interpretation None      MDM   Final diagnoses:  Feeding tube dysfunction, initial encounter    Pt is well appearing.  J tube was flushed by nursing staff with resolution.  Now patent.  Water pushed through the tubing without difficulty.  Stoma cleaned and gauze applied.  Return precautions given.  Pt stable for home.     Kem Parkinson, PA-C 06/30/16 [place], MD 07/02/16 1755

## 2016-07-09 ENCOUNTER — Other Ambulatory Visit: Payer: Self-pay | Admitting: Family Medicine

## 2016-07-09 ENCOUNTER — Other Ambulatory Visit: Payer: Self-pay

## 2016-07-09 MED ORDER — CLONIDINE HCL 0.3 MG PO TABS: 0.3000 mg | ORAL_TABLET | Freq: Two times a day (BID) | ORAL | Status: DC

## 2016-07-14 ENCOUNTER — Other Ambulatory Visit: Payer: Self-pay

## 2016-07-14 DIAGNOSIS — G4700 Insomnia, unspecified: Secondary | ICD-10-CM

## 2016-07-14 MED ORDER — AMLODIPINE BESYLATE 5 MG PO TABS: 5.0000 mg | ORAL_TABLET | Freq: Every day | ORAL | 5 refills | Status: DC

## 2016-07-14 MED ORDER — VENLAFAXINE HCL 37.5 MG PO TABS: 37.5000 mg | ORAL_TABLET | Freq: Two times a day (BID) | ORAL | 5 refills | Status: AC

## 2016-07-14 MED ORDER — TRAZODONE HCL 100 MG PO TABS: ORAL_TABLET | ORAL | 2 refills | Status: DC

## 2016-07-14 MED ORDER — LISINOPRIL 40 MG PO TABS
40.0000 mg | ORAL_TABLET | Freq: Every day | ORAL | 5 refills | Status: DC
Start: 2016-07-14 — End: 2016-09-09

## 2016-07-14 MED ORDER — METFORMIN HCL 500 MG PO TABS: ORAL_TABLET | ORAL | 5 refills | Status: DC

## 2016-07-14 MED ORDER — CLONIDINE HCL 0.3 MG PO TABS: 0.3000 mg | ORAL_TABLET | Freq: Two times a day (BID) | ORAL | 5 refills | Status: DC

## 2016-07-14 MED ORDER — POTASSIUM CHLORIDE CRYS ER 20 MEQ PO TBCR: 20.0000 meq | EXTENDED_RELEASE_TABLET | Freq: Two times a day (BID) | ORAL | 5 refills | Status: AC

## 2016-07-14 MED ORDER — ROPINIROLE HCL 0.5 MG PO TABS: 0.5000 mg | ORAL_TABLET | Freq: Every day | ORAL | 5 refills | Status: DC

## 2016-07-14 MED ORDER — FLUOXETINE HCL 40 MG PO CAPS: 40.0000 mg | ORAL_CAPSULE | Freq: Every day | ORAL | 5 refills | Status: DC

## 2016-07-14 MED ORDER — SIMVASTATIN 40 MG PO TABS: 40.0000 mg | ORAL_TABLET | Freq: Every day | ORAL | 5 refills | Status: AC

## 2016-09-02 IMAGING — US US BREAST LTD UNI RIGHT INC AXILLA
1 series · 8 of 8 positions shown · non-contrast
Comparison: Previous exam(s).

CLINICAL DATA: Screening recall for possible right breast mass seen
on PET-CT dated 12/05/2015. Patient has a history of head and neck
cancer. She currently takes low-dose aspirin. She does not recall
any specific trauma to the right breast, however states she does
have pets that tend to jump on her and cause bruising frequently.

EXAM:
DIGITAL DIAGNOSTIC BILATERAL MAMMOGRAM WITH 3D TOMOSYNTHESIS AND CAD
RIGHT BREAST ULTRASOUND

[Series 1: us breast ltd uni right inc axilla · 0.07mm/px · 8 of 8 slices shown]
[im 1/8]
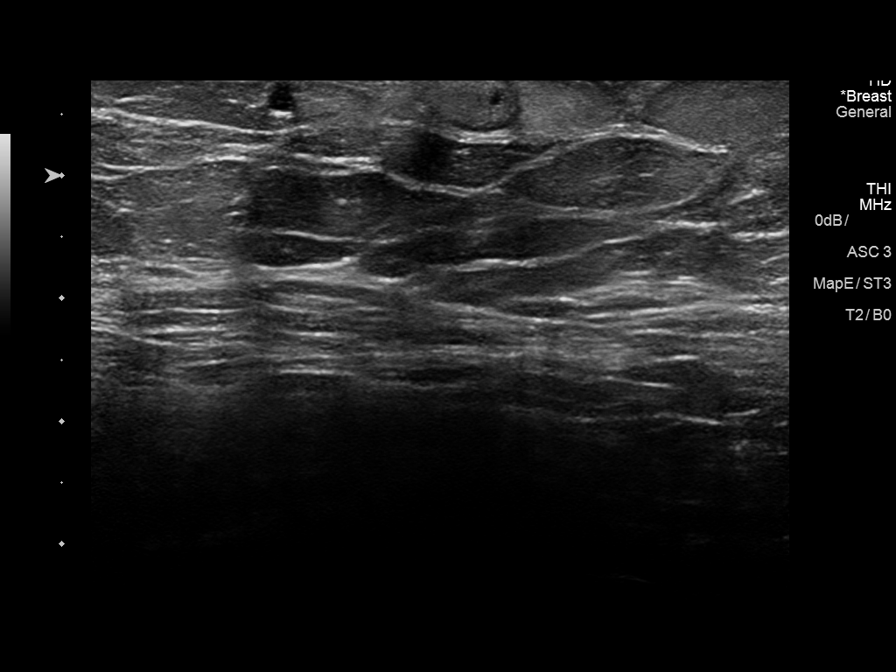
[im 2/8]
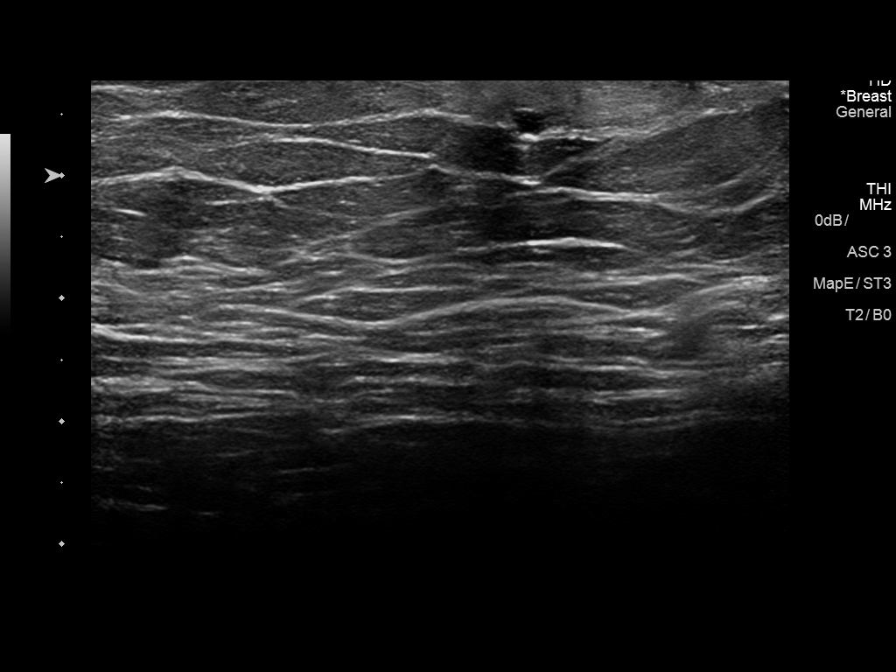
[im 3/8]
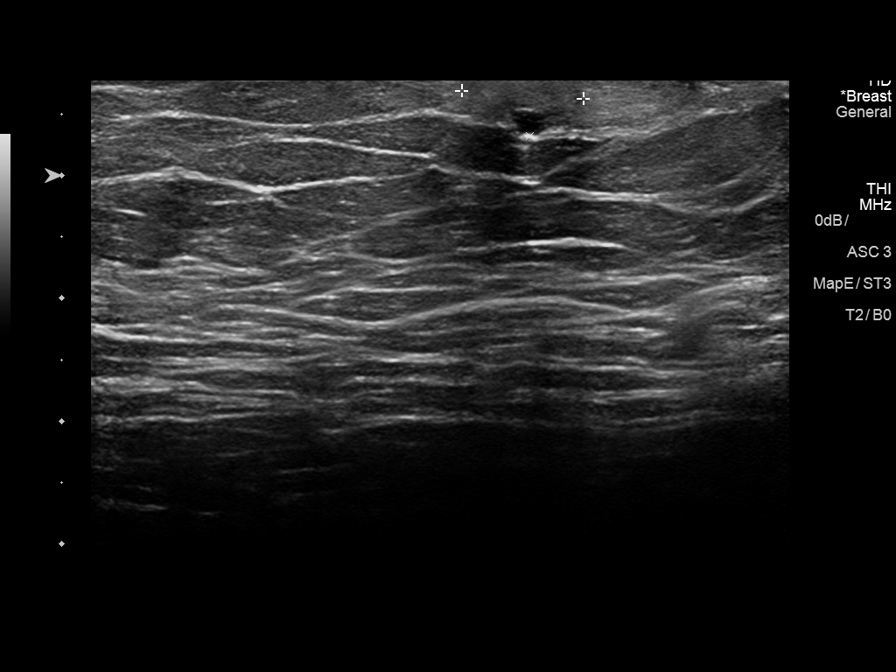
[im 4/8]
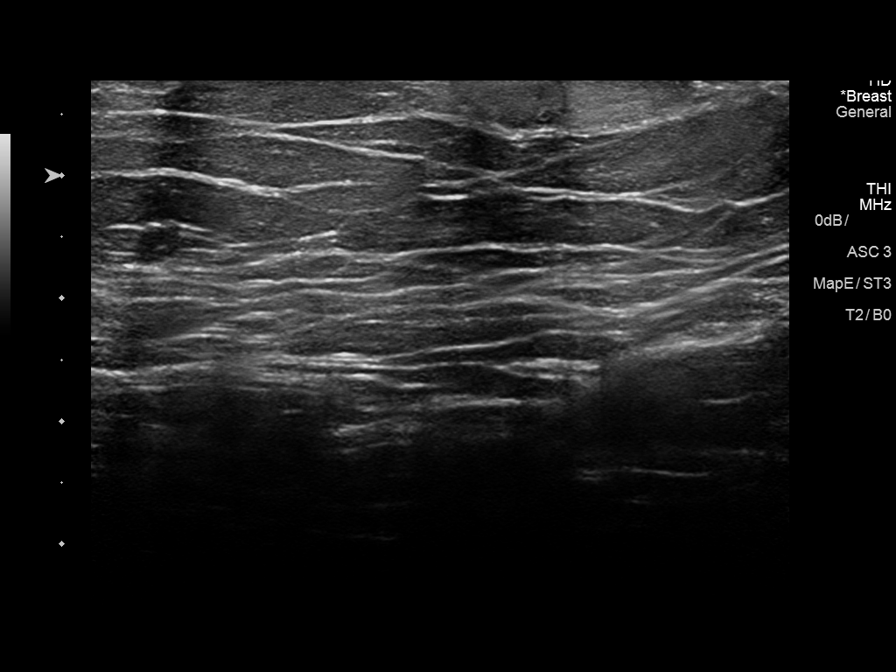
[im 5/8]
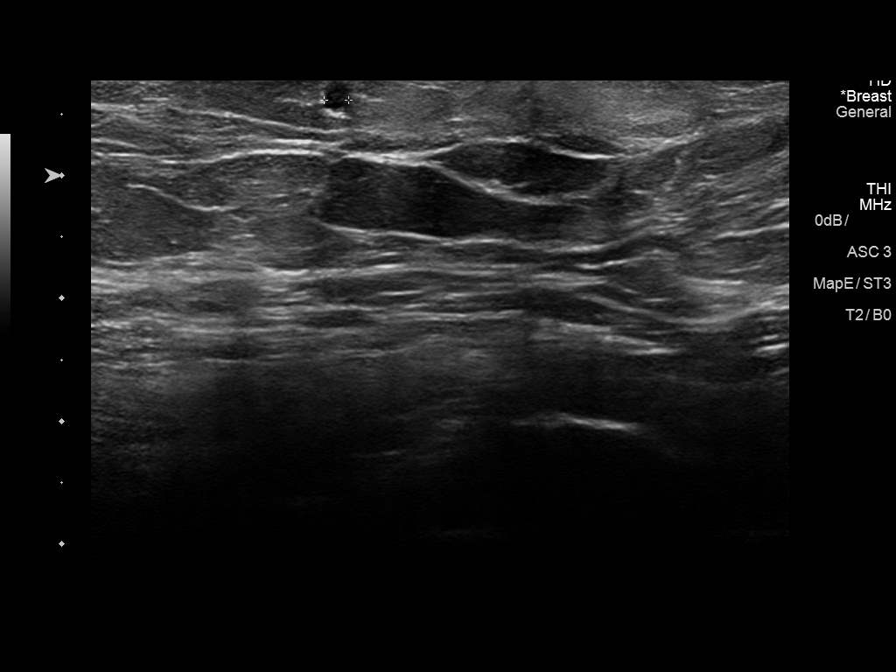
[im 6/8]
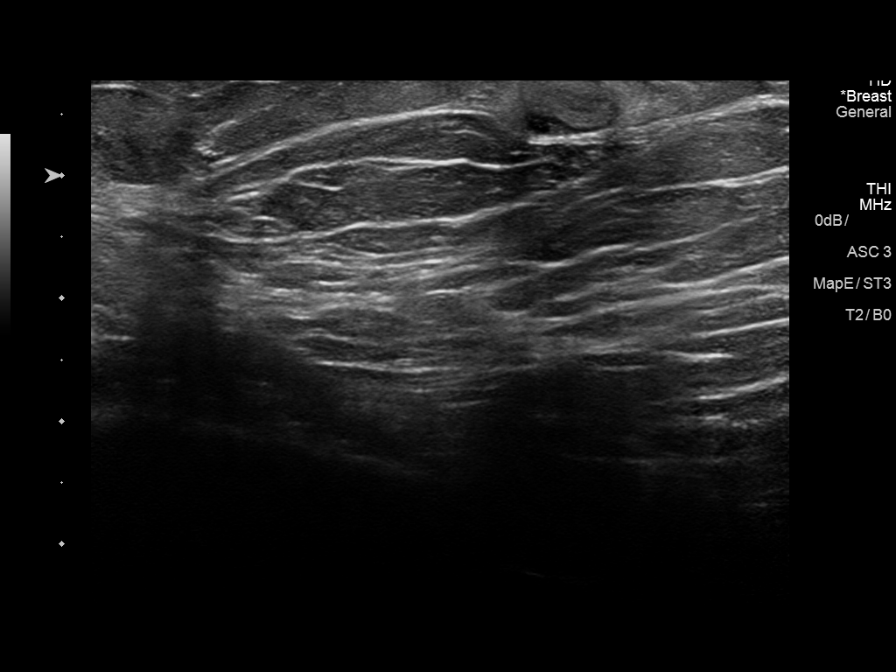
[im 7/8]
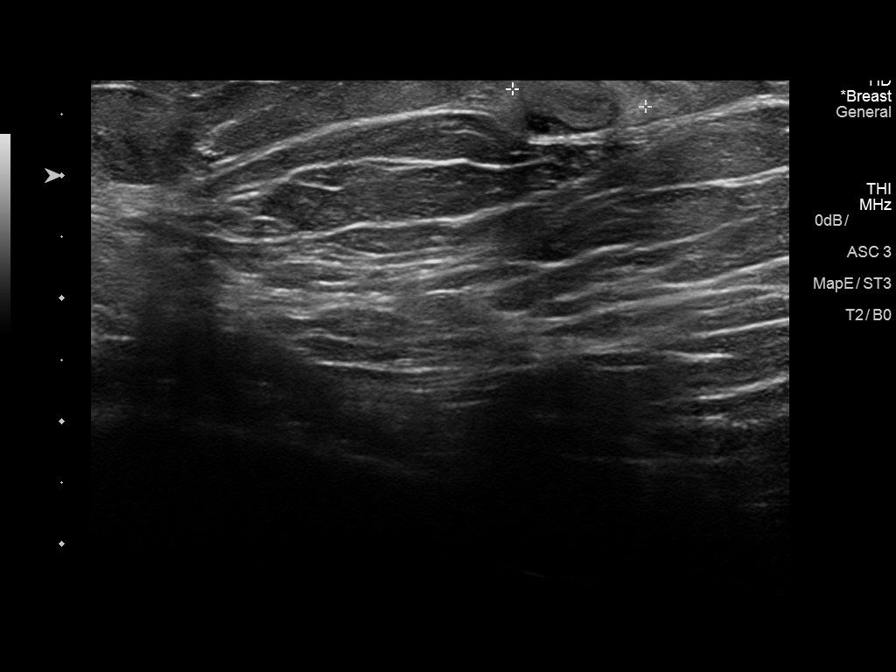
[im 8/8]
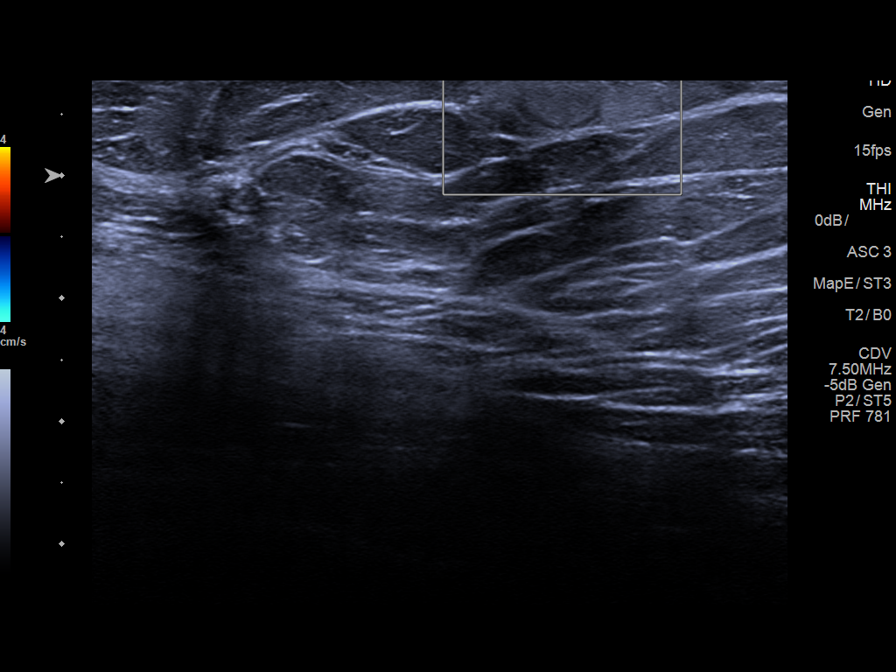

[8 of 8 positions shown; findings below may reference images not displayed]

ACR Breast Density Category b: There are scattered areas of
fibroglandular density.
FINDINGS: There is a circumscribed oval mixed density mass in the lower outer
right breast superficial depth measuring approximately 11 mm which
correlates well with the findings seen on recent PET-CT. No
suspicious masses or calcifications are seen in the left breast. An
initially questioned asymmetry seen in the left breast on the MLO
view only resolves on the additional spot compression MLO
tomosynthesis images.

Mammographic images were processed with CAD.

Physical examination of the lower outer right breast reveals a firm
nodule in the periareolar right breast at the approximate 11 o'clock
position.

Targeted ultrasound of the right breast was performed demonstrating
an oval mixed density predominantly hyperechoic mass at 11 o'clock 1
cm from the nipple just beneath the skin surface measuring 1 x 0.6 x
1.1 cm. This corresponds with mammography findings and demonstrates
imaging features most suggestive of fat necrosis. This also likely
correlates with the abnormality seen on recent PET-CT from
12/05/2015.
IMPRESSION: Probably benign right breast mass, likely an area of fat necrosis.

RECOMMENDATION:
Short term follow-up diagnostic mammography right breast 10-12 weeks
to demonstrate stability/resolution of the probably benign findings
in the right breast. If this area in the right breast appears
smaller or resolved at time of follow-up, then recommend return to
routine screening mammography.

I have discussed the findings and recommendations with the patient.
Results were also provided in writing at the conclusion of the
visit. If applicable, a reminder letter will be sent to the patient
regarding the next appointment.

BI-RADS CATEGORY  3: Probably benign.

## 2016-09-05 ENCOUNTER — Other Ambulatory Visit: Payer: Self-pay | Admitting: Family Medicine

## 2016-09-08 ENCOUNTER — Telehealth: Payer: Self-pay | Admitting: Family Medicine

## 2016-09-08 NOTE — Telephone Encounter (Signed)
Adrienne Nelson is stating that she was given 6 refills on her medications until she can find a Dr in [place] but she is saying that they wont go through, please advise?

## 2016-09-09 ENCOUNTER — Other Ambulatory Visit: Payer: Self-pay

## 2016-09-09 DIAGNOSIS — G4700 Insomnia, unspecified: Secondary | ICD-10-CM

## 2016-09-09 MED ORDER — CLONIDINE HCL 0.3 MG PO TABS: 0.3000 mg | ORAL_TABLET | Freq: Two times a day (BID) | ORAL | 5 refills | Status: AC

## 2016-09-09 MED ORDER — ROPINIROLE HCL 0.5 MG PO TABS: 0.5000 mg | ORAL_TABLET | Freq: Every day | ORAL | 5 refills | Status: AC

## 2016-09-09 MED ORDER — TRAZODONE HCL 100 MG PO TABS: ORAL_TABLET | ORAL | 5 refills | Status: DC

## 2016-09-09 MED ORDER — FLUOXETINE HCL 40 MG PO CAPS: 40.0000 mg | ORAL_CAPSULE | Freq: Every day | ORAL | 5 refills | Status: AC

## 2016-09-09 MED ORDER — LISINOPRIL 40 MG PO TABS: 40.0000 mg | ORAL_TABLET | Freq: Every day | ORAL | 5 refills | Status: AC

## 2016-09-09 MED ORDER — AMLODIPINE BESYLATE 5 MG PO TABS: 5.0000 mg | ORAL_TABLET | Freq: Every day | ORAL | 5 refills | Status: DC

## 2016-09-09 MED ORDER — METFORMIN HCL 500 MG PO TABS: ORAL_TABLET | ORAL | 5 refills | Status: AC

## 2016-09-09 NOTE — Telephone Encounter (Signed)
Spoke with patient and refill sent in

## 2016-09-16 DIAGNOSIS — I77810 Thoracic aortic ectasia: Secondary | ICD-10-CM | POA: Diagnosis not present

## 2016-09-16 DIAGNOSIS — R062 Wheezing: Secondary | ICD-10-CM | POA: Diagnosis not present

## 2016-09-16 DIAGNOSIS — F419 Anxiety disorder, unspecified: Secondary | ICD-10-CM | POA: Diagnosis not present

## 2016-09-16 DIAGNOSIS — C140 Malignant neoplasm of pharynx, unspecified: Secondary | ICD-10-CM | POA: Diagnosis not present

## 2016-09-16 DIAGNOSIS — R0689 Other abnormalities of breathing: Secondary | ICD-10-CM | POA: Diagnosis not present

## 2016-09-16 DIAGNOSIS — Z87891 Personal history of nicotine dependence: Secondary | ICD-10-CM | POA: Diagnosis not present

## 2016-09-16 DIAGNOSIS — E876 Hypokalemia: Secondary | ICD-10-CM | POA: Diagnosis not present

## 2016-09-16 DIAGNOSIS — Z7984 Long term (current) use of oral hypoglycemic drugs: Secondary | ICD-10-CM | POA: Diagnosis not present

## 2016-09-16 DIAGNOSIS — Z85818 Personal history of malignant neoplasm of other sites of lip, oral cavity, and pharynx: Secondary | ICD-10-CM | POA: Diagnosis not present

## 2016-09-16 DIAGNOSIS — Z931 Gastrostomy status: Secondary | ICD-10-CM | POA: Diagnosis not present

## 2016-09-16 DIAGNOSIS — Z86718 Personal history of other venous thrombosis and embolism: Secondary | ICD-10-CM | POA: Diagnosis not present

## 2016-09-16 DIAGNOSIS — R0602 Shortness of breath: Secondary | ICD-10-CM | POA: Diagnosis not present

## 2016-09-16 DIAGNOSIS — I10 Essential (primary) hypertension: Secondary | ICD-10-CM | POA: Diagnosis not present

## 2016-09-16 DIAGNOSIS — E1165 Type 2 diabetes mellitus with hyperglycemia: Secondary | ICD-10-CM | POA: Diagnosis not present

## 2016-09-16 DIAGNOSIS — M549 Dorsalgia, unspecified: Secondary | ICD-10-CM | POA: Diagnosis not present

## 2016-09-16 DIAGNOSIS — Z8521 Personal history of malignant neoplasm of larynx: Secondary | ICD-10-CM | POA: Diagnosis not present

## 2016-09-16 DIAGNOSIS — F329 Major depressive disorder, single episode, unspecified: Secondary | ICD-10-CM | POA: Diagnosis not present

## 2016-09-16 DIAGNOSIS — I517 Cardiomegaly: Secondary | ICD-10-CM | POA: Diagnosis not present

## 2016-09-16 DIAGNOSIS — R1310 Dysphagia, unspecified: Secondary | ICD-10-CM | POA: Diagnosis not present

## 2016-09-16 DIAGNOSIS — J209 Acute bronchitis, unspecified: Secondary | ICD-10-CM | POA: Diagnosis not present

## 2016-09-16 DIAGNOSIS — K580 Irritable bowel syndrome with diarrhea: Secondary | ICD-10-CM | POA: Diagnosis not present

## 2016-09-17 DIAGNOSIS — J9811 Atelectasis: Secondary | ICD-10-CM | POA: Diagnosis not present

## 2016-09-17 DIAGNOSIS — R1310 Dysphagia, unspecified: Secondary | ICD-10-CM | POA: Diagnosis not present

## 2016-09-17 DIAGNOSIS — C140 Malignant neoplasm of pharynx, unspecified: Secondary | ICD-10-CM | POA: Diagnosis not present

## 2016-09-17 DIAGNOSIS — E876 Hypokalemia: Secondary | ICD-10-CM | POA: Diagnosis not present

## 2016-09-17 DIAGNOSIS — M898X8 Other specified disorders of bone, other site: Secondary | ICD-10-CM | POA: Diagnosis not present

## 2016-09-17 DIAGNOSIS — R1319 Other dysphagia: Secondary | ICD-10-CM | POA: Diagnosis not present

## 2016-09-17 DIAGNOSIS — G8929 Other chronic pain: Secondary | ICD-10-CM | POA: Diagnosis not present

## 2016-09-17 DIAGNOSIS — Z515 Encounter for palliative care: Secondary | ICD-10-CM | POA: Diagnosis not present

## 2016-09-17 DIAGNOSIS — J40 Bronchitis, not specified as acute or chronic: Secondary | ICD-10-CM | POA: Diagnosis not present

## 2016-09-17 DIAGNOSIS — J209 Acute bronchitis, unspecified: Secondary | ICD-10-CM | POA: Diagnosis not present

## 2016-09-17 DIAGNOSIS — R05 Cough: Secondary | ICD-10-CM | POA: Diagnosis not present

## 2016-09-17 DIAGNOSIS — R0602 Shortness of breath: Secondary | ICD-10-CM | POA: Diagnosis not present

## 2016-09-18 DIAGNOSIS — G8929 Other chronic pain: Secondary | ICD-10-CM | POA: Diagnosis not present

## 2016-09-18 DIAGNOSIS — C140 Malignant neoplasm of pharynx, unspecified: Secondary | ICD-10-CM | POA: Diagnosis not present

## 2016-09-18 DIAGNOSIS — F419 Anxiety disorder, unspecified: Secondary | ICD-10-CM | POA: Diagnosis not present

## 2016-09-18 DIAGNOSIS — J209 Acute bronchitis, unspecified: Secondary | ICD-10-CM | POA: Diagnosis not present

## 2016-09-18 DIAGNOSIS — R1310 Dysphagia, unspecified: Secondary | ICD-10-CM | POA: Diagnosis not present

## 2016-09-18 DIAGNOSIS — Z515 Encounter for palliative care: Secondary | ICD-10-CM | POA: Diagnosis not present

## 2016-09-18 DIAGNOSIS — E876 Hypokalemia: Secondary | ICD-10-CM | POA: Diagnosis not present

## 2016-09-18 DIAGNOSIS — R1319 Other dysphagia: Secondary | ICD-10-CM | POA: Diagnosis not present

## 2016-09-19 DIAGNOSIS — Z7984 Long term (current) use of oral hypoglycemic drugs: Secondary | ICD-10-CM | POA: Diagnosis not present

## 2016-09-19 DIAGNOSIS — Z431 Encounter for attention to gastrostomy: Secondary | ICD-10-CM | POA: Diagnosis not present

## 2016-09-19 DIAGNOSIS — C051 Malignant neoplasm of soft palate: Secondary | ICD-10-CM | POA: Diagnosis not present

## 2016-09-19 DIAGNOSIS — F329 Major depressive disorder, single episode, unspecified: Secondary | ICD-10-CM | POA: Diagnosis not present

## 2016-09-19 DIAGNOSIS — M6281 Muscle weakness (generalized): Secondary | ICD-10-CM | POA: Diagnosis not present

## 2016-09-19 DIAGNOSIS — C140 Malignant neoplasm of pharynx, unspecified: Secondary | ICD-10-CM | POA: Diagnosis not present

## 2016-09-19 DIAGNOSIS — E119 Type 2 diabetes mellitus without complications: Secondary | ICD-10-CM | POA: Diagnosis not present

## 2016-09-19 DIAGNOSIS — Z9181 History of falling: Secondary | ICD-10-CM | POA: Diagnosis not present

## 2016-09-19 DIAGNOSIS — I10 Essential (primary) hypertension: Secondary | ICD-10-CM | POA: Diagnosis not present

## 2016-09-19 DIAGNOSIS — J209 Acute bronchitis, unspecified: Secondary | ICD-10-CM | POA: Diagnosis not present

## 2016-09-19 DIAGNOSIS — Z87891 Personal history of nicotine dependence: Secondary | ICD-10-CM | POA: Diagnosis not present

## 2016-09-22 DIAGNOSIS — E119 Type 2 diabetes mellitus without complications: Secondary | ICD-10-CM | POA: Diagnosis not present

## 2016-09-22 DIAGNOSIS — Z431 Encounter for attention to gastrostomy: Secondary | ICD-10-CM | POA: Diagnosis not present

## 2016-09-22 DIAGNOSIS — M6281 Muscle weakness (generalized): Secondary | ICD-10-CM | POA: Diagnosis not present

## 2016-09-22 DIAGNOSIS — J209 Acute bronchitis, unspecified: Secondary | ICD-10-CM | POA: Diagnosis not present

## 2016-09-22 DIAGNOSIS — C140 Malignant neoplasm of pharynx, unspecified: Secondary | ICD-10-CM | POA: Diagnosis not present

## 2016-09-22 DIAGNOSIS — C051 Malignant neoplasm of soft palate: Secondary | ICD-10-CM | POA: Diagnosis not present

## 2016-09-24 DIAGNOSIS — J209 Acute bronchitis, unspecified: Secondary | ICD-10-CM | POA: Diagnosis not present

## 2016-09-24 DIAGNOSIS — E119 Type 2 diabetes mellitus without complications: Secondary | ICD-10-CM | POA: Diagnosis not present

## 2016-09-24 DIAGNOSIS — C140 Malignant neoplasm of pharynx, unspecified: Secondary | ICD-10-CM | POA: Diagnosis not present

## 2016-09-24 DIAGNOSIS — C051 Malignant neoplasm of soft palate: Secondary | ICD-10-CM | POA: Diagnosis not present

## 2016-09-24 DIAGNOSIS — M6281 Muscle weakness (generalized): Secondary | ICD-10-CM | POA: Diagnosis not present

## 2016-09-24 DIAGNOSIS — Z431 Encounter for attention to gastrostomy: Secondary | ICD-10-CM | POA: Diagnosis not present

## 2016-09-26 DIAGNOSIS — C140 Malignant neoplasm of pharynx, unspecified: Secondary | ICD-10-CM | POA: Diagnosis not present

## 2016-09-26 DIAGNOSIS — Z431 Encounter for attention to gastrostomy: Secondary | ICD-10-CM | POA: Diagnosis not present

## 2016-09-26 DIAGNOSIS — J209 Acute bronchitis, unspecified: Secondary | ICD-10-CM | POA: Diagnosis not present

## 2016-09-26 DIAGNOSIS — E119 Type 2 diabetes mellitus without complications: Secondary | ICD-10-CM | POA: Diagnosis not present

## 2016-09-26 DIAGNOSIS — M6281 Muscle weakness (generalized): Secondary | ICD-10-CM | POA: Diagnosis not present

## 2016-09-26 DIAGNOSIS — C051 Malignant neoplasm of soft palate: Secondary | ICD-10-CM | POA: Diagnosis not present

## 2016-09-29 DIAGNOSIS — C140 Malignant neoplasm of pharynx, unspecified: Secondary | ICD-10-CM | POA: Diagnosis not present

## 2016-09-29 DIAGNOSIS — M6281 Muscle weakness (generalized): Secondary | ICD-10-CM | POA: Diagnosis not present

## 2016-09-29 DIAGNOSIS — Z431 Encounter for attention to gastrostomy: Secondary | ICD-10-CM | POA: Diagnosis not present

## 2016-09-29 DIAGNOSIS — E119 Type 2 diabetes mellitus without complications: Secondary | ICD-10-CM | POA: Diagnosis not present

## 2016-09-29 DIAGNOSIS — C051 Malignant neoplasm of soft palate: Secondary | ICD-10-CM | POA: Diagnosis not present

## 2016-09-29 DIAGNOSIS — J209 Acute bronchitis, unspecified: Secondary | ICD-10-CM | POA: Diagnosis not present

## 2016-09-30 DIAGNOSIS — E119 Type 2 diabetes mellitus without complications: Secondary | ICD-10-CM | POA: Diagnosis not present

## 2016-09-30 DIAGNOSIS — C051 Malignant neoplasm of soft palate: Secondary | ICD-10-CM | POA: Diagnosis not present

## 2016-09-30 DIAGNOSIS — J209 Acute bronchitis, unspecified: Secondary | ICD-10-CM | POA: Diagnosis not present

## 2016-09-30 DIAGNOSIS — M6281 Muscle weakness (generalized): Secondary | ICD-10-CM | POA: Diagnosis not present

## 2016-09-30 DIAGNOSIS — Z431 Encounter for attention to gastrostomy: Secondary | ICD-10-CM | POA: Diagnosis not present

## 2016-09-30 DIAGNOSIS — C140 Malignant neoplasm of pharynx, unspecified: Secondary | ICD-10-CM | POA: Diagnosis not present

## 2016-10-01 DIAGNOSIS — M6281 Muscle weakness (generalized): Secondary | ICD-10-CM | POA: Diagnosis not present

## 2016-10-01 DIAGNOSIS — Z431 Encounter for attention to gastrostomy: Secondary | ICD-10-CM | POA: Diagnosis not present

## 2016-10-01 DIAGNOSIS — C140 Malignant neoplasm of pharynx, unspecified: Secondary | ICD-10-CM | POA: Diagnosis not present

## 2016-10-01 DIAGNOSIS — C051 Malignant neoplasm of soft palate: Secondary | ICD-10-CM | POA: Diagnosis not present

## 2016-10-01 DIAGNOSIS — E119 Type 2 diabetes mellitus without complications: Secondary | ICD-10-CM | POA: Diagnosis not present

## 2016-10-01 DIAGNOSIS — J209 Acute bronchitis, unspecified: Secondary | ICD-10-CM | POA: Diagnosis not present

## 2016-10-03 DIAGNOSIS — E119 Type 2 diabetes mellitus without complications: Secondary | ICD-10-CM | POA: Diagnosis not present

## 2016-10-03 DIAGNOSIS — M6281 Muscle weakness (generalized): Secondary | ICD-10-CM | POA: Diagnosis not present

## 2016-10-03 DIAGNOSIS — Z431 Encounter for attention to gastrostomy: Secondary | ICD-10-CM | POA: Diagnosis not present

## 2016-10-03 DIAGNOSIS — J209 Acute bronchitis, unspecified: Secondary | ICD-10-CM | POA: Diagnosis not present

## 2016-10-03 DIAGNOSIS — C140 Malignant neoplasm of pharynx, unspecified: Secondary | ICD-10-CM | POA: Diagnosis not present

## 2016-10-03 DIAGNOSIS — C051 Malignant neoplasm of soft palate: Secondary | ICD-10-CM | POA: Diagnosis not present

## 2016-10-06 DIAGNOSIS — E119 Type 2 diabetes mellitus without complications: Secondary | ICD-10-CM | POA: Diagnosis not present

## 2016-10-06 DIAGNOSIS — C051 Malignant neoplasm of soft palate: Secondary | ICD-10-CM | POA: Diagnosis not present

## 2016-10-06 DIAGNOSIS — Z431 Encounter for attention to gastrostomy: Secondary | ICD-10-CM | POA: Diagnosis not present

## 2016-10-06 DIAGNOSIS — C140 Malignant neoplasm of pharynx, unspecified: Secondary | ICD-10-CM | POA: Diagnosis not present

## 2016-10-06 DIAGNOSIS — M6281 Muscle weakness (generalized): Secondary | ICD-10-CM | POA: Diagnosis not present

## 2016-10-06 DIAGNOSIS — J209 Acute bronchitis, unspecified: Secondary | ICD-10-CM | POA: Diagnosis not present

## 2016-10-09 DIAGNOSIS — Z431 Encounter for attention to gastrostomy: Secondary | ICD-10-CM | POA: Diagnosis not present

## 2016-10-09 DIAGNOSIS — C140 Malignant neoplasm of pharynx, unspecified: Secondary | ICD-10-CM | POA: Diagnosis not present

## 2016-10-09 DIAGNOSIS — E119 Type 2 diabetes mellitus without complications: Secondary | ICD-10-CM | POA: Diagnosis not present

## 2016-10-09 DIAGNOSIS — C051 Malignant neoplasm of soft palate: Secondary | ICD-10-CM | POA: Diagnosis not present

## 2016-10-09 DIAGNOSIS — J209 Acute bronchitis, unspecified: Secondary | ICD-10-CM | POA: Diagnosis not present

## 2016-10-09 DIAGNOSIS — M6281 Muscle weakness (generalized): Secondary | ICD-10-CM | POA: Diagnosis not present

## 2016-10-10 DIAGNOSIS — E119 Type 2 diabetes mellitus without complications: Secondary | ICD-10-CM | POA: Diagnosis not present

## 2016-10-10 DIAGNOSIS — J209 Acute bronchitis, unspecified: Secondary | ICD-10-CM | POA: Diagnosis not present

## 2016-10-10 DIAGNOSIS — C051 Malignant neoplasm of soft palate: Secondary | ICD-10-CM | POA: Diagnosis not present

## 2016-10-10 DIAGNOSIS — M6281 Muscle weakness (generalized): Secondary | ICD-10-CM | POA: Diagnosis not present

## 2016-10-10 DIAGNOSIS — Z431 Encounter for attention to gastrostomy: Secondary | ICD-10-CM | POA: Diagnosis not present

## 2016-10-10 DIAGNOSIS — C140 Malignant neoplasm of pharynx, unspecified: Secondary | ICD-10-CM | POA: Diagnosis not present

## 2016-10-15 DIAGNOSIS — Z431 Encounter for attention to gastrostomy: Secondary | ICD-10-CM | POA: Diagnosis not present

## 2016-10-15 DIAGNOSIS — C140 Malignant neoplasm of pharynx, unspecified: Secondary | ICD-10-CM | POA: Diagnosis not present

## 2016-10-15 DIAGNOSIS — M6281 Muscle weakness (generalized): Secondary | ICD-10-CM | POA: Diagnosis not present

## 2016-10-15 DIAGNOSIS — C051 Malignant neoplasm of soft palate: Secondary | ICD-10-CM | POA: Diagnosis not present

## 2016-10-15 DIAGNOSIS — E119 Type 2 diabetes mellitus without complications: Secondary | ICD-10-CM | POA: Diagnosis not present

## 2016-10-15 DIAGNOSIS — J209 Acute bronchitis, unspecified: Secondary | ICD-10-CM | POA: Diagnosis not present

## 2016-10-17 DIAGNOSIS — M6281 Muscle weakness (generalized): Secondary | ICD-10-CM | POA: Diagnosis not present

## 2016-10-17 DIAGNOSIS — C140 Malignant neoplasm of pharynx, unspecified: Secondary | ICD-10-CM | POA: Diagnosis not present

## 2016-10-17 DIAGNOSIS — Z431 Encounter for attention to gastrostomy: Secondary | ICD-10-CM | POA: Diagnosis not present

## 2016-10-17 DIAGNOSIS — E119 Type 2 diabetes mellitus without complications: Secondary | ICD-10-CM | POA: Diagnosis not present

## 2016-10-17 DIAGNOSIS — C051 Malignant neoplasm of soft palate: Secondary | ICD-10-CM | POA: Diagnosis not present

## 2016-10-17 DIAGNOSIS — J209 Acute bronchitis, unspecified: Secondary | ICD-10-CM | POA: Diagnosis not present

## 2016-10-20 DIAGNOSIS — E119 Type 2 diabetes mellitus without complications: Secondary | ICD-10-CM | POA: Diagnosis not present

## 2016-10-20 DIAGNOSIS — Z23 Encounter for immunization: Secondary | ICD-10-CM | POA: Diagnosis not present

## 2016-10-20 DIAGNOSIS — R32 Unspecified urinary incontinence: Secondary | ICD-10-CM | POA: Diagnosis not present

## 2016-10-20 DIAGNOSIS — C140 Malignant neoplasm of pharynx, unspecified: Secondary | ICD-10-CM | POA: Diagnosis not present

## 2016-10-20 DIAGNOSIS — B029 Zoster without complications: Secondary | ICD-10-CM | POA: Diagnosis not present

## 2016-10-20 DIAGNOSIS — Z760 Encounter for issue of repeat prescription: Secondary | ICD-10-CM | POA: Diagnosis not present

## 2016-10-21 DIAGNOSIS — C140 Malignant neoplasm of pharynx, unspecified: Secondary | ICD-10-CM | POA: Diagnosis not present

## 2016-10-21 DIAGNOSIS — J209 Acute bronchitis, unspecified: Secondary | ICD-10-CM | POA: Diagnosis not present

## 2016-10-21 DIAGNOSIS — C051 Malignant neoplasm of soft palate: Secondary | ICD-10-CM | POA: Diagnosis not present

## 2016-10-21 DIAGNOSIS — M6281 Muscle weakness (generalized): Secondary | ICD-10-CM | POA: Diagnosis not present

## 2016-10-21 DIAGNOSIS — E119 Type 2 diabetes mellitus without complications: Secondary | ICD-10-CM | POA: Diagnosis not present

## 2016-10-21 DIAGNOSIS — Z431 Encounter for attention to gastrostomy: Secondary | ICD-10-CM | POA: Diagnosis not present

## 2016-10-22 DIAGNOSIS — J209 Acute bronchitis, unspecified: Secondary | ICD-10-CM | POA: Diagnosis not present

## 2016-10-22 DIAGNOSIS — E119 Type 2 diabetes mellitus without complications: Secondary | ICD-10-CM | POA: Diagnosis not present

## 2016-10-22 DIAGNOSIS — C140 Malignant neoplasm of pharynx, unspecified: Secondary | ICD-10-CM | POA: Diagnosis not present

## 2016-10-22 DIAGNOSIS — C051 Malignant neoplasm of soft palate: Secondary | ICD-10-CM | POA: Diagnosis not present

## 2016-10-22 DIAGNOSIS — M6281 Muscle weakness (generalized): Secondary | ICD-10-CM | POA: Diagnosis not present

## 2016-10-22 DIAGNOSIS — Z431 Encounter for attention to gastrostomy: Secondary | ICD-10-CM | POA: Diagnosis not present

## 2016-10-29 DIAGNOSIS — C140 Malignant neoplasm of pharynx, unspecified: Secondary | ICD-10-CM | POA: Diagnosis not present

## 2016-10-29 DIAGNOSIS — M6281 Muscle weakness (generalized): Secondary | ICD-10-CM | POA: Diagnosis not present

## 2016-10-29 DIAGNOSIS — E119 Type 2 diabetes mellitus without complications: Secondary | ICD-10-CM | POA: Diagnosis not present

## 2016-10-29 DIAGNOSIS — Z431 Encounter for attention to gastrostomy: Secondary | ICD-10-CM | POA: Diagnosis not present

## 2016-10-29 DIAGNOSIS — J209 Acute bronchitis, unspecified: Secondary | ICD-10-CM | POA: Diagnosis not present

## 2016-10-29 DIAGNOSIS — C051 Malignant neoplasm of soft palate: Secondary | ICD-10-CM | POA: Diagnosis not present

## 2016-11-07 DIAGNOSIS — C099 Malignant neoplasm of tonsil, unspecified: Secondary | ICD-10-CM | POA: Diagnosis not present

## 2016-11-09 ENCOUNTER — Other Ambulatory Visit: Payer: Self-pay | Admitting: Family Medicine

## 2016-11-09 DIAGNOSIS — G4700 Insomnia, unspecified: Secondary | ICD-10-CM

## 2016-11-18 DIAGNOSIS — L089 Local infection of the skin and subcutaneous tissue, unspecified: Secondary | ICD-10-CM | POA: Diagnosis not present

## 2016-11-18 DIAGNOSIS — L0889 Other specified local infections of the skin and subcutaneous tissue: Secondary | ICD-10-CM | POA: Diagnosis not present

## 2016-11-18 DIAGNOSIS — I10 Essential (primary) hypertension: Secondary | ICD-10-CM | POA: Diagnosis not present

## 2016-11-18 DIAGNOSIS — D509 Iron deficiency anemia, unspecified: Secondary | ICD-10-CM | POA: Diagnosis not present

## 2016-11-18 DIAGNOSIS — C140 Malignant neoplasm of pharynx, unspecified: Secondary | ICD-10-CM | POA: Diagnosis not present

## 2016-11-18 DIAGNOSIS — R32 Unspecified urinary incontinence: Secondary | ICD-10-CM | POA: Diagnosis not present

## 2016-11-25 DIAGNOSIS — C01 Malignant neoplasm of base of tongue: Secondary | ICD-10-CM | POA: Diagnosis not present

## 2016-12-23 DIAGNOSIS — C01 Malignant neoplasm of base of tongue: Secondary | ICD-10-CM | POA: Diagnosis not present

## 2016-12-23 DIAGNOSIS — C029 Malignant neoplasm of tongue, unspecified: Secondary | ICD-10-CM | POA: Diagnosis not present

## 2016-12-23 DIAGNOSIS — M1712 Unilateral primary osteoarthritis, left knee: Secondary | ICD-10-CM | POA: Diagnosis not present

## 2016-12-23 DIAGNOSIS — Z96651 Presence of right artificial knee joint: Secondary | ICD-10-CM | POA: Diagnosis not present

## 2017-01-05 DIAGNOSIS — M545 Low back pain: Secondary | ICD-10-CM | POA: Diagnosis not present

## 2017-01-05 DIAGNOSIS — C140 Malignant neoplasm of pharynx, unspecified: Secondary | ICD-10-CM | POA: Diagnosis not present

## 2017-01-05 DIAGNOSIS — Z87891 Personal history of nicotine dependence: Secondary | ICD-10-CM | POA: Diagnosis not present

## 2017-01-05 DIAGNOSIS — Z7984 Long term (current) use of oral hypoglycemic drugs: Secondary | ICD-10-CM | POA: Diagnosis not present

## 2017-01-05 DIAGNOSIS — Z7982 Long term (current) use of aspirin: Secondary | ICD-10-CM | POA: Diagnosis not present

## 2017-01-05 DIAGNOSIS — Z9049 Acquired absence of other specified parts of digestive tract: Secondary | ICD-10-CM | POA: Diagnosis not present

## 2017-01-05 DIAGNOSIS — G894 Chronic pain syndrome: Secondary | ICD-10-CM | POA: Diagnosis not present

## 2017-01-05 DIAGNOSIS — Z79899 Other long term (current) drug therapy: Secondary | ICD-10-CM | POA: Diagnosis not present

## 2017-01-05 DIAGNOSIS — Z880 Allergy status to penicillin: Secondary | ICD-10-CM | POA: Diagnosis not present

## 2017-01-14 DIAGNOSIS — E119 Type 2 diabetes mellitus without complications: Secondary | ICD-10-CM | POA: Diagnosis not present

## 2017-01-14 DIAGNOSIS — M549 Dorsalgia, unspecified: Secondary | ICD-10-CM | POA: Diagnosis not present

## 2017-01-14 DIAGNOSIS — Z931 Gastrostomy status: Secondary | ICD-10-CM | POA: Diagnosis not present

## 2017-01-14 DIAGNOSIS — G8929 Other chronic pain: Secondary | ICD-10-CM | POA: Diagnosis not present

## 2017-01-27 DIAGNOSIS — M1712 Unilateral primary osteoarthritis, left knee: Secondary | ICD-10-CM | POA: Diagnosis not present

## 2017-01-27 DIAGNOSIS — G894 Chronic pain syndrome: Secondary | ICD-10-CM | POA: Diagnosis not present

## 2017-01-27 DIAGNOSIS — C140 Malignant neoplasm of pharynx, unspecified: Secondary | ICD-10-CM | POA: Diagnosis not present

## 2017-01-27 DIAGNOSIS — Z87891 Personal history of nicotine dependence: Secondary | ICD-10-CM | POA: Diagnosis not present

## 2017-01-27 DIAGNOSIS — M545 Low back pain: Secondary | ICD-10-CM | POA: Diagnosis not present

## 2017-01-27 DIAGNOSIS — M25552 Pain in left hip: Secondary | ICD-10-CM | POA: Diagnosis not present

## 2017-01-27 DIAGNOSIS — Z7982 Long term (current) use of aspirin: Secondary | ICD-10-CM | POA: Diagnosis not present

## 2017-01-27 DIAGNOSIS — Z79891 Long term (current) use of opiate analgesic: Secondary | ICD-10-CM | POA: Diagnosis not present

## 2017-01-27 DIAGNOSIS — M47816 Spondylosis without myelopathy or radiculopathy, lumbar region: Secondary | ICD-10-CM | POA: Diagnosis not present

## 2017-02-03 DIAGNOSIS — C01 Malignant neoplasm of base of tongue: Secondary | ICD-10-CM | POA: Diagnosis not present

## 2017-02-04 ENCOUNTER — Other Ambulatory Visit: Payer: Self-pay | Admitting: Family Medicine

## 2017-02-12 DIAGNOSIS — R109 Unspecified abdominal pain: Secondary | ICD-10-CM | POA: Diagnosis not present

## 2017-02-12 DIAGNOSIS — E119 Type 2 diabetes mellitus without complications: Secondary | ICD-10-CM | POA: Diagnosis not present

## 2017-02-12 DIAGNOSIS — Z931 Gastrostomy status: Secondary | ICD-10-CM | POA: Diagnosis not present

## 2017-02-12 DIAGNOSIS — I10 Essential (primary) hypertension: Secondary | ICD-10-CM | POA: Diagnosis not present

## 2017-02-17 DIAGNOSIS — Z87891 Personal history of nicotine dependence: Secondary | ICD-10-CM | POA: Diagnosis not present

## 2017-02-17 DIAGNOSIS — C01 Malignant neoplasm of base of tongue: Secondary | ICD-10-CM | POA: Diagnosis not present

## 2017-02-17 DIAGNOSIS — Z8589 Personal history of malignant neoplasm of other organs and systems: Secondary | ICD-10-CM | POA: Diagnosis not present

## 2017-02-17 DIAGNOSIS — R1314 Dysphagia, pharyngoesophageal phase: Secondary | ICD-10-CM | POA: Diagnosis not present

## 2017-03-09 DIAGNOSIS — G894 Chronic pain syndrome: Secondary | ICD-10-CM | POA: Diagnosis not present

## 2017-03-09 DIAGNOSIS — M1712 Unilateral primary osteoarthritis, left knee: Secondary | ICD-10-CM | POA: Diagnosis not present

## 2017-03-09 DIAGNOSIS — M47816 Spondylosis without myelopathy or radiculopathy, lumbar region: Secondary | ICD-10-CM | POA: Diagnosis not present

## 2017-03-09 DIAGNOSIS — M545 Low back pain: Secondary | ICD-10-CM | POA: Diagnosis not present

## 2017-03-09 DIAGNOSIS — Z79891 Long term (current) use of opiate analgesic: Secondary | ICD-10-CM | POA: Diagnosis not present

## 2017-03-09 DIAGNOSIS — Z79899 Other long term (current) drug therapy: Secondary | ICD-10-CM | POA: Diagnosis not present

## 2017-03-09 DIAGNOSIS — M25552 Pain in left hip: Secondary | ICD-10-CM | POA: Diagnosis not present

## 2017-03-09 DIAGNOSIS — Z7982 Long term (current) use of aspirin: Secondary | ICD-10-CM | POA: Diagnosis not present

## 2017-03-09 DIAGNOSIS — C140 Malignant neoplasm of pharynx, unspecified: Secondary | ICD-10-CM | POA: Diagnosis not present

## 2017-03-09 DIAGNOSIS — Z880 Allergy status to penicillin: Secondary | ICD-10-CM | POA: Diagnosis not present

## 2017-03-09 DIAGNOSIS — G580 Intercostal neuropathy: Secondary | ICD-10-CM | POA: Diagnosis not present

## 2017-04-08 DIAGNOSIS — Z87891 Personal history of nicotine dependence: Secondary | ICD-10-CM | POA: Diagnosis not present

## 2017-04-08 DIAGNOSIS — G580 Intercostal neuropathy: Secondary | ICD-10-CM | POA: Diagnosis not present

## 2017-04-08 DIAGNOSIS — G588 Other specified mononeuropathies: Secondary | ICD-10-CM | POA: Diagnosis not present

## 2017-04-08 DIAGNOSIS — M47816 Spondylosis without myelopathy or radiculopathy, lumbar region: Secondary | ICD-10-CM | POA: Diagnosis not present

## 2017-04-08 DIAGNOSIS — Z794 Long term (current) use of insulin: Secondary | ICD-10-CM | POA: Diagnosis not present

## 2017-04-08 DIAGNOSIS — Z880 Allergy status to penicillin: Secondary | ICD-10-CM | POA: Diagnosis not present

## 2017-04-08 DIAGNOSIS — E1140 Type 2 diabetes mellitus with diabetic neuropathy, unspecified: Secondary | ICD-10-CM | POA: Diagnosis not present

## 2017-04-08 DIAGNOSIS — G894 Chronic pain syndrome: Secondary | ICD-10-CM | POA: Diagnosis not present

## 2017-04-08 DIAGNOSIS — Z79891 Long term (current) use of opiate analgesic: Secondary | ICD-10-CM | POA: Diagnosis not present

## 2017-04-08 DIAGNOSIS — M1712 Unilateral primary osteoarthritis, left knee: Secondary | ICD-10-CM | POA: Diagnosis not present

## 2017-05-04 ENCOUNTER — Other Ambulatory Visit: Payer: Self-pay | Admitting: Family Medicine

## 2017-05-04 DIAGNOSIS — G4700 Insomnia, unspecified: Secondary | ICD-10-CM

## 2017-05-05 DIAGNOSIS — N951 Menopausal and female climacteric states: Secondary | ICD-10-CM | POA: Diagnosis not present

## 2017-05-05 DIAGNOSIS — R32 Unspecified urinary incontinence: Secondary | ICD-10-CM | POA: Diagnosis not present

## 2017-05-07 DIAGNOSIS — E1140 Type 2 diabetes mellitus with diabetic neuropathy, unspecified: Secondary | ICD-10-CM | POA: Diagnosis not present

## 2017-05-07 DIAGNOSIS — Z79891 Long term (current) use of opiate analgesic: Secondary | ICD-10-CM | POA: Diagnosis not present

## 2017-05-07 DIAGNOSIS — I10 Essential (primary) hypertension: Secondary | ICD-10-CM | POA: Diagnosis not present

## 2017-05-07 DIAGNOSIS — G580 Intercostal neuropathy: Secondary | ICD-10-CM | POA: Diagnosis not present

## 2017-05-07 DIAGNOSIS — C140 Malignant neoplasm of pharynx, unspecified: Secondary | ICD-10-CM | POA: Diagnosis not present

## 2017-05-07 DIAGNOSIS — Z931 Gastrostomy status: Secondary | ICD-10-CM | POA: Diagnosis not present

## 2017-05-07 DIAGNOSIS — M47816 Spondylosis without myelopathy or radiculopathy, lumbar region: Secondary | ICD-10-CM | POA: Diagnosis not present

## 2017-05-07 DIAGNOSIS — M25552 Pain in left hip: Secondary | ICD-10-CM | POA: Diagnosis not present

## 2017-05-07 DIAGNOSIS — Z87891 Personal history of nicotine dependence: Secondary | ICD-10-CM | POA: Diagnosis not present

## 2017-05-07 DIAGNOSIS — G894 Chronic pain syndrome: Secondary | ICD-10-CM | POA: Diagnosis not present

## 2017-05-07 DIAGNOSIS — R109 Unspecified abdominal pain: Secondary | ICD-10-CM | POA: Diagnosis not present

## 2017-05-07 DIAGNOSIS — M545 Low back pain: Secondary | ICD-10-CM | POA: Diagnosis not present

## 2017-05-07 DIAGNOSIS — M1712 Unilateral primary osteoarthritis, left knee: Secondary | ICD-10-CM | POA: Diagnosis not present

## 2017-05-14 DIAGNOSIS — M2578 Osteophyte, vertebrae: Secondary | ICD-10-CM | POA: Diagnosis not present

## 2017-05-14 DIAGNOSIS — M4804 Spinal stenosis, thoracic region: Secondary | ICD-10-CM | POA: Diagnosis not present

## 2017-05-14 DIAGNOSIS — M9971 Connective tissue and disc stenosis of intervertebral foramina of cervical region: Secondary | ICD-10-CM | POA: Diagnosis not present

## 2017-05-14 DIAGNOSIS — G9589 Other specified diseases of spinal cord: Secondary | ICD-10-CM | POA: Diagnosis not present

## 2017-05-14 DIAGNOSIS — M47812 Spondylosis without myelopathy or radiculopathy, cervical region: Secondary | ICD-10-CM | POA: Diagnosis not present

## 2017-05-14 DIAGNOSIS — M5124 Other intervertebral disc displacement, thoracic region: Secondary | ICD-10-CM | POA: Diagnosis not present

## 2017-06-09 DIAGNOSIS — G894 Chronic pain syndrome: Secondary | ICD-10-CM | POA: Diagnosis not present

## 2017-06-09 DIAGNOSIS — M4804 Spinal stenosis, thoracic region: Secondary | ICD-10-CM | POA: Diagnosis not present

## 2017-06-09 DIAGNOSIS — M25552 Pain in left hip: Secondary | ICD-10-CM | POA: Diagnosis not present

## 2017-06-09 DIAGNOSIS — I10 Essential (primary) hypertension: Secondary | ICD-10-CM | POA: Diagnosis not present

## 2017-06-09 DIAGNOSIS — M545 Low back pain: Secondary | ICD-10-CM | POA: Diagnosis not present

## 2017-06-09 DIAGNOSIS — C140 Malignant neoplasm of pharynx, unspecified: Secondary | ICD-10-CM | POA: Diagnosis not present

## 2017-06-09 DIAGNOSIS — E1140 Type 2 diabetes mellitus with diabetic neuropathy, unspecified: Secondary | ICD-10-CM | POA: Diagnosis not present

## 2017-06-09 DIAGNOSIS — R109 Unspecified abdominal pain: Secondary | ICD-10-CM | POA: Diagnosis not present

## 2017-06-09 DIAGNOSIS — Z79891 Long term (current) use of opiate analgesic: Secondary | ICD-10-CM | POA: Diagnosis not present

## 2017-06-09 DIAGNOSIS — L03119 Cellulitis of unspecified part of limb: Secondary | ICD-10-CM | POA: Diagnosis not present

## 2017-06-09 DIAGNOSIS — Z87891 Personal history of nicotine dependence: Secondary | ICD-10-CM | POA: Diagnosis not present

## 2017-06-09 DIAGNOSIS — L03116 Cellulitis of left lower limb: Secondary | ICD-10-CM | POA: Diagnosis not present

## 2017-06-09 DIAGNOSIS — M1712 Unilateral primary osteoarthritis, left knee: Secondary | ICD-10-CM | POA: Diagnosis not present

## 2017-06-09 DIAGNOSIS — L03115 Cellulitis of right lower limb: Secondary | ICD-10-CM | POA: Diagnosis not present

## 2017-06-09 DIAGNOSIS — G580 Intercostal neuropathy: Secondary | ICD-10-CM | POA: Diagnosis not present

## 2017-06-12 DIAGNOSIS — F329 Major depressive disorder, single episode, unspecified: Secondary | ICD-10-CM | POA: Diagnosis not present

## 2017-06-12 DIAGNOSIS — L03115 Cellulitis of right lower limb: Secondary | ICD-10-CM | POA: Diagnosis not present

## 2017-06-12 DIAGNOSIS — L03116 Cellulitis of left lower limb: Secondary | ICD-10-CM | POA: Diagnosis not present

## 2017-06-12 DIAGNOSIS — Z87891 Personal history of nicotine dependence: Secondary | ICD-10-CM | POA: Diagnosis not present

## 2017-06-12 DIAGNOSIS — I10 Essential (primary) hypertension: Secondary | ICD-10-CM | POA: Diagnosis not present

## 2017-06-12 DIAGNOSIS — L0390 Cellulitis, unspecified: Secondary | ICD-10-CM | POA: Diagnosis not present

## 2017-06-17 DIAGNOSIS — Z9049 Acquired absence of other specified parts of digestive tract: Secondary | ICD-10-CM | POA: Diagnosis not present

## 2017-06-17 DIAGNOSIS — L03119 Cellulitis of unspecified part of limb: Secondary | ICD-10-CM | POA: Diagnosis not present

## 2017-06-17 DIAGNOSIS — E1142 Type 2 diabetes mellitus with diabetic polyneuropathy: Secondary | ICD-10-CM | POA: Diagnosis not present

## 2017-06-17 DIAGNOSIS — M1712 Unilateral primary osteoarthritis, left knee: Secondary | ICD-10-CM | POA: Diagnosis not present

## 2017-06-17 DIAGNOSIS — Z79891 Long term (current) use of opiate analgesic: Secondary | ICD-10-CM | POA: Diagnosis not present

## 2017-06-17 DIAGNOSIS — Z931 Gastrostomy status: Secondary | ICD-10-CM | POA: Diagnosis not present

## 2017-06-17 DIAGNOSIS — M25552 Pain in left hip: Secondary | ICD-10-CM | POA: Diagnosis not present

## 2017-06-17 DIAGNOSIS — Z87891 Personal history of nicotine dependence: Secondary | ICD-10-CM | POA: Diagnosis not present

## 2017-06-17 DIAGNOSIS — R109 Unspecified abdominal pain: Secondary | ICD-10-CM | POA: Diagnosis not present

## 2017-06-17 DIAGNOSIS — Z7982 Long term (current) use of aspirin: Secondary | ICD-10-CM | POA: Diagnosis not present

## 2017-06-17 DIAGNOSIS — M545 Low back pain: Secondary | ICD-10-CM | POA: Diagnosis not present

## 2017-06-17 DIAGNOSIS — M4804 Spinal stenosis, thoracic region: Secondary | ICD-10-CM | POA: Diagnosis not present

## 2017-06-17 DIAGNOSIS — C140 Malignant neoplasm of pharynx, unspecified: Secondary | ICD-10-CM | POA: Diagnosis not present

## 2017-06-17 DIAGNOSIS — M5136 Other intervertebral disc degeneration, lumbar region: Secondary | ICD-10-CM | POA: Diagnosis not present

## 2017-06-17 DIAGNOSIS — G894 Chronic pain syndrome: Secondary | ICD-10-CM | POA: Diagnosis not present

## 2017-06-17 DIAGNOSIS — G580 Intercostal neuropathy: Secondary | ICD-10-CM | POA: Diagnosis not present

## 2017-07-20 ENCOUNTER — Other Ambulatory Visit: Payer: Self-pay

## 2017-08-02 ENCOUNTER — Other Ambulatory Visit: Payer: Self-pay | Admitting: Family Medicine

## 2017-09-03 DIAGNOSIS — M1712 Unilateral primary osteoarthritis, left knee: Secondary | ICD-10-CM | POA: Diagnosis not present

## 2017-09-03 DIAGNOSIS — Z87891 Personal history of nicotine dependence: Secondary | ICD-10-CM | POA: Diagnosis not present

## 2017-09-03 DIAGNOSIS — M25552 Pain in left hip: Secondary | ICD-10-CM | POA: Diagnosis not present

## 2017-09-03 DIAGNOSIS — R109 Unspecified abdominal pain: Secondary | ICD-10-CM | POA: Diagnosis not present

## 2017-09-03 DIAGNOSIS — I10 Essential (primary) hypertension: Secondary | ICD-10-CM | POA: Diagnosis not present

## 2017-09-03 DIAGNOSIS — Z79891 Long term (current) use of opiate analgesic: Secondary | ICD-10-CM | POA: Diagnosis not present

## 2017-09-03 DIAGNOSIS — M4804 Spinal stenosis, thoracic region: Secondary | ICD-10-CM | POA: Diagnosis not present

## 2017-09-03 DIAGNOSIS — C140 Malignant neoplasm of pharynx, unspecified: Secondary | ICD-10-CM | POA: Diagnosis not present

## 2017-09-03 DIAGNOSIS — G580 Intercostal neuropathy: Secondary | ICD-10-CM | POA: Diagnosis not present

## 2017-09-03 DIAGNOSIS — M545 Low back pain: Secondary | ICD-10-CM | POA: Diagnosis not present

## 2017-09-03 DIAGNOSIS — E1140 Type 2 diabetes mellitus with diabetic neuropathy, unspecified: Secondary | ICD-10-CM | POA: Diagnosis not present

## 2017-09-03 DIAGNOSIS — G894 Chronic pain syndrome: Secondary | ICD-10-CM | POA: Diagnosis not present

## 2017-11-01 ENCOUNTER — Other Ambulatory Visit: Payer: Self-pay | Admitting: Family Medicine

## 2017-11-01 DIAGNOSIS — G4700 Insomnia, unspecified: Secondary | ICD-10-CM

## 2017-11-24 ENCOUNTER — Encounter: Payer: Self-pay | Admitting: Family Medicine

## 2019-03-18 ENCOUNTER — Encounter (INDEPENDENT_AMBULATORY_CARE_PROVIDER_SITE_OTHER): Payer: Self-pay | Admitting: *Deleted
